# Patient Record
Sex: Female | Born: 1955
Health system: Southern US, Community
[De-identification: ages and names within clinical notes are randomized; demographics above are authoritative.]

## PROBLEM LIST (undated history)

## (undated) DIAGNOSIS — K760 Fatty (change of) liver, not elsewhere classified: Secondary | ICD-10-CM

## (undated) DIAGNOSIS — H269 Unspecified cataract: Secondary | ICD-10-CM

## (undated) DIAGNOSIS — I10 Essential (primary) hypertension: Secondary | ICD-10-CM

## (undated) DIAGNOSIS — E559 Vitamin D deficiency, unspecified: Secondary | ICD-10-CM

## (undated) DIAGNOSIS — K219 Gastro-esophageal reflux disease without esophagitis: Secondary | ICD-10-CM

## (undated) DIAGNOSIS — T8859XA Other complications of anesthesia, initial encounter: Secondary | ICD-10-CM

## (undated) DIAGNOSIS — C801 Malignant (primary) neoplasm, unspecified: Secondary | ICD-10-CM

## (undated) DIAGNOSIS — E7800 Pure hypercholesterolemia, unspecified: Secondary | ICD-10-CM

## (undated) DIAGNOSIS — T4145XA Adverse effect of unspecified anesthetic, initial encounter: Secondary | ICD-10-CM

## (undated) DIAGNOSIS — M1990 Unspecified osteoarthritis, unspecified site: Secondary | ICD-10-CM

## (undated) HISTORY — PX: HERNIA REPAIR: SHX51

## (undated) HISTORY — DX: Fatty (change of) liver, not elsewhere classified: K76.0

## (undated) HISTORY — DX: Unspecified cataract: H26.9

## (undated) HISTORY — DX: Malignant (primary) neoplasm, unspecified: C80.1

## (undated) HISTORY — DX: Vitamin D deficiency, unspecified: E55.9

## (undated) HISTORY — PX: WISDOM TOOTH EXTRACTION: SHX21

## (undated) HISTORY — DX: Essential (primary) hypertension: I10

## (undated) HISTORY — PX: TRAM: SHX5363

## (undated) HISTORY — PX: COLONOSCOPY: SHX174

---

## 1980-12-29 HISTORY — PX: KNEE ARTHROSCOPY: SUR90

## 1993-12-29 HISTORY — PX: MASTECTOMY: SHX3

## 1993-12-29 HISTORY — PX: BREAST SURGERY: SHX581

## 1998-03-14 ENCOUNTER — Ambulatory Visit (HOSPITAL_COMMUNITY): Admission: RE | Admit: 1998-03-14 | Discharge: 1998-03-14 | Payer: Self-pay | Admitting: *Deleted

## 1998-09-21 ENCOUNTER — Inpatient Hospital Stay (HOSPITAL_COMMUNITY): Admission: RE | Admit: 1998-09-21 | Discharge: 1998-09-25 | Payer: Self-pay | Admitting: Plastic Surgery

## 1999-05-01 ENCOUNTER — Inpatient Hospital Stay (HOSPITAL_COMMUNITY): Admission: RE | Admit: 1999-05-01 | Discharge: 1999-05-02 | Payer: Self-pay | Admitting: General Surgery

## 1999-07-22 ENCOUNTER — Ambulatory Visit (HOSPITAL_COMMUNITY): Admission: RE | Admit: 1999-07-22 | Discharge: 1999-07-22 | Payer: Self-pay | Admitting: General Surgery

## 1999-07-22 ENCOUNTER — Encounter: Payer: Self-pay | Admitting: General Surgery

## 1999-12-10 ENCOUNTER — Other Ambulatory Visit: Admission: RE | Admit: 1999-12-10 | Discharge: 1999-12-10 | Payer: Self-pay | Admitting: *Deleted

## 1999-12-18 ENCOUNTER — Encounter: Payer: Self-pay | Admitting: Internal Medicine

## 1999-12-18 ENCOUNTER — Ambulatory Visit (HOSPITAL_COMMUNITY): Admission: RE | Admit: 1999-12-18 | Discharge: 1999-12-18 | Payer: Self-pay | Admitting: Internal Medicine

## 1999-12-25 ENCOUNTER — Encounter: Payer: Self-pay | Admitting: Internal Medicine

## 1999-12-25 ENCOUNTER — Ambulatory Visit (HOSPITAL_COMMUNITY): Admission: RE | Admit: 1999-12-25 | Discharge: 1999-12-25 | Payer: Self-pay | Admitting: Internal Medicine

## 2000-06-24 ENCOUNTER — Encounter: Payer: Self-pay | Admitting: Internal Medicine

## 2000-06-24 ENCOUNTER — Encounter: Admission: RE | Admit: 2000-06-24 | Discharge: 2000-06-24 | Payer: Self-pay | Admitting: Internal Medicine

## 2000-12-23 ENCOUNTER — Other Ambulatory Visit: Admission: RE | Admit: 2000-12-23 | Discharge: 2000-12-23 | Payer: Self-pay | Admitting: *Deleted

## 2001-06-25 ENCOUNTER — Encounter: Payer: Self-pay | Admitting: Internal Medicine

## 2001-06-25 ENCOUNTER — Encounter: Admission: RE | Admit: 2001-06-25 | Discharge: 2001-06-25 | Payer: Self-pay | Admitting: Internal Medicine

## 2001-08-21 ENCOUNTER — Emergency Department (HOSPITAL_COMMUNITY): Admission: EM | Admit: 2001-08-21 | Discharge: 2001-08-21 | Payer: Self-pay | Admitting: Internal Medicine

## 2001-10-31 ENCOUNTER — Emergency Department (HOSPITAL_COMMUNITY): Admission: EM | Admit: 2001-10-31 | Discharge: 2001-10-31 | Payer: Self-pay | Admitting: Emergency Medicine

## 2001-10-31 ENCOUNTER — Encounter: Payer: Self-pay | Admitting: Emergency Medicine

## 2001-11-10 ENCOUNTER — Encounter: Admission: RE | Admit: 2001-11-10 | Discharge: 2001-12-27 | Payer: Self-pay | Admitting: Orthopedic Surgery

## 2002-06-30 ENCOUNTER — Encounter: Admission: RE | Admit: 2002-06-30 | Discharge: 2002-06-30 | Payer: Self-pay | Admitting: *Deleted

## 2002-06-30 ENCOUNTER — Encounter: Payer: Self-pay | Admitting: *Deleted

## 2002-08-12 ENCOUNTER — Encounter: Payer: Self-pay | Admitting: Internal Medicine

## 2002-08-12 ENCOUNTER — Ambulatory Visit (HOSPITAL_COMMUNITY): Admission: RE | Admit: 2002-08-12 | Discharge: 2002-08-12 | Payer: Self-pay | Admitting: Internal Medicine

## 2003-02-20 ENCOUNTER — Encounter: Admission: RE | Admit: 2003-02-20 | Discharge: 2003-02-20 | Payer: Self-pay | Admitting: Obstetrics and Gynecology

## 2003-02-20 ENCOUNTER — Encounter: Payer: Self-pay | Admitting: Obstetrics and Gynecology

## 2003-07-04 ENCOUNTER — Encounter: Payer: Self-pay | Admitting: General Surgery

## 2003-07-04 ENCOUNTER — Encounter: Admission: RE | Admit: 2003-07-04 | Discharge: 2003-07-04 | Payer: Self-pay | Admitting: General Surgery

## 2003-10-23 ENCOUNTER — Ambulatory Visit (HOSPITAL_COMMUNITY): Admission: RE | Admit: 2003-10-23 | Discharge: 2003-10-23 | Payer: Self-pay | Admitting: Internal Medicine

## 2003-10-23 ENCOUNTER — Encounter: Payer: Self-pay | Admitting: Internal Medicine

## 2004-04-08 ENCOUNTER — Ambulatory Visit (HOSPITAL_COMMUNITY): Admission: RE | Admit: 2004-04-08 | Discharge: 2004-04-08 | Payer: Self-pay | Admitting: Internal Medicine

## 2004-07-16 ENCOUNTER — Encounter: Admission: RE | Admit: 2004-07-16 | Discharge: 2004-07-16 | Payer: Self-pay | Admitting: Internal Medicine

## 2005-07-17 ENCOUNTER — Encounter: Admission: RE | Admit: 2005-07-17 | Discharge: 2005-07-17 | Payer: Self-pay | Admitting: Internal Medicine

## 2005-07-17 ENCOUNTER — Ambulatory Visit (HOSPITAL_COMMUNITY): Admission: RE | Admit: 2005-07-17 | Discharge: 2005-07-17 | Payer: Self-pay | Admitting: Internal Medicine

## 2006-01-27 ENCOUNTER — Other Ambulatory Visit: Admission: RE | Admit: 2006-01-27 | Discharge: 2006-01-27 | Payer: Self-pay | Admitting: Obstetrics and Gynecology

## 2006-07-20 ENCOUNTER — Encounter: Admission: RE | Admit: 2006-07-20 | Discharge: 2006-07-20 | Payer: Self-pay | Admitting: Plastic Surgery

## 2006-12-28 ENCOUNTER — Ambulatory Visit (HOSPITAL_COMMUNITY): Admission: RE | Admit: 2006-12-28 | Discharge: 2006-12-28 | Payer: Self-pay | Admitting: Internal Medicine

## 2007-07-22 ENCOUNTER — Encounter: Admission: RE | Admit: 2007-07-22 | Discharge: 2007-07-22 | Payer: Self-pay | Admitting: Internal Medicine

## 2008-07-24 ENCOUNTER — Encounter: Admission: RE | Admit: 2008-07-24 | Discharge: 2008-07-24 | Payer: Self-pay | Admitting: General Surgery

## 2009-05-30 ENCOUNTER — Ambulatory Visit (HOSPITAL_COMMUNITY): Admission: RE | Admit: 2009-05-30 | Discharge: 2009-05-30 | Payer: Self-pay | Admitting: Obstetrics and Gynecology

## 2009-07-25 ENCOUNTER — Encounter: Admission: RE | Admit: 2009-07-25 | Discharge: 2009-07-25 | Payer: Self-pay | Admitting: Internal Medicine

## 2010-07-29 ENCOUNTER — Encounter: Admission: RE | Admit: 2010-07-29 | Discharge: 2010-07-29 | Payer: Self-pay | Admitting: Internal Medicine

## 2011-02-10 ENCOUNTER — Other Ambulatory Visit (HOSPITAL_COMMUNITY): Payer: Self-pay | Admitting: Internal Medicine

## 2011-02-10 ENCOUNTER — Ambulatory Visit (HOSPITAL_COMMUNITY)
Admission: RE | Admit: 2011-02-10 | Discharge: 2011-02-10 | Disposition: A | Discharge status: Home / Self Care | Payer: BC Managed Care – PPO | Source: Ambulatory Visit | Attending: Internal Medicine | Admitting: Internal Medicine

## 2011-02-10 DIAGNOSIS — M25519 Pain in unspecified shoulder: Secondary | ICD-10-CM

## 2011-02-10 DIAGNOSIS — Z853 Personal history of malignant neoplasm of breast: Secondary | ICD-10-CM

## 2011-02-10 DIAGNOSIS — Z0000 Encounter for general adult medical examination without abnormal findings: Secondary | ICD-10-CM | POA: Insufficient documentation

## 2011-04-29 LAB — HM COLONOSCOPY

## 2011-07-29 ENCOUNTER — Other Ambulatory Visit: Payer: Self-pay | Admitting: Obstetrics and Gynecology

## 2011-07-29 DIAGNOSIS — Z1231 Encounter for screening mammogram for malignant neoplasm of breast: Secondary | ICD-10-CM

## 2011-08-05 ENCOUNTER — Ambulatory Visit
Admission: RE | Admit: 2011-08-05 | Discharge: 2011-08-05 | Disposition: A | Discharge status: Home / Self Care | Payer: BC Managed Care – PPO | Source: Ambulatory Visit | Attending: Obstetrics and Gynecology | Admitting: Obstetrics and Gynecology

## 2011-08-05 DIAGNOSIS — Z1231 Encounter for screening mammogram for malignant neoplasm of breast: Secondary | ICD-10-CM

## 2012-03-16 ENCOUNTER — Other Ambulatory Visit: Payer: Self-pay | Admitting: Internal Medicine

## 2012-03-16 DIAGNOSIS — R1011 Right upper quadrant pain: Secondary | ICD-10-CM

## 2012-03-18 ENCOUNTER — Other Ambulatory Visit: Payer: Self-pay | Admitting: Internal Medicine

## 2012-03-18 ENCOUNTER — Ambulatory Visit
Admission: RE | Admit: 2012-03-18 | Discharge: 2012-03-18 | Disposition: A | Discharge status: Home / Self Care | Payer: BC Managed Care – PPO | Source: Ambulatory Visit | Attending: Internal Medicine | Admitting: Internal Medicine

## 2012-03-18 DIAGNOSIS — R1011 Right upper quadrant pain: Secondary | ICD-10-CM

## 2012-03-18 DIAGNOSIS — R52 Pain, unspecified: Secondary | ICD-10-CM

## 2012-07-12 ENCOUNTER — Other Ambulatory Visit: Payer: Self-pay | Admitting: Obstetrics and Gynecology

## 2012-07-12 DIAGNOSIS — Z1231 Encounter for screening mammogram for malignant neoplasm of breast: Secondary | ICD-10-CM

## 2012-08-05 ENCOUNTER — Ambulatory Visit
Admission: RE | Admit: 2012-08-05 | Discharge: 2012-08-05 | Disposition: A | Discharge status: Home / Self Care | Payer: BC Managed Care – PPO | Source: Ambulatory Visit | Attending: Obstetrics and Gynecology | Admitting: Obstetrics and Gynecology

## 2012-08-05 DIAGNOSIS — Z1231 Encounter for screening mammogram for malignant neoplasm of breast: Secondary | ICD-10-CM

## 2012-10-04 ENCOUNTER — Encounter (HOSPITAL_COMMUNITY): Payer: Self-pay | Admitting: Emergency Medicine

## 2012-10-04 ENCOUNTER — Emergency Department (HOSPITAL_COMMUNITY)
Admission: EM | Admit: 2012-10-04 | Discharge: 2012-10-05 | Disposition: A | Discharge status: Home / Self Care | Payer: BC Managed Care – PPO | Attending: Emergency Medicine | Admitting: Emergency Medicine

## 2012-10-04 ENCOUNTER — Emergency Department (HOSPITAL_COMMUNITY): Payer: BC Managed Care – PPO

## 2012-10-04 DIAGNOSIS — K8020 Calculus of gallbladder without cholecystitis without obstruction: Secondary | ICD-10-CM | POA: Insufficient documentation

## 2012-10-04 DIAGNOSIS — R5383 Other fatigue: Secondary | ICD-10-CM | POA: Insufficient documentation

## 2012-10-04 DIAGNOSIS — R5381 Other malaise: Secondary | ICD-10-CM | POA: Insufficient documentation

## 2012-10-04 DIAGNOSIS — R6883 Chills (without fever): Secondary | ICD-10-CM | POA: Insufficient documentation

## 2012-10-04 DIAGNOSIS — R112 Nausea with vomiting, unspecified: Secondary | ICD-10-CM | POA: Insufficient documentation

## 2012-10-04 DIAGNOSIS — R109 Unspecified abdominal pain: Secondary | ICD-10-CM

## 2012-10-04 HISTORY — DX: Pure hypercholesterolemia, unspecified: E78.00

## 2012-10-04 HISTORY — DX: Gastro-esophageal reflux disease without esophagitis: K21.9

## 2012-10-04 LAB — CBC WITH DIFFERENTIAL/PLATELET
Basophils Absolute: 0 10*3/uL (ref 0.0–0.1)
Basophils Relative: 0 % (ref 0–1)
Eosinophils Absolute: 0.1 10*3/uL (ref 0.0–0.7)
Lymphocytes Relative: 13 % (ref 12–46)
MCV: 87.4 fL (ref 78.0–100.0)
Monocytes Absolute: 0.7 10*3/uL (ref 0.1–1.0)
Monocytes Relative: 6 % (ref 3–12)
Neutro Abs: 9.6 10*3/uL — ABNORMAL HIGH (ref 1.7–7.7)
Neutrophils Relative %: 79 % — ABNORMAL HIGH (ref 43–77)
Platelets: 236 10*3/uL (ref 150–400)
RBC: 4.28 MIL/uL (ref 3.87–5.11)

## 2012-10-04 LAB — COMPREHENSIVE METABOLIC PANEL
ALT: 37 U/L — ABNORMAL HIGH (ref 0–35)
AST: 60 U/L — ABNORMAL HIGH (ref 0–37)
Albumin: 4.2 g/dL (ref 3.5–5.2)
Alkaline Phosphatase: 63 U/L (ref 39–117)
GFR calc non Af Amer: 74 mL/min — ABNORMAL LOW (ref >90)
Total Bilirubin: 0.4 mg/dL (ref 0.3–1.2)
Total Protein: 7.5 g/dL (ref 6.0–8.3)

## 2012-10-04 LAB — URINALYSIS, ROUTINE W REFLEX MICROSCOPIC
Bilirubin Urine: NEGATIVE
Glucose, UA: NEGATIVE mg/dL
Hgb urine dipstick: NEGATIVE
Ketones, ur: NEGATIVE mg/dL
Nitrite: NEGATIVE
Specific Gravity, Urine: 1.017 (ref 1.005–1.030)
pH: 7 (ref 5.0–8.0)

## 2012-10-04 LAB — LACTIC ACID, PLASMA: Lactic Acid, Venous: 1.5 mmol/L (ref 0.5–2.2)

## 2012-10-04 LAB — URINE MICROSCOPIC-ADD ON

## 2012-10-04 MED ORDER — ONDANSETRON HCL 4 MG/2ML IJ SOLN
4.0000 mg | Freq: Once | INTRAMUSCULAR | Status: DC
  Filled 2012-10-04: qty 2

## 2012-10-04 MED ORDER — MORPHINE SULFATE 4 MG/ML IJ SOLN
4.0000 mg | Freq: Once | INTRAMUSCULAR | Status: DC
  Filled 2012-10-04: qty 1

## 2012-10-04 MED ORDER — PANTOPRAZOLE SODIUM 40 MG IV SOLR
40.0000 mg | Freq: Once | INTRAVENOUS | Status: AC
  Administered 2012-10-04: 40 mg via INTRAVENOUS
  Filled 2012-10-04: qty 40

## 2012-10-04 MED ORDER — OXYCODONE-ACETAMINOPHEN 5-325 MG PO TABS: 2.0000 | ORAL_TABLET | ORAL | Status: DC | PRN

## 2012-10-04 MED ORDER — IOHEXOL 300 MG/ML  SOLN
100.0000 mL | Freq: Once | INTRAMUSCULAR | Status: AC | PRN
  Administered 2012-10-04: 100 mL via INTRAVENOUS

## 2012-10-04 NOTE — ED Notes (Signed)
Patient declined ondansetron and morphine at this time.  I advised her to let me know if she changes her mind.

## 2012-10-04 NOTE — ED Notes (Signed)
Unable to start IV by several nurses.   IV team notified.  Moving patient to CDU 9  Contrast completed CT not notified of contrast completion.

## 2012-10-04 NOTE — ED Provider Notes (Signed)
11:38 PM Patient sent to the CDU pending abdominal CT results by Dr. Bebe Shaggy. Patient's CT abdomen and pelvis is negative for any acute process. Patient is not satisfied with the results and would like to go home. I will discharge her with instructions to follow up with her PCP or GI specialist as needed and pain medication.   Jenna Miller, [place] 10/04/12 2339

## 2012-10-04 NOTE — ED Provider Notes (Signed)
History     CSN: [id number]  Arrival date & time 10/04/12  1351   First MD Initiated Contact with Patient 10/04/12 1941     Chief complaint - abdominal pain  Patient is a 56 y.o. female presenting with abdominal pain. The history is provided by the patient.  Abdominal Pain The primary symptoms of the illness include abdominal pain, fatigue and nausea. The primary symptoms of the illness do not include fever, vomiting, diarrhea or hematemesis. Episode onset: several hours ago. The onset of the illness was sudden. The problem has been gradually improving.  The illness is associated with eating. Additional symptoms associated with the illness include chills. Symptoms associated with the illness do not include constipation.  pt reports that she had sudden onset of abdominal pain/cramping earlier today, this occurred soon after eating a salad.  She reports this was severe and last for several hours and is now improving She reports having these episodes previously, but this was the worst episode She did have BM this morning but was small   No cp/sob reported No dysuria reported No back pain reported No focal weakness  Past Medical History  Diagnosis Date  . High cholesterol   . Acid reflux     Past surgical - reports multiple abdominal surgeries No family history on file.  History  Substance Use Topics  . Smoking status: Never Smoker   . Smokeless tobacco: Not on file  . Alcohol Use: Yes    OB History    Grav Para Term Preterm Abortions TAB SAB Ect Mult Living                  Review of Systems  Constitutional: Positive for chills and fatigue. Negative for fever.  Gastrointestinal: Positive for nausea and abdominal pain. Negative for vomiting, diarrhea, constipation and hematemesis.  All other systems reviewed and are negative.    Allergies  Lipitor  Home Medications   Current Outpatient Rx  Name Route Sig Dispense Refill  . EZETIMIBE 10 MG PO TABS Oral Take 10 mg  by mouth daily.    Marland Kitchen GEMFIBROZIL 600 MG PO TABS Oral Take 600 mg by mouth daily.    Marland Kitchen HYDROCHLOROTHIAZIDE 25 MG PO TABS Oral Take 25 mg by mouth daily.    Marland Kitchen PANTOPRAZOLE SODIUM 40 MG PO TBEC Oral Take 40 mg by mouth 2 (two) times daily.    . SUCRALFATE 1 G PO TABS Oral Take 1 g by mouth 4 (four) times daily.      BP 130/79  Pulse 65  Temp 97.5 F (36.4 C) (Oral)  Resp 18  SpO2 98%  LMP 06/10/2010 BP 133/63  Pulse 70  Temp 98.6 F (37 C) (Oral)  Resp 20  Ht 5' 1.5" (1.562 m)  Wt 178 lb (80.74 kg)  BMI 33.09 kg/m2  SpO2 99%  LMP 06/10/2010  Physical Exam CONSTITUTIONAL: Well developed/well nourished HEAD AND FACE: Normocephalic/atraumatic EYES: EOMI/PERRL, no icterus ENMT: Mucous membranes moist NECK: supple no meningeal signs SPINE:entire spine nontender CV: S1/S2 noted, no murmurs/rubs/gallops noted LUNGS: Lungs are clear to auscultation bilaterally, no apparent distress ABDOMEN: soft, diffuse tenderness, no rebound or guarding, tenderness is moderate GU:no cva tenderness NEURO: Pt is awake/alert, moves all extremitiesx4 EXTREMITIES: pulses normal, full ROM SKIN: warm, color normal PSYCH: no abnormalities of mood noted  ED Course  Procedures   Labs Reviewed  CBC WITH DIFFERENTIAL - Abnormal; Notable for the following:    WBC 12.1 (*)     Neutrophils  Relative 79 (*)     Neutro Abs 9.6 (*)     All other components within normal limits  COMPREHENSIVE METABOLIC PANEL - Abnormal; Notable for the following:    Sodium 134 (*)     Glucose, Bld 114 (*)     BUN 26 (*)     AST 60 (*)     ALT 37 (*)     GFR calc non Af Amer 74 (*)     GFR calc Af Amer 85 (*)     All other components within normal limits  LIPASE, BLOOD  LACTIC ACID, PLASMA  URINALYSIS, ROUTINE W REFLEX MICROSCOPIC   10:36 PM Pt with diffuse abd cramping that is improving but is still present.  She reports feeling distended as well.  CT imaging recommend given her history/exam.  Pt agreeable.    Pt  told me she had recent abdominal ultrasound that did not show gallbladder disease.  Her CT shows cholelithiasis without cholecystitis.  She is improved and can f/u with gen surgery as I suspect she had biliary colic that is improved.    MDM  Nursing notes including past medical history and social history reviewed and considered in documentation Labs/vital reviewed and considered        Date: 10/04/2012  Rate: 63  Rhythm: normal sinus rhythm  QRS Axis: normal  Intervals: normal  ST/T Wave abnormalities: nonspecific ST changes  Conduction Disutrbances:none     Joya Gaskins, MD 10/04/12 2350

## 2012-10-04 NOTE — ED Notes (Signed)
IV attempted x 4, IV team paged.

## 2012-10-04 NOTE — ED Notes (Signed)
Was having lunch  salad started to have cramps and felt upper abd pain  No n/v just feels like on fire on inside

## 2012-10-04 NOTE — ED Notes (Addendum)
Pt has expressed that she is very upset about waiting for 5 hours in the waiting room. sympathized with patient. sts that she doesn't want to get in a gown until she sees the doctor and knows a plan. Pt sitting in chair

## 2012-10-04 NOTE — ED Notes (Signed)
Pt informed of extended wait time. Pt not happy-requesting to speak with someone in charge. Andra, Pt advocate notified.
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
## 2012-10-05 NOTE — ED Notes (Signed)
The patient is AOx4 and comfortable with her discharge instructions. 

## 2012-10-07 NOTE — ED Provider Notes (Signed)
Medical screening examination/treatment/procedure(s) were conducted as a shared visit with non-physician practitioner(s) and myself.  I personally evaluated the patient during the encounter   Joya Gaskins, MD 10/07/12 0030

## 2012-10-20 ENCOUNTER — Encounter (INDEPENDENT_AMBULATORY_CARE_PROVIDER_SITE_OTHER): Payer: Self-pay | Admitting: General Surgery

## 2012-10-20 ENCOUNTER — Ambulatory Visit (INDEPENDENT_AMBULATORY_CARE_PROVIDER_SITE_OTHER): Payer: BC Managed Care – PPO | Admitting: General Surgery

## 2012-10-20 VITALS — BP 110/60 | HR 72 | Temp 96.9°F | Ht 61.5 in | Wt 177.2 lb

## 2012-10-20 DIAGNOSIS — K8020 Calculus of gallbladder without cholecystitis without obstruction: Secondary | ICD-10-CM

## 2012-10-20 NOTE — Progress Notes (Signed)
Patient ID: Jenna Miller, female   DOB: 01/24/1956, 56 y.o.   MRN: [id number]  Chief Complaint  Patient presents with  . Pre-op Exam    eval GB with stones    HPI Jenna Miller is a 56 y.o. female.  Chief complaint right upper quadrant abdominal pain HPI  Patient has had episodic right upper quadrant abdominal pain for approximately 6 months. She also had increased symptoms of reflux. Her gastroenterologist increased her Protonix dose. She had one severe attack of right upper quadrant pain earlier this month. She was evaluated at the emergency department with CT scan. This revealed gallstones but no evidence of cholecystitis at that time. Since then, she has been watching her diet closely and has avoided further attacks. When the attacks came previously they included right upper quadrant pain extending around to her back and also toward her epigastrium.  Past Medical History  Diagnosis Date  . High cholesterol   . Acid reflux   . Cancer     Past Surgical History  Procedure Date  . Breast surgery 1995    mastectomy left  . Tram 1999-2000  . Knee arthroscopy 1982  . Wisdom tooth extraction   Essure wire in fallopian tubes  Family History  Problem Relation Age of Onset  . Cancer Cousin     lung    Social History History  Substance Use Topics  . Smoking status: Never Smoker   . Smokeless tobacco: Not on file  . Alcohol Use: Yes     rare    Allergies  Allergen Reactions  . Lipitor (Atorvastatin)     Current Outpatient Prescriptions  Medication Sig Dispense Refill  . ezetimibe (ZETIA) 10 MG tablet Take 10 mg by mouth daily.      . gemfibrozil (LOPID) 600 MG tablet Take 600 mg by mouth daily.      . hydrochlorothiazide (HYDRODIURIL) 25 MG tablet Take 25 mg by mouth daily.      . oxyCODONE-acetaminophen (PERCOCET/ROXICET) 5-325 MG per tablet Take 2 tablets by mouth every 4 (four) hours as needed for pain.  15 tablet  0  . pantoprazole (PROTONIX) 40 MG tablet  Take 40 mg by mouth 2 (two) times daily.      . sucralfate (CARAFATE) 1 G tablet Take 1 g by mouth 4 (four) times daily.        Review of Systems Review of Systems  Constitutional: Negative for fever, chills and unexpected weight change.  HENT: Negative for hearing loss, congestion, sore throat, trouble swallowing and voice change.   Eyes: Negative for visual disturbance.  Respiratory: Negative for cough and wheezing.   Cardiovascular: Negative for chest pain, palpitations and leg swelling.  Gastrointestinal: Positive for abdominal pain. Negative for nausea, vomiting, diarrhea, constipation, blood in stool, abdominal distention and anal bleeding.       See history of present illness  Genitourinary: Negative for hematuria, vaginal bleeding and difficulty urinating.  Musculoskeletal: Negative for arthralgias.  Skin: Negative for rash and wound.  Neurological: Negative for seizures, syncope and headaches.  Hematological: Negative for adenopathy. Does not bruise/bleed easily.  Psychiatric/Behavioral: Negative for confusion.    Blood pressure 110/60, pulse 72, temperature 96.9 F (36.1 C), temperature source Temporal, height 5' 1.5" (1.562 m), weight 177 lb 3.2 oz (80.377 kg), last menstrual period 06/10/2010, SpO2 96.00%.  Physical Exam Physical Exam  Constitutional: She is oriented to person, place, and time. She appears well-developed and well-nourished.  HENT:  Head: Normocephalic and atraumatic.    Eyes: EOM are normal. Pupils are equal, round, and reactive to light. No scleral icterus.  Neck: Normal range of motion. Neck supple. No tracheal deviation present.  Cardiovascular: Normal rate, regular rhythm, normal heart sounds and intact distal pulses.   Pulmonary/Chest: Effort normal and breath sounds normal. No stridor. No respiratory distress. She has no wheezes. She has no rales.  Abdominal: Soft. Bowel sounds are normal. She exhibits no distension. There is no tenderness. There is  no rebound and no guarding.  Musculoskeletal: Normal range of motion.  Neurological: She is alert and oriented to person, place, and time.  Skin: Skin is warm and dry.    Data Reviewed Radiology reports  Assessment    Symptomatic cholelithiasis    Plan    I have offered laparoscopic cholecystectomy.I discussed the procedure in detail.  The patient was given educational material.  We discussed the risks and benefits of a laparoscopic cholecystectomy and possible cholangiogram including, but not limited to bleeding, infection, injury to surrounding structures such as the intestine or liver, bile leak, retained gallstones, need to convert to an open procedure, prolonged diarrhea, blood clots such as  DVT, common bile duct injury, anesthesia risks, and possible need for additional procedures.  The likelihood of improvement in symptoms and return to the patient's normal status is good. We discussed the typical post-operative recovery course.  [name] In light of her previous TRAM surgery. I will speak with him tomorrow and call her back. At that point she will proceed with scheduling.       Leesa Leifheit E 10/20/2012, 9:58 AM    

## 2012-10-21 ENCOUNTER — Other Ambulatory Visit (INDEPENDENT_AMBULATORY_CARE_PROVIDER_SITE_OTHER): Payer: Self-pay | Admitting: General Surgery

## 2012-10-22 ENCOUNTER — Telehealth: Payer: Self-pay | Admitting: General Surgery

## 2012-10-22 NOTE — Telephone Encounter (Signed)
I spoke to Dr. Odis Luster per patient's request and he feels lap chole should be no problem at this time.  I called the patient and she now wants to schedule for next month.

## 2012-11-10 ENCOUNTER — Encounter (HOSPITAL_COMMUNITY): Payer: Self-pay | Admitting: Pharmacy Technician

## 2012-11-10 NOTE — Pre-Procedure Instructions (Signed)
20 Veronique Kearley Gorney  11/10/2012   Your procedure is scheduled on:  Fri, Nov 22 @ 10:00 AM  Report to [facility] at 8:00 AM.  Call this number if you have problems the morning of surgery: [phone]   Remember:DO NOT EAT OR DRINK ANYTHING AFTER MIDNIGHT     Take these medicines the morning of surgery with A SIP OF WATER: Pantoprazole(Protonix)   Do not wear jewelry, make-up or nail polish.  Do not wear lotions, powders, or perfumes. You may wear deodorant.  Men may shave face and neck.  Do not bring valuables to the hospital.  Contacts, dentures or bridgework may not be worn into surgery.  Leave suitcase in the car. After surgery it may be brought to your room.  For patients admitted to the hospital, checkout time is 11:00 AM the day of discharge.   Patients discharged the day of surgery will not be allowed to drive home.    Special Instructions: Shower using CHG 2 nights before surgery and the night before surgery.  If you shower the day of surgery use CHG.  Use special wash - you have one bottle of CHG for all showers.  You should use approximately 1/3 of the bottle for each shower.   Please read over the following fact sheets that you were given: Pain Booklet, Coughing and Deep Breathing, MRSA Information and Surgical Site Infection Prevention

## 2012-11-11 ENCOUNTER — Encounter (HOSPITAL_COMMUNITY)
Admission: RE | Admit: 2012-11-11 | Discharge: 2012-11-11 | Disposition: A | Discharge status: Home / Self Care | Payer: BC Managed Care – PPO | Source: Ambulatory Visit | Attending: General Surgery | Admitting: General Surgery

## 2012-11-11 ENCOUNTER — Encounter (HOSPITAL_COMMUNITY): Payer: Self-pay

## 2012-11-11 HISTORY — DX: Adverse effect of unspecified anesthetic, initial encounter: T41.45XA

## 2012-11-11 HISTORY — DX: Other complications of anesthesia, initial encounter: T88.59XA

## 2012-11-11 HISTORY — DX: Unspecified osteoarthritis, unspecified site: M19.90

## 2012-11-11 LAB — CBC
Hemoglobin: 13 g/dL (ref 12.0–15.0)
MCH: 29.8 pg (ref 26.0–34.0)
MCV: 87.6 fL (ref 78.0–100.0)
Platelets: 281 10*3/uL (ref 150–400)
RBC: 4.36 MIL/uL (ref 3.87–5.11)
WBC: 5.1 10*3/uL (ref 4.0–10.5)

## 2012-11-11 LAB — COMPREHENSIVE METABOLIC PANEL
AST: 21 U/L (ref 0–37)
Albumin: 4.3 g/dL (ref 3.5–5.2)
BUN: 19 mg/dL (ref 6–23)
Calcium: 10.4 mg/dL (ref 8.4–10.5)
Chloride: 103 mEq/L (ref 96–112)
Creatinine, Ser: 0.87 mg/dL (ref 0.50–1.10)
Total Bilirubin: 0.4 mg/dL (ref 0.3–1.2)

## 2012-11-11 LAB — SURGICAL PCR SCREEN
MRSA, PCR: NEGATIVE
Staphylococcus aureus: NEGATIVE

## 2012-11-18 MED ORDER — CEFAZOLIN SODIUM-DEXTROSE 2-3 GM-% IV SOLR
2.0000 g | INTRAVENOUS | Status: AC
  Administered 2012-11-19: 2 g via INTRAVENOUS
  Filled 2012-11-18: qty 50

## 2012-11-19 ENCOUNTER — Ambulatory Visit (HOSPITAL_COMMUNITY): Payer: BC Managed Care – PPO

## 2012-11-19 ENCOUNTER — Encounter (HOSPITAL_COMMUNITY): Payer: Self-pay | Admitting: Anesthesiology

## 2012-11-19 ENCOUNTER — Ambulatory Visit (HOSPITAL_COMMUNITY): Payer: BC Managed Care – PPO | Admitting: Anesthesiology

## 2012-11-19 ENCOUNTER — Ambulatory Visit (HOSPITAL_COMMUNITY)
Admission: RE | Admit: 2012-11-19 | Discharge: 2012-11-19 | Disposition: A | Discharge status: Home / Self Care | Payer: BC Managed Care – PPO | Source: Ambulatory Visit | Attending: General Surgery | Admitting: General Surgery

## 2012-11-19 ENCOUNTER — Encounter (HOSPITAL_COMMUNITY): Payer: Self-pay | Admitting: *Deleted

## 2012-11-19 ENCOUNTER — Encounter (HOSPITAL_COMMUNITY)
Admission: RE | Disposition: A | Discharge status: Home / Self Care | Payer: Self-pay | Source: Ambulatory Visit | Attending: General Surgery

## 2012-11-19 DIAGNOSIS — K8020 Calculus of gallbladder without cholecystitis without obstruction: Secondary | ICD-10-CM | POA: Insufficient documentation

## 2012-11-19 DIAGNOSIS — E7800 Pure hypercholesterolemia, unspecified: Secondary | ICD-10-CM | POA: Insufficient documentation

## 2012-11-19 DIAGNOSIS — K219 Gastro-esophageal reflux disease without esophagitis: Secondary | ICD-10-CM | POA: Insufficient documentation

## 2012-11-19 DIAGNOSIS — K8010 Calculus of gallbladder with chronic cholecystitis without obstruction: Secondary | ICD-10-CM

## 2012-11-19 DIAGNOSIS — Z859 Personal history of malignant neoplasm, unspecified: Secondary | ICD-10-CM | POA: Insufficient documentation

## 2012-11-19 HISTORY — PX: CHOLECYSTECTOMY: SHX55

## 2012-11-19 SURGERY — LAPAROSCOPIC CHOLECYSTECTOMY WITH INTRAOPERATIVE CHOLANGIOGRAM
Anesthesia: General | Site: Abdomen | Wound class: Contaminated

## 2012-11-19 MED ORDER — SODIUM CHLORIDE 0.9 % IV SOLN
Freq: Once | INTRAVENOUS | Status: DC
  Filled 2012-11-19 (×2): qty 50

## 2012-11-19 MED ORDER — SODIUM CHLORIDE 0.9 % IR SOLN
Status: DC | PRN
  Administered 2012-11-19: 1000 mL

## 2012-11-19 MED ORDER — SODIUM CHLORIDE 0.9 % IV SOLN
INTRAVENOUS | Status: DC | PRN
  Administered 2012-11-19: 11:00:00

## 2012-11-19 MED ORDER — BUPIVACAINE-EPINEPHRINE PF 0.25-1:200000 % IJ SOLN
INTRAMUSCULAR | Status: AC
  Filled 2012-11-19: qty 30

## 2012-11-19 MED ORDER — OXYCODONE HCL 5 MG PO TABS
5.0000 mg | ORAL_TABLET | Freq: Once | ORAL | Status: AC | PRN
  Administered 2012-11-19: 5 mg via ORAL

## 2012-11-19 MED ORDER — OXYCODONE-ACETAMINOPHEN 5-325 MG PO TABS: 1.0000 | ORAL_TABLET | Freq: Four times a day (QID) | ORAL | Status: DC | PRN

## 2012-11-19 MED ORDER — ARTIFICIAL TEARS OP OINT
TOPICAL_OINTMENT | OPHTHALMIC | Status: DC | PRN
  Administered 2012-11-19: 1 via OPHTHALMIC

## 2012-11-19 MED ORDER — HYDROMORPHONE HCL PF 1 MG/ML IJ SOLN
0.2500 mg | INTRAMUSCULAR | Status: DC | PRN
  Administered 2012-11-19 (×4): 0.5 mg via INTRAVENOUS
  Filled 2012-11-19: qty 1

## 2012-11-19 MED ORDER — LACTATED RINGERS IV SOLN
INTRAVENOUS | Status: DC
  Administered 2012-11-19: 09:00:00 via INTRAVENOUS

## 2012-11-19 MED ORDER — MEPERIDINE HCL 25 MG/ML IJ SOLN: 6.2500 mg | INTRAMUSCULAR | Status: DC | PRN

## 2012-11-19 MED ORDER — LIDOCAINE HCL (CARDIAC) 20 MG/ML IV SOLN
INTRAVENOUS | Status: DC | PRN
  Administered 2012-11-19: 80 mg via INTRAVENOUS

## 2012-11-19 MED ORDER — OXYCODONE HCL 5 MG PO TABS
ORAL_TABLET | ORAL | Status: AC
  Filled 2012-11-19: qty 1

## 2012-11-19 MED ORDER — LACTATED RINGERS IV SOLN
INTRAVENOUS | Status: DC | PRN
  Administered 2012-11-19: 10:00:00 via INTRAVENOUS

## 2012-11-19 MED ORDER — HYDROMORPHONE HCL PF 1 MG/ML IJ SOLN
INTRAMUSCULAR | Status: AC
  Administered 2012-11-19: 0.5 mg via INTRAVENOUS
  Filled 2012-11-19: qty 1

## 2012-11-19 MED ORDER — BUPIVACAINE-EPINEPHRINE 0.25% -1:200000 IJ SOLN
INTRAMUSCULAR | Status: DC | PRN
  Administered 2012-11-19: 30 mL

## 2012-11-19 MED ORDER — GLYCOPYRROLATE 0.2 MG/ML IJ SOLN
INTRAMUSCULAR | Status: DC | PRN
  Administered 2012-11-19 (×2): 0.4 mg via INTRAVENOUS

## 2012-11-19 MED ORDER — ONDANSETRON HCL 4 MG/2ML IJ SOLN
INTRAMUSCULAR | Status: AC
  Administered 2012-11-19: 4 mg
  Filled 2012-11-19: qty 2

## 2012-11-19 MED ORDER — MIDAZOLAM HCL 5 MG/5ML IJ SOLN
INTRAMUSCULAR | Status: DC | PRN
  Administered 2012-11-19 (×2): 1 mg via INTRAVENOUS

## 2012-11-19 MED ORDER — PROPOFOL 10 MG/ML IV BOLUS
INTRAVENOUS | Status: DC | PRN
  Administered 2012-11-19: 200 mg via INTRAVENOUS

## 2012-11-19 MED ORDER — ONDANSETRON HCL 4 MG/2ML IJ SOLN
INTRAMUSCULAR | Status: DC | PRN
  Administered 2012-11-19: 4 mg via INTRAVENOUS

## 2012-11-19 MED ORDER — ROCURONIUM BROMIDE 100 MG/10ML IV SOLN
INTRAVENOUS | Status: DC | PRN
  Administered 2012-11-19: 40 mg via INTRAVENOUS

## 2012-11-19 MED ORDER — SUCCINYLCHOLINE CHLORIDE 20 MG/ML IJ SOLN
INTRAMUSCULAR | Status: DC | PRN
  Administered 2012-11-19: 100 mg via INTRAVENOUS

## 2012-11-19 MED ORDER — NEOSTIGMINE METHYLSULFATE 1 MG/ML IJ SOLN
INTRAMUSCULAR | Status: DC | PRN
  Administered 2012-11-19: 3 mg via INTRAVENOUS
  Administered 2012-11-19: 2 mg via INTRAVENOUS

## 2012-11-19 MED ORDER — FAMOTIDINE IN NACL 20-0.9 MG/50ML-% IV SOLN
20.0000 mg | Freq: Once | INTRAVENOUS | Status: AC
  Administered 2012-11-19: 20 mg via INTRAVENOUS
  Filled 2012-11-19: qty 50

## 2012-11-19 MED ORDER — PROMETHAZINE HCL 25 MG/ML IJ SOLN: 6.2500 mg | INTRAMUSCULAR | Status: DC | PRN

## 2012-11-19 MED ORDER — FENTANYL CITRATE 0.05 MG/ML IJ SOLN
INTRAMUSCULAR | Status: DC | PRN
  Administered 2012-11-19 (×6): 50 ug via INTRAVENOUS

## 2012-11-19 MED ORDER — OXYCODONE HCL 5 MG/5ML PO SOLN: 5.0000 mg | Freq: Once | ORAL | Status: AC | PRN

## 2012-11-19 MED ORDER — RANITIDINE HCL IN NACL 50-0.45 MG/50ML-% IV SOLN: 50.0000 mg | INTRAVENOUS | Status: DC

## 2012-11-19 SURGICAL SUPPLY — 43 items
ADH SKN CLS APL DERMABOND .7 (GAUZE/BANDAGES/DRESSINGS) ×1
APPLIER CLIP 5 13 M/L LIGAMAX5 (MISCELLANEOUS) ×4
APPLIER CLIP ROT 10 11.4 M/L (STAPLE)
APR CLP MED LRG 11.4X10 (STAPLE)
APR CLP MED LRG 5 ANG JAW (MISCELLANEOUS) ×2
BAG SPEC RTRVL LRG 6X4 10 (ENDOMECHANICALS) ×1
BLADE SURG ROTATE 9660 (MISCELLANEOUS) IMPLANT
CANISTER SUCTION 2500CC (MISCELLANEOUS) ×2 IMPLANT
CHLORAPREP W/TINT 26ML (MISCELLANEOUS) ×2 IMPLANT
CLIP APPLIE 5 13 M/L LIGAMAX5 (MISCELLANEOUS) ×1 IMPLANT
CLIP APPLIE ROT 10 11.4 M/L (STAPLE) IMPLANT
CLOTH BEACON ORANGE TIMEOUT ST (SAFETY) ×2 IMPLANT
COVER MAYO STAND STRL (DRAPES) ×2 IMPLANT
COVER SURGICAL LIGHT HANDLE (MISCELLANEOUS) ×2 IMPLANT
DECANTER SPIKE VIAL GLASS SM (MISCELLANEOUS) ×4 IMPLANT
DERMABOND ADVANCED (GAUZE/BANDAGES/DRESSINGS) ×1
DERMABOND ADVANCED .7 DNX12 (GAUZE/BANDAGES/DRESSINGS) ×1 IMPLANT
DRAPE C-ARM 42X72 X-RAY (DRAPES) ×2 IMPLANT
DRAPE UTILITY 15X26 W/TAPE STR (DRAPE) ×6 IMPLANT
ELECT REM PT RETURN 9FT ADLT (ELECTROSURGICAL) ×2
ELECTRODE REM PT RTRN 9FT ADLT (ELECTROSURGICAL) ×1 IMPLANT
FILTER SMOKE EVAC LAPAROSHD (FILTER) IMPLANT
GLOVE BIO SURGEON STRL SZ8 (GLOVE) ×2 IMPLANT
GLOVE BIOGEL PI IND STRL 8 (GLOVE) ×1 IMPLANT
GLOVE BIOGEL PI INDICATOR 8 (GLOVE) ×1
GOWN PREVENTION PLUS XLARGE (GOWN DISPOSABLE) ×2 IMPLANT
GOWN STRL NON-REIN LRG LVL3 (GOWN DISPOSABLE) ×6 IMPLANT
KIT BASIN OR (CUSTOM PROCEDURE TRAY) ×2 IMPLANT
KIT ROOM TURNOVER OR (KITS) ×2 IMPLANT
NS IRRIG 1000ML POUR BTL (IV SOLUTION) ×2 IMPLANT
PAD ARMBOARD 7.5X6 YLW CONV (MISCELLANEOUS) ×2 IMPLANT
POUCH SPECIMEN RETRIEVAL 10MM (ENDOMECHANICALS) ×2 IMPLANT
SCISSORS LAP 5X35 DISP (ENDOMECHANICALS) ×1 IMPLANT
SET CHOLANGIOGRAPH 5 50 .035 (SET/KITS/TRAYS/PACK) ×2 IMPLANT
SET IRRIG TUBING LAPAROSCOPIC (IRRIGATION / IRRIGATOR) ×2 IMPLANT
SPECIMEN JAR SMALL (MISCELLANEOUS) ×2 IMPLANT
SUT VIC AB 4-0 PS2 27 (SUTURE) ×2 IMPLANT
TOWEL OR 17X24 6PK STRL BLUE (TOWEL DISPOSABLE) ×2 IMPLANT
TOWEL OR 17X26 10 PK STRL BLUE (TOWEL DISPOSABLE) ×2 IMPLANT
TRAY LAPAROSCOPIC (CUSTOM PROCEDURE TRAY) ×2 IMPLANT
TROCAR HASSON GELL 12X100 (TROCAR) ×2 IMPLANT
TROCAR Z-THREAD FIOS 5X100MM (TROCAR) ×6 IMPLANT
WATER STERILE IRR 1000ML POUR (IV SOLUTION) IMPLANT

## 2012-11-19 NOTE — Anesthesia Preprocedure Evaluation (Signed)
Anesthesia Evaluation  Patient identified by MRN, date of birth, ID band  Airway Mallampati: I      Dental   Pulmonary neg pulmonary ROS,  breath sounds clear to auscultation        Cardiovascular negative cardio ROS  Rhythm:Regular Rate:Normal     Neuro/Psych    GI/Hepatic Neg liver ROS, GERD-  ,  Endo/Other  negative endocrine ROS  Renal/GU negative Renal ROS     Musculoskeletal   Abdominal   Peds  Hematology negative hematology ROS (+)   Anesthesia Other Findings   Reproductive/Obstetrics                           Anesthesia Physical Anesthesia Plan  ASA: I  Anesthesia Plan: General   Post-op Pain Management:    Induction: Intravenous  Airway Management Planned: Oral ETT  Additional Equipment:   Intra-op Plan:   Post-operative Plan: Extubation in OR  Informed Consent: I have reviewed the patients History and Physical, chart, labs and discussed the procedure including the risks, benefits and alternatives for the proposed anesthesia with the patient or authorized representative who has indicated his/her understanding and acceptance.   Dental advisory given  Plan Discussed with: CRNA and Surgeon  Anesthesia Plan Comments:         Anesthesia Quick Evaluation

## 2012-11-19 NOTE — Anesthesia Procedure Notes (Signed)
Procedure Name: Intubation Date/Time: 11/19/2012 10:43 AM Performed by: Gayla Medicus Pre-anesthesia Checklist: Patient identified, Timeout performed, Emergency Drugs available, Suction available and Patient being monitored Patient Re-evaluated:Patient Re-evaluated prior to inductionOxygen Delivery Method: Circle system utilized Preoxygenation: Pre-oxygenation with 100% oxygen Intubation Type: IV induction, Cricoid Pressure applied and Rapid sequence Laryngoscope Size: Mac and 3 Grade View: Grade I Tube type: Oral Tube size: 7.5 mm Number of attempts: 1 Airway Equipment and Method: Stylet Placement Confirmation: ETT inserted through vocal cords under direct vision,  positive ETCO2 and breath sounds checked- equal and bilateral Secured at: 23 cm Tube secured with: Tape Dental Injury: Teeth and Oropharynx as per pre-operative assessment

## 2012-11-19 NOTE — Transfer of Care (Signed)
Immediate Anesthesia Transfer of Care Note  Patient: Jenna Miller  Procedure(s) Performed: Procedure(s) (LRB) with comments: LAPAROSCOPIC CHOLECYSTECTOMY WITH INTRAOPERATIVE CHOLANGIOGRAM (N/A)  Patient Location: PACU  Anesthesia Type:General  Level of Consciousness: awake, alert  and oriented  Airway & Oxygen Therapy: Patient Spontanous Breathing and Patient connected to face mask oxygen  Post-op Assessment: Report given to PACU RN, Post -op Vital signs reviewed and stable and Patient moving all extremities X 4  Post vital signs: Reviewed and stable  Complications: No apparent anesthesia complications

## 2012-11-19 NOTE — H&P (View-Only) (Signed)
Patient ID: Jenna Miller, female   DOB: January 31, 1956, 56 y.o.   MRN: [id number]  Chief Complaint  Patient presents with  . Pre-op Exam    eval GB with stones    HPI Jenna Miller is a 56 y.o. female.  Chief complaint right upper quadrant abdominal pain HPI  Patient has had episodic right upper quadrant abdominal pain for approximately 6 months. She also had increased symptoms of reflux. Her gastroenterologist increased her Protonix dose. She had one severe attack of right upper quadrant pain earlier this month. She was evaluated at the emergency department with CT scan. This revealed gallstones but no evidence of cholecystitis at that time. Since then, she has been watching her diet closely and has avoided further attacks. When the attacks came previously they included right upper quadrant pain extending around to her back and also toward her epigastrium.  Past Medical History  Diagnosis Date  . High cholesterol   . Acid reflux   . Cancer     Past Surgical History  Procedure Date  . Breast surgery 1995    mastectomy left  . Tram 1999-2000  . Knee arthroscopy 1982  . Wisdom tooth extraction   Essure wire in fallopian tubes  Family History  Problem Relation Age of Onset  . Cancer Cousin     lung    Social History History  Substance Use Topics  . Smoking status: Never Smoker   . Smokeless tobacco: Not on file  . Alcohol Use: Yes     rare    Allergies  Allergen Reactions  . Lipitor (Atorvastatin)     Current Outpatient Prescriptions  Medication Sig Dispense Refill  . ezetimibe (ZETIA) 10 MG tablet Take 10 mg by mouth daily.      Marland Kitchen gemfibrozil (LOPID) 600 MG tablet Take 600 mg by mouth daily.      . hydrochlorothiazide (HYDRODIURIL) 25 MG tablet Take 25 mg by mouth daily.      Marland Kitchen oxyCODONE-acetaminophen (PERCOCET/ROXICET) 5-325 MG per tablet Take 2 tablets by mouth every 4 (four) hours as needed for pain.  15 tablet  0  . pantoprazole (PROTONIX) 40 MG tablet  Take 40 mg by mouth 2 (two) times daily.      . sucralfate (CARAFATE) 1 G tablet Take 1 g by mouth 4 (four) times daily.        Review of Systems Review of Systems  Constitutional: Negative for fever, chills and unexpected weight change.  HENT: Negative for hearing loss, congestion, sore throat, trouble swallowing and voice change.   Eyes: Negative for visual disturbance.  Respiratory: Negative for cough and wheezing.   Cardiovascular: Negative for chest pain, palpitations and leg swelling.  Gastrointestinal: Positive for abdominal pain. Negative for nausea, vomiting, diarrhea, constipation, blood in stool, abdominal distention and anal bleeding.       See history of present illness  Genitourinary: Negative for hematuria, vaginal bleeding and difficulty urinating.  Musculoskeletal: Negative for arthralgias.  Skin: Negative for rash and wound.  Neurological: Negative for seizures, syncope and headaches.  Hematological: Negative for adenopathy. Does not bruise/bleed easily.  Psychiatric/Behavioral: Negative for confusion.    Blood pressure 110/60, pulse 72, temperature 96.9 F (36.1 C), temperature source Temporal, height 5' 1.5" (1.562 m), weight 177 lb 3.2 oz (80.377 kg), last menstrual period 06/10/2010, SpO2 96.00%.  Physical Exam Physical Exam  Constitutional: She is oriented to person, place, and time. She appears well-developed and well-nourished.  HENT:  Head: Normocephalic and atraumatic.  Eyes: EOM are normal. Pupils are equal, round, and reactive to light. No scleral icterus.  Neck: Normal range of motion. Neck supple. No tracheal deviation present.  Cardiovascular: Normal rate, regular rhythm, normal heart sounds and intact distal pulses.   Pulmonary/Chest: Effort normal and breath sounds normal. No stridor. No respiratory distress. She has no wheezes. She has no rales.  Abdominal: Soft. Bowel sounds are normal. She exhibits no distension. There is no tenderness. There is  no rebound and no guarding.  Musculoskeletal: Normal range of motion.  Neurological: She is alert and oriented to person, place, and time.  Skin: Skin is warm and dry.    Data Reviewed Radiology reports  Assessment    Symptomatic cholelithiasis    Plan    I have offered laparoscopic cholecystectomy.I discussed the procedure in detail.  The patient was given Agricultural engineer.  We discussed the risks and benefits of a laparoscopic cholecystectomy and possible cholangiogram including, but not limited to bleeding, infection, injury to surrounding structures such as the intestine or liver, bile leak, retained gallstones, need to convert to an open procedure, prolonged diarrhea, blood clots such as  DVT, common bile duct injury, anesthesia risks, and possible need for additional procedures.  The likelihood of improvement in symptoms and return to the patient's normal status is good. We discussed the typical post-operative recovery course.  The patient requests that I discuss her case with Dr. Odis Luster In light of her previous TRAM surgery. I will speak with him tomorrow and call her back. At that point she will proceed with scheduling.       Hargun Spurling E 10/20/2012, 9:58 AM

## 2012-11-19 NOTE — Op Note (Signed)
11/19/2012  11:21 AM  PATIENT:  Jenna Miller  56 y.o. female  PRE-OPERATIVE DIAGNOSIS:  Symptomatic cholelithiasis  POST-OPERATIVE DIAGNOSIS:  Symptomatic cholelithiasis  PROCEDURE:  Procedure(s): LAPAROSCOPIC CHOLECYSTECTOMY WITH INTRAOPERATIVE CHOLANGIOGRAM  SURGEON:  Surgeon(s): Liz Malady, MD  PHYSICIAN ASSISTANT:   ASSISTANTS: none   ANESTHESIA:   local and general  EBL:  Total I/O In: 1000 [I.V.:1000] Out: -   BLOOD ADMINISTERED:none  DRAINS: none   SPECIMEN:  Excision  DISPOSITION OF SPECIMEN:  PATHOLOGY  COUNTS:  YES  DICTATION: Reubin Milan Dictation  Patient presents for cholecystectomy. She was identified in the preop holding area. She received intravenous antibiotics. She was brought to the operating room and general endotracheal anesthesia was administered by the anesthesia staff. Abdomen was prepped and draped in sterile fashion. Time out was done. Infraumbilical region was infiltrated with quarter percent Marcaine with epinephrine. Infraumbilical incision was made along her previous scar. Case tissues were dissected down and the anterior fascia was divided along the midline. Peritoneal cavity was gently entered under direct vision. Mesh from previous TRAM flap was in place. 0 Vicryl pursestring suture was placed around the fascial opening. Hassan trocar was inserted into the abdomen and the abdomen was insufflated with carbon dioxide in standard fashion. Under direct vision, a 5 mm epigastric and 5 mm lateral ports x2 were placed. Local was used to port sites. The dome the gallbladder was retracted superior medially. The infundibulum was retracted inferolaterally. Dissection began laterally and progressed medially easily identifying the cystic duct. Once critical view was obtained between the cystic duct the liver and the infundibulum a clip was placed on the infundibular cystic duct junction. [name] was made in the cystic duct and cholangiogram catheter was  inserted. Cholangiogram demonstrated no common bile duct filling defects and good flow of contrast into the duodenum. Cholangiocatheter was removed.  3 clips were placed proximally on the cystic duct and it was divided. Further dissection revealed an anterior and posterior branch of the cystic artery. These were clipped twice proximally and divided distally. Gallbladder was taken off the liver bed with Bovie cautery achieving excellent hemostasis along the way. Gallbladder was placed in an Endo Catch bag and removed from the infraumbilical port site. Liver bed was copiously irrigated. Irrigation fluid returned clear. Liver bed was reinspected and was completely dry. Clips remain in good position. Ports were removed under direct vision. Pneumoperitoneum was released. Informed local fascia was closed by tying the 0 Vicryl pursestring suture with care not to trap any intra-abdominal contents. All 4 wounds were copiously irrigated and the skin of each was closed with running 4 Vicryl followed by Dermabond. All counts were correct. Patient tolerated procedure well without apparent palpitations taken recovery in stable condition.  PATIENT DISPOSITION:  PACU - hemodynamically stable.   Delay start of Pharmacological VTE agent (>24hrs) due to surgical blood loss or risk of bleeding:  no  [name], MD, MPH, FACS Pager: [phone]  11/22/201311:21 AM

## 2012-11-19 NOTE — Preoperative (Signed)
Beta Blockers   Reason not to administer Beta Blockers: not prescribed 

## 2012-11-19 NOTE — Anesthesia Postprocedure Evaluation (Signed)
  Anesthesia Post-op Note  Patient: Jenna Miller  Procedure(s) Performed: Procedure(s) (LRB) with comments: LAPAROSCOPIC CHOLECYSTECTOMY WITH INTRAOPERATIVE CHOLANGIOGRAM (N/A)  Patient Location: PACU  Anesthesia Type:General  Level of Consciousness: awake  Airway and Oxygen Therapy: Patient Spontanous Breathing  Post-op Pain: mild  Post-op Assessment: Post-op Vital signs reviewed  Post-op Vital Signs: stable  Complications: No apparent anesthesia complications

## 2012-11-19 NOTE — Interval H&P Note (Signed)
History and Physical Interval Note:  11/19/2012 9:19 AM  [name]  has presented today for surgery, with the diagnosis of Symptomatic cholelithiasis  The various methods of treatment have been discussed with the patient and family. After consideration of risks, benefits and other options for treatment, the patient has consented to  Procedure(s) (LRB) with comments: LAPAROSCOPIC CHOLECYSTECTOMY WITH INTRAOPERATIVE CHOLANGIOGRAM (N/A) as a surgical intervention .  The patient's history has been reviewed, patient re-examined, no change in status, stable for surgery.  I have reviewed the patient's chart and labs.  Questions were answered to the patient's satisfaction.     Robertlee Rogacki E

## 2012-11-22 ENCOUNTER — Telehealth (INDEPENDENT_AMBULATORY_CARE_PROVIDER_SITE_OTHER): Payer: Self-pay | Admitting: General Surgery

## 2012-11-22 ENCOUNTER — Encounter (HOSPITAL_COMMUNITY): Payer: Self-pay | Admitting: General Surgery

## 2012-11-22 NOTE — Telephone Encounter (Signed)
Patient called in asking about wound care stated she was not given any instruction at the hospital upon discharge. Patient advised to shower as usual, but to not rub the incision sites, but to allow the water to run over sites and pat them dry. Advised patient to look out for discharge with foul odor, redness spreading from incision sites or fever. Also advised not to lift anything heavy and to allow the dermabond to peel from the sites on their own. Patient agreed.

## 2012-12-01 ENCOUNTER — Encounter (INDEPENDENT_AMBULATORY_CARE_PROVIDER_SITE_OTHER): Payer: Self-pay | Admitting: General Surgery

## 2012-12-01 ENCOUNTER — Ambulatory Visit (INDEPENDENT_AMBULATORY_CARE_PROVIDER_SITE_OTHER): Payer: BC Managed Care – PPO | Admitting: General Surgery

## 2012-12-01 VITALS — BP 101/60 | HR 86 | Temp 98.6°F | Resp 16 | Ht 61.5 in | Wt 174.8 lb

## 2012-12-01 DIAGNOSIS — Z9889 Other specified postprocedural states: Secondary | ICD-10-CM

## 2012-12-01 DIAGNOSIS — Z9049 Acquired absence of other specified parts of digestive tract: Secondary | ICD-10-CM

## 2012-12-01 NOTE — Progress Notes (Signed)
Subjective:     Patient ID: Jenna Miller, female   DOB: [birth year], 56 y.o.   MRN: [id number]  HPI Patient status post arthroscopic cholecystectomy. She's doing very well. She is no longer taking pain medication. Her reflux is improved but not resolved.  Review of Systems     Objective:   Physical Exam Abdomen is soft and nontender. All 4 wounds are healing well. No evidence of infection.    Assessment:     Doing well status post arthroscopic cholecystectomy with cholangiogram    Plan:     I reviewed the patient's pathology with her that showed chronic cholecystitis. She is cleared to return to work on December 9 with no heavy lifting until January 1. I will see her back as needed.

## 2013-02-14 ENCOUNTER — Encounter (INDEPENDENT_AMBULATORY_CARE_PROVIDER_SITE_OTHER): Payer: Self-pay

## 2013-03-14 ENCOUNTER — Other Ambulatory Visit: Payer: Self-pay | Admitting: Internal Medicine

## 2013-06-20 ENCOUNTER — Other Ambulatory Visit (HOSPITAL_COMMUNITY): Payer: Self-pay | Admitting: Internal Medicine

## 2013-06-20 ENCOUNTER — Ambulatory Visit (HOSPITAL_COMMUNITY)
Admission: RE | Admit: 2013-06-20 | Discharge: 2013-06-20 | Disposition: A | Discharge status: Home / Self Care | Payer: BC Managed Care – PPO | Source: Ambulatory Visit | Attending: Internal Medicine | Admitting: Internal Medicine

## 2013-06-20 DIAGNOSIS — R059 Cough, unspecified: Secondary | ICD-10-CM

## 2013-06-20 DIAGNOSIS — R079 Chest pain, unspecified: Secondary | ICD-10-CM | POA: Insufficient documentation

## 2013-06-20 DIAGNOSIS — R05 Cough: Secondary | ICD-10-CM

## 2013-06-20 DIAGNOSIS — Z853 Personal history of malignant neoplasm of breast: Secondary | ICD-10-CM | POA: Insufficient documentation

## 2013-06-22 ENCOUNTER — Other Ambulatory Visit: Payer: Self-pay | Admitting: Physician Assistant

## 2013-06-22 DIAGNOSIS — R05 Cough: Secondary | ICD-10-CM

## 2013-06-22 DIAGNOSIS — R059 Cough, unspecified: Secondary | ICD-10-CM

## 2013-06-23 ENCOUNTER — Ambulatory Visit
Admission: RE | Admit: 2013-06-23 | Discharge: 2013-06-23 | Disposition: A | Discharge status: Home / Self Care | Payer: BC Managed Care – PPO | Source: Ambulatory Visit | Attending: Physician Assistant | Admitting: Physician Assistant

## 2013-06-23 DIAGNOSIS — R059 Cough, unspecified: Secondary | ICD-10-CM

## 2013-06-23 DIAGNOSIS — R05 Cough: Secondary | ICD-10-CM

## 2013-07-06 ENCOUNTER — Other Ambulatory Visit: Payer: Self-pay

## 2013-07-06 DIAGNOSIS — Z853 Personal history of malignant neoplasm of breast: Secondary | ICD-10-CM

## 2013-07-06 DIAGNOSIS — Z1231 Encounter for screening mammogram for malignant neoplasm of breast: Secondary | ICD-10-CM

## 2013-08-08 ENCOUNTER — Ambulatory Visit
Admission: RE | Admit: 2013-08-08 | Discharge: 2013-08-08 | Disposition: A | Discharge status: Home / Self Care | Payer: BC Managed Care – PPO | Source: Ambulatory Visit

## 2013-08-08 ENCOUNTER — Other Ambulatory Visit: Payer: Self-pay

## 2013-08-08 DIAGNOSIS — Z853 Personal history of malignant neoplasm of breast: Secondary | ICD-10-CM

## 2013-08-08 DIAGNOSIS — Z1231 Encounter for screening mammogram for malignant neoplasm of breast: Secondary | ICD-10-CM

## 2013-11-14 ENCOUNTER — Ambulatory Visit: Payer: BC Managed Care – PPO | Admitting: Emergency Medicine

## 2013-11-14 ENCOUNTER — Encounter: Payer: Self-pay | Admitting: Emergency Medicine

## 2013-11-14 VITALS — BP 104/62 | HR 76 | Temp 98.0°F | Resp 16 | Ht 62.0 in | Wt 175.0 lb

## 2013-11-14 DIAGNOSIS — W57XXXA Bitten or stung by nonvenomous insect and other nonvenomous arthropods, initial encounter: Secondary | ICD-10-CM

## 2013-11-14 DIAGNOSIS — R109 Unspecified abdominal pain: Secondary | ICD-10-CM

## 2013-11-14 DIAGNOSIS — I10 Essential (primary) hypertension: Secondary | ICD-10-CM | POA: Insufficient documentation

## 2013-11-14 DIAGNOSIS — E782 Mixed hyperlipidemia: Secondary | ICD-10-CM | POA: Insufficient documentation

## 2013-11-14 LAB — BASIC METABOLIC PANEL WITH GFR
CO2: 29 mEq/L (ref 19–32)
Calcium: 10.6 mg/dL — ABNORMAL HIGH (ref 8.4–10.5)
Creat: 1.07 mg/dL (ref 0.50–1.10)
GFR, Est Non African American: 57 mL/min — ABNORMAL LOW
Sodium: 145 mEq/L (ref 135–145)

## 2013-11-14 LAB — CBC WITH DIFFERENTIAL/PLATELET
Basophils Absolute: 0 10*3/uL (ref 0.0–0.1)
Eosinophils Absolute: 0.2 10*3/uL (ref 0.0–0.7)
Eosinophils Relative: 2 % (ref 0–5)
Lymphocytes Relative: 22 % (ref 12–46)
Lymphs Abs: 1.7 10*3/uL (ref 0.7–4.0)
MCV: 85.4 fL (ref 78.0–100.0)
Neutrophils Relative %: 69 % (ref 43–77)
Platelets: 322 10*3/uL (ref 150–400)
RBC: 4.26 MIL/uL (ref 3.87–5.11)
RDW: 14.1 % (ref 11.5–15.5)
WBC: 7.6 10*3/uL (ref 4.0–10.5)

## 2013-11-14 LAB — HEPATIC FUNCTION PANEL
Albumin: 4.6 g/dL (ref 3.5–5.2)
Bilirubin, Direct: 0.1 mg/dL (ref 0.0–0.3)
Total Bilirubin: 0.4 mg/dL (ref 0.3–1.2)

## 2013-11-14 NOTE — Patient Instructions (Signed)
Insect Bite °Mosquitoes, flies, fleas, bedbugs, and other insects can bite. Insect bites are different from insect stings. The bite may be red, puffy (swollen), and itchy for 2 to 4 days. Most bites get better on their own. °HOME CARE  °· Do not scratch the bite. °· Keep the bite clean and dry. Wash the bite with soap and water. °· Put ice on the bite. °· Put ice in a plastic bag. °· Place a towel between your skin and the bag. °· Leave the ice on for 20 minutes, 4 times a day. Do this for the first 2 to 3 days, or as told by your doctor. °· You may use medicated lotions or creams to lessen itching as told by your doctor. °· Only take medicines as told by your doctor. °· If you are given medicines (antibiotics), take them as told. Finish them even if you start to feel better. °You may need a tetanus shot if: °· You cannot remember when you had your last tetanus shot. °· You have never had a tetanus shot. °· The injury broke your skin. °If you need a tetanus shot and you choose not to have one, you may get tetanus. Sickness from tetanus can be serious. °GET HELP RIGHT AWAY IF:  °· You have more pain, redness, or puffiness. °· You see a red line on the skin coming from the bite. °· You have a fever. °· You have joint pain. °· You have a headache or neck pain. °· You feel weak. °· You have a rash. °· You have chest pain, or you are short of breath. °· You have belly (abdominal) pain. °· You feel sick to your stomach (nauseous) or throw up (vomit). °· You feel very tired or sleepy. °MAKE SURE YOU:  °· Understand these instructions. °· Will watch your condition. °· Will get help right away if you are not doing well or get worse. °Document Released: 12/12/2000 Document Revised: 03/08/2012 Document Reviewed: 07/16/2011 °[employer]® Patient Information ©2014 [employer]. ° °

## 2013-11-15 NOTE — Progress Notes (Signed)
  Subjective:    Patient ID: Jenna Miller, female    DOB: 30-Jul-1956, 57 y.o.   MRN: [id number]  HPI Comments: 57 yo presents with initial f/u visit from [facility] needing recheck of questionable spider bite on her left side of the abdomen. Patient noticed bite 11/16 after getting out of bed. She took pictures to show progression of wound from initial nickel size blister which ruptured with clear exudate to progressing erythema approximately 2 inches surrounding that area. She notes the area was painful and initially hard but with the Keflex the redness has improved, drainage has d/c and the hardness is diminishing. She notes she has a mild headache but is worried about the bite. Denies fever or fatigue or any other symptoms.  Current Outpatient Prescriptions on File Prior to Visit  Medication Sig Dispense Refill  . ezetimibe (ZETIA) 10 MG tablet Take 10 mg by mouth daily.      Marland Kitchen gemfibrozil (LOPID) 600 MG tablet Take 600 mg by mouth 2 (two) times daily.       . hydrochlorothiazide (HYDRODIURIL) 25 MG tablet Take 25 mg by mouth daily.      . magnesium oxide (MAG-OX) 400 (241.3 MG) MG tablet Take 400 mg by mouth daily.      . pantoprazole (PROTONIX) 40 MG tablet Take 40 mg by mouth 2 (two) times daily.       No current facility-administered medications on file prior to visit.   ALLERGIES Lipitor and Wellbutrin    Review of Systems  Skin: Positive for color change and wound.  All other systems reviewed and are negative.      BP 104/62  Pulse 76  Temp(Src) 98 F (36.7 C) (Temporal)  Resp 16  Ht 5\' 2"  (1.575 m)  Wt 175 lb (79.379 kg)  BMI 32.00 kg/m2  LMP 06/10/2010  Objective:   Physical Exam  Nursing note and vitals reviewed. Constitutional: She appears well-developed and well-nourished.  Cardiovascular: Normal rate, regular rhythm, normal heart sounds and intact distal pulses.   Pulmonary/Chest: Effort normal and breath sounds normal.  Abdominal: Soft. Bowel sounds are normal.  She exhibits no mass. There is tenderness.  At wound which is about 1.5  Inches to the left of the umbilicus tenderness and about 50 cent size firmness.  Musculoskeletal: Normal range of motion.  Lymphadenopathy:    She has no cervical adenopathy.  Neurological: She is alert.  Skin: Skin is warm and dry.  1 .5 inches to left of umbilicus quarter size area of erythema with lateral circular border which appears to be a scaled edge. No exudate, no streaking  Psychiatric: She has a normal mood and affect. Judgment normal.          Assessment & Plan:  1. Probable spider bite- with significant improvement with Keflex will continue AD, check labs, hygiene explained , w/c if any symptom increase or does not improve by Friday. Low suspicion for more serious problem such as sister mary josephs nodule with significant improvement with ABX.

## 2013-11-17 ENCOUNTER — Encounter: Payer: Self-pay | Admitting: Internal Medicine

## 2013-11-17 DIAGNOSIS — K760 Fatty (change of) liver, not elsewhere classified: Secondary | ICD-10-CM | POA: Insufficient documentation

## 2013-11-17 DIAGNOSIS — E559 Vitamin D deficiency, unspecified: Secondary | ICD-10-CM | POA: Insufficient documentation

## 2013-11-17 DIAGNOSIS — K219 Gastro-esophageal reflux disease without esophagitis: Secondary | ICD-10-CM | POA: Insufficient documentation

## 2013-11-18 ENCOUNTER — Encounter: Payer: Self-pay | Admitting: Emergency Medicine

## 2013-11-18 ENCOUNTER — Ambulatory Visit: Payer: BC Managed Care – PPO | Admitting: Emergency Medicine

## 2013-11-18 DIAGNOSIS — R109 Unspecified abdominal pain: Secondary | ICD-10-CM

## 2013-11-18 MED ORDER — CEPHALEXIN 500 MG PO CAPS: 500.0000 mg | ORAL_CAPSULE | Freq: Three times a day (TID) | ORAL | Status: DC

## 2013-11-18 NOTE — Patient Instructions (Signed)
Abdominal Pain °Many things can cause belly (abdominal) pain. Most times, the belly pain is not dangerous. The amount of belly pain does not tell how serious the problem may be. Many cases of belly pain can be watched and treated at home. °HOME CARE  °· Do not take medicines that help you go poop (laxatives) unless told to by your doctor. °· Only take medicine as told by your doctor. °· Eat or drink as told by your doctor. Your doctor will tell you if you should be on a special diet. °GET HELP RIGHT AWAY IF:  °· The pain does not go away. °· You have a fever. °· You keep throwing up (vomiting). °· The pain changes and is only in the right or left part of the belly. °· You have bloody or tarry looking poop. °MAKE SURE YOU:  °· Understand these instructions. °· Will watch your condition. °· Will get help right away if you are not doing well or get worse. °Document Released: 06/02/2008 Document Revised: 03/08/2012 Document Reviewed: 12/31/2009 °[employer]® Patient Information ©2014 [employer]. ° °

## 2013-11-20 NOTE — Progress Notes (Signed)
  Subjective:    Patient ID: Jenna Miller, female    DOB: May 06, 1956, 57 y.o.   MRN: [id number]  HPI Comments: F/U visit with questionable spider bite on 11/13/13. She has almost completed her antibiotics and has seen improvement with wound. She is still with mild abdomen pain and fullness on left side. She denies fever or discharge from wound.    Current Outpatient Prescriptions on File Prior to Visit  Medication Sig Dispense Refill  . ALPRAZolam (XANAX) 1 MG tablet Take 1 mg by mouth at bedtime as needed for anxiety.      . Calcium Carb-Cholecalciferol (CALCIUM 1000 + D PO) Take by mouth daily.      . cholecalciferol (VITAMIN D) 1000 UNITS tablet Take 8,000 Units by mouth daily.      . Cyanocobalamin (VITAMIN B 12 PO) Take by mouth daily.      Marland Kitchen ezetimibe (ZETIA) 10 MG tablet Take 10 mg by mouth daily.      Marland Kitchen gemfibrozil (LOPID) 600 MG tablet Take 600 mg by mouth 2 (two) times daily.       . hydrochlorothiazide (HYDRODIURIL) 25 MG tablet Take 25 mg by mouth daily.      . magnesium oxide (MAG-OX) 400 (241.3 MG) MG tablet Take 400 mg by mouth daily.      . pantoprazole (PROTONIX) 40 MG tablet Take 40 mg by mouth 2 (two) times daily.      . Pyridoxine HCl (VITAMIN B-6) 250 MG tablet Take 250 mg by mouth daily.      . metFORMIN (GLUCOPHAGE) 500 MG tablet Take by mouth 2 (two) times daily with a meal.       No current facility-administered medications on file prior to visit.   ALLERGIES Lipitor; Effexor; Paxil; and Wellbutrin  Past Medical History  Diagnosis Date  . Complication of anesthesia     woke  up X 1  . Vitamin D deficiency   . Hypertension   . High cholesterol   . Acid reflux   . Cancer     Left Breast  . Arthritis   . Fatty liver     Review of Systems  Gastrointestinal: Positive for abdominal pain.  Skin: Positive for wound.  All other systems reviewed and are negative.   BP 112/68  Pulse 62  Temp(Src) 98.2 F (36.8 C) (Temporal)  Resp 18  Wt 175 lb  (79.379 kg)  LMP 06/10/2010     Objective:   Physical Exam  Nursing note and vitals reviewed. Constitutional: She appears well-developed and well-nourished.  Cardiovascular: Normal rate, regular rhythm, normal heart sounds and intact distal pulses.   Pulmonary/Chest: Effort normal and breath sounds normal.  Abdominal: Soft. Bowel sounds are normal. She exhibits mass. She exhibits no distension. There is tenderness.  At site of wound, approximately quarter size firmness.  Neurological: She is alert.  Skin: Skin is warm and dry.  Erythema at wound only, no streaking. Wound slightly larger than a nickel with appropriate healing No exudate.  Psychiatric: Judgment normal.          Assessment & Plan:  Probably spider bite with abdomen tenderness extend BX x 10 days and get CT abdomen if no change. Patient wants to wait until next Friday to schedule incase symptoms improve. W/C if symptoms increase

## 2013-11-23 ENCOUNTER — Ambulatory Visit: Payer: BC Managed Care – PPO | Admitting: Physician Assistant

## 2013-11-23 ENCOUNTER — Encounter: Payer: Self-pay | Admitting: Physician Assistant

## 2013-11-23 ENCOUNTER — Ambulatory Visit: Payer: BC Managed Care – PPO | Admitting: Emergency Medicine

## 2013-11-23 VITALS — BP 120/68 | HR 64 | Temp 97.9°F | Resp 16 | Ht 62.0 in | Wt 177.0 lb

## 2013-11-23 DIAGNOSIS — R109 Unspecified abdominal pain: Secondary | ICD-10-CM

## 2013-11-23 LAB — BASIC METABOLIC PANEL WITH GFR
CO2: 26 mEq/L (ref 19–32)
Chloride: 102 mEq/L (ref 96–112)
GFR, Est African American: 85 mL/min
Glucose, Bld: 110 mg/dL — ABNORMAL HIGH (ref 70–99)
Potassium: 3.5 mEq/L (ref 3.5–5.3)
Sodium: 140 mEq/L (ref 135–145)

## 2013-11-23 LAB — HEPATIC FUNCTION PANEL
ALT: 21 U/L (ref 0–35)
Alkaline Phosphatase: 53 U/L (ref 39–117)
Bilirubin, Direct: 0.1 mg/dL (ref 0.0–0.3)
Indirect Bilirubin: 0.2 mg/dL (ref 0.0–0.9)

## 2013-11-23 LAB — CBC WITH DIFFERENTIAL/PLATELET
Basophils Relative: 0 % (ref 0–1)
HCT: 34.6 % — ABNORMAL LOW (ref 36.0–46.0)
Hemoglobin: 12 g/dL (ref 12.0–15.0)
Lymphocytes Relative: 33 % (ref 12–46)
Lymphs Abs: 1.9 10*3/uL (ref 0.7–4.0)
Monocytes Absolute: 0.5 10*3/uL (ref 0.1–1.0)
Monocytes Relative: 9 % (ref 3–12)
Neutro Abs: 3.2 10*3/uL (ref 1.7–7.7)
Neutrophils Relative %: 55 % (ref 43–77)
RBC: 4.12 MIL/uL (ref 3.87–5.11)
WBC: 5.7 10*3/uL (ref 4.0–10.5)

## 2013-11-23 NOTE — Progress Notes (Signed)
Subjective:    Patient ID: Jenna Miller, female    DOB: 12-27-1956, 57 y.o.   MRN: [id number]  HPI Patient has been followed in the office very closely for a possible spider bite on her left abdomen. He hs been on Keflex 500mg  3 times a day since 11/13/13. Patient states it has improved significantly since on the keflex and is still on it since Sunday. Patient continues to have abdominal tenderness and sensation around the wound. Patient has had a headache, and decreased appetite.  Denies fever, chills, nausea, diarrhea. Denies discharge, warmth.   Current Outpatient Prescriptions on File Prior to Visit  Medication Sig Dispense Refill  . ALPRAZolam (XANAX) 1 MG tablet Take 1 mg by mouth at bedtime as needed for anxiety.      . Calcium Carb-Cholecalciferol (CALCIUM 1000 + D PO) Take by mouth daily.      . cephALEXin (KEFLEX) 500 MG capsule Take 1 capsule (500 mg total) by mouth 3 (three) times daily.  30 capsule  0  . cholecalciferol (VITAMIN D) 1000 UNITS tablet Take 8,000 Units by mouth daily.      . Cyanocobalamin (VITAMIN B 12 PO) Take by mouth daily.      Marland Kitchen ezetimibe (ZETIA) 10 MG tablet Take 10 mg by mouth daily.      Marland Kitchen gemfibrozil (LOPID) 600 MG tablet Take 600 mg by mouth 2 (two) times daily.       . hydrochlorothiazide (HYDRODIURIL) 25 MG tablet Take 25 mg by mouth daily.      . magnesium oxide (MAG-OX) 400 (241.3 MG) MG tablet Take 400 mg by mouth daily.      . pantoprazole (PROTONIX) 40 MG tablet Take 40 mg by mouth 2 (two) times daily.      . Pyridoxine HCl (VITAMIN B-6) 250 MG tablet Take 250 mg by mouth daily.      . metFORMIN (GLUCOPHAGE) 500 MG tablet Take by mouth 2 (two) times daily with a meal.       No current facility-administered medications on file prior to visit.   Past Medical History  Diagnosis Date  . Complication of anesthesia     woke  up X 1  . Vitamin D deficiency   . Hypertension   . High cholesterol   . Acid reflux   . Cancer     Left Breast   . Arthritis   . Fatty liver    Review of Systems  Constitutional: Positive for appetite change (decrease) and fatigue.  HENT: Negative.   Eyes: Negative.   Respiratory: Negative.   Cardiovascular: Negative.   Gastrointestinal: Positive for abdominal pain. Negative for nausea, diarrhea, constipation and blood in stool.  Endocrine: Negative.   Genitourinary: Negative.   Skin:       lesion       Objective:   Physical Exam  Constitutional: She appears well-developed and well-nourished.  HENT:  Head: Normocephalic and atraumatic.  Eyes: Conjunctivae are normal. Pupils are equal, round, and reactive to light.  Neck: Normal range of motion. Neck supple.  Abdominal: Soft. Bowel sounds are normal. She exhibits no mass. There is tenderness (RLQ). There is no rebound and no guarding.  Skin: Skin is warm and dry.  A bit larger than a nickel healing, scabbing area left of the umbilicus, with mild erythema no dishcharge, swelling.      Assessment & Plan:  Spider bite Abdominal pain  Finish ABX Area is looking better Will recheck CBC,BMP,LFTs and if they  are abnormal we will proceed with CT AB on Dec 5th If Abdominal pain is continuing than we will get CT scan.  Will be in contact Monday

## 2013-11-30 ENCOUNTER — Telehealth: Payer: Self-pay | Admitting: *Deleted

## 2013-12-02 ENCOUNTER — Ambulatory Visit
Admission: RE | Admit: 2013-12-02 | Discharge: 2013-12-02 | Disposition: A | Discharge status: Home / Self Care | Payer: Self-pay | Source: Ambulatory Visit | Attending: Emergency Medicine | Admitting: Emergency Medicine

## 2013-12-02 DIAGNOSIS — R109 Unspecified abdominal pain: Secondary | ICD-10-CM

## 2013-12-02 MED ORDER — IOHEXOL 300 MG/ML  SOLN
100.0000 mL | Freq: Once | INTRAMUSCULAR | Status: AC | PRN
  Administered 2013-12-02: 100 mL via INTRAVENOUS

## 2013-12-05 NOTE — Telephone Encounter (Signed)
Spoke with patient in detail about recent CT scan results.  Patient declines Oncology eval at this time, will call if symptoms persist.

## 2013-12-05 NOTE — Telephone Encounter (Signed)
Pt had CAT Scan, has looked at My Chart, but would like to speak to you or nurse about results. Please call

## 2013-12-20 ENCOUNTER — Other Ambulatory Visit: Payer: Self-pay | Admitting: Internal Medicine

## 2014-01-02 ENCOUNTER — Encounter: Payer: Self-pay | Admitting: Emergency Medicine

## 2014-01-02 ENCOUNTER — Ambulatory Visit (INDEPENDENT_AMBULATORY_CARE_PROVIDER_SITE_OTHER): Payer: BC Managed Care – PPO | Admitting: Emergency Medicine

## 2014-01-02 VITALS — BP 106/74 | HR 62 | Temp 98.6°F | Resp 18 | Ht 61.5 in | Wt 175.0 lb

## 2014-01-02 DIAGNOSIS — R7309 Other abnormal glucose: Secondary | ICD-10-CM

## 2014-01-02 DIAGNOSIS — J309 Allergic rhinitis, unspecified: Secondary | ICD-10-CM

## 2014-01-02 DIAGNOSIS — E782 Mixed hyperlipidemia: Secondary | ICD-10-CM

## 2014-01-02 LAB — HEMOGLOBIN A1C
HEMOGLOBIN A1C: 6.2 % — AB (ref <5.7)
Mean Plasma Glucose: 131 mg/dL — ABNORMAL HIGH (ref <117)

## 2014-01-02 LAB — HEPATIC FUNCTION PANEL
ALBUMIN: 4.5 g/dL (ref 3.5–5.2)
ALK PHOS: 62 U/L (ref 39–117)
ALT: 25 U/L (ref 0–35)
AST: 23 U/L (ref 0–37)
BILIRUBIN INDIRECT: 0.3 mg/dL (ref 0.0–0.9)
BILIRUBIN TOTAL: 0.4 mg/dL (ref 0.3–1.2)
Bilirubin, Direct: 0.1 mg/dL (ref 0.0–0.3)
Total Protein: 7.4 g/dL (ref 6.0–8.3)

## 2014-01-02 LAB — BASIC METABOLIC PANEL WITH GFR
BUN: 21 mg/dL (ref 6–23)
CALCIUM: 10 mg/dL (ref 8.4–10.5)
CO2: 28 meq/L (ref 19–32)
CREATININE: 0.95 mg/dL (ref 0.50–1.10)
Chloride: 97 mEq/L (ref 96–112)
GFR, EST AFRICAN AMERICAN: 76 mL/min
GFR, Est Non African American: 66 mL/min
GLUCOSE: 110 mg/dL — AB (ref 70–99)
Potassium: 3.8 mEq/L (ref 3.5–5.3)
SODIUM: 139 meq/L (ref 135–145)

## 2014-01-02 LAB — LIPID PANEL
CHOL/HDL RATIO: 3.4 ratio
Cholesterol: 188 mg/dL (ref 0–200)
HDL: 56 mg/dL (ref >39)
LDL Cholesterol: 120 mg/dL — ABNORMAL HIGH (ref 0–99)
TRIGLYCERIDES: 58 mg/dL (ref <150)
VLDL: 12 mg/dL (ref 0–40)

## 2014-01-02 LAB — CBC WITH DIFFERENTIAL/PLATELET
BASOS PCT: 1 % (ref 0–1)
Basophils Absolute: 0 10*3/uL (ref 0.0–0.1)
EOS ABS: 0.2 10*3/uL (ref 0.0–0.7)
EOS PCT: 3 % (ref 0–5)
HEMATOCRIT: 34.8 % — AB (ref 36.0–46.0)
HEMOGLOBIN: 12.6 g/dL (ref 12.0–15.0)
LYMPHS ABS: 1.4 10*3/uL (ref 0.7–4.0)
Lymphocytes Relative: 29 % (ref 12–46)
MCH: 30.1 pg (ref 26.0–34.0)
MCHC: 36.2 g/dL — AB (ref 30.0–36.0)
MCV: 83.3 fL (ref 78.0–100.0)
MONO ABS: 0.5 10*3/uL (ref 0.1–1.0)
MONOS PCT: 9 % (ref 3–12)
Neutro Abs: 2.9 10*3/uL (ref 1.7–7.7)
Neutrophils Relative %: 58 % (ref 43–77)
Platelets: 314 10*3/uL (ref 150–400)
RBC: 4.18 MIL/uL (ref 3.87–5.11)
RDW: 13.9 % (ref 11.5–15.5)
WBC: 4.9 10*3/uL (ref 4.0–10.5)

## 2014-01-02 NOTE — Patient Instructions (Signed)
Allergic Rhinitis Allergic rhinitis is when the mucous membranes in the nose respond to allergens. Allergens are particles in the air that cause your body to have an allergic reaction. This causes you to release allergic antibodies. Through a chain of events, these eventually cause you to release histamine into the blood stream (hence the use of antihistamines). Although meant to be protective to the body, it is this release that causes your discomfort, such as frequent sneezing, congestion and an itchy runny nose.  CAUSES  The pollen allergens may come from grasses, trees, and weeds. This is seasonal allergic rhinitis, or "hay fever." Other allergens cause year-round allergic rhinitis (perennial allergic rhinitis) such as house dust mite allergen, pet dander and mold spores.  SYMPTOMS   Nasal stuffiness (congestion).  Runny, itchy nose with sneezing and tearing of the eyes.  There is often an itching of the mouth, eyes and ears. It cannot be cured, but it can be controlled with medications. DIAGNOSIS  If you are unable to determine the offending allergen, skin or blood testing may find it. TREATMENT   Avoid the allergen.  Medications and allergy shots (immunotherapy) can help.  Hay fever may often be treated with antihistamines in pill or nasal spray forms. Antihistamines block the effects of histamine. There are over-the-counter medicines that may help with nasal congestion and swelling around the eyes. Check with your caregiver before taking or giving this medicine. If the treatment above does not work, there are many new medications your caregiver can prescribe. Stronger medications may be used if initial measures are ineffective. Desensitizing injections can be used if medications and avoidance fails. Desensitization is when a patient is given ongoing shots until the body becomes less sensitive to the allergen. Make sure you follow up with your caregiver if problems continue. SEEK MEDICAL  CARE IF:   You develop fever (more than 100.5 F (38.1 C).  You develop a cough that does not stop easily (persistent).  You have shortness of breath.  You start wheezing.  Symptoms interfere with normal daily activities. Document Released: 09/09/2001 Document Revised: 03/08/2012 Document Reviewed: 03/21/2009 [facility] Patient Information 2014 [place]. Spider Bite Most spider bites do not cause serious problems. HOME CARE  Do not scratch the bite.  Keep the bite clean and dry. Wash the bite with soap and water as told by your doctor.  Put ice on the bite.  Put ice in a plastic bag.  Place a towel between your skin and the bag.  Leave the ice on for 20 minutes. Do this 4 times a day for the first 2 to 3 days or as told by your doctor.  Raise (elevate) the bite above your heart.  Only take medicine as told by your doctor.  If you are given medicines (antibiotics), take them as told. Finish them even if you start to feel better. You may need a tetanus shot if:  You cannot remember when you had your last tetanus shot.  You have never had a tetanus shot.  The bite broke your skin. If you need a tetanus shot and you choose not to have one, you may get tetanus. Sickness from tetanus can be serious. GET HELP RIGHT AWAY IF:  Your bite turns purple.  Your bite gets more puffy (swollen), painful, or red.  You are short of breath or have chest pain.  You have muscle cramps or painful muscle spasms.  You have belly (abdominal) pain.  You feel sick to your stomach (nauseous)  or throw up (vomit).  You feel very tired or sleepy.  Your bite is not better after 3 days of treatment. MAKE SURE YOU:  Understand these instructions.  Will watch your condition.  Will get help right away if you are not doing well or get worse. Document Released: 01/17/2011 Document Revised: 03/08/2012 Document Reviewed: 07/16/2011 [facility] Patient Information 2014 [place],  [place].

## 2014-01-02 NOTE — Progress Notes (Signed)
Subjective:    Patient ID: Jenna Miller, female    DOB: 10-02-1956, 58 y.o.   MRN: [id number]  HPI Comments: 58 yo female presents for 3 month F/U for Cholesterol, Pre-Dm, D. Deficient. She has tried to improve diet and has lost 2 # over the holidays. She keeps busy for exercise. LAST LABS BS 107 T 198 TG 60 H 57 L 129 A1C 6.2 INSUL 25 D 65  She still has pain at site of ? Spider bite site. She had negative CT of ABD to evaluate with hx of breast Cancer. She did hit area over the weekend and has been having more pain again and swelling never completely resolved. She notes scab has fallen off and reoccurred. SHE DID NOT F/U WITH ONCOLOGY AD.  Head congestion x 1.5 weeks with occasional production after hot shower. Chest dry cough. Ears a little full and throat itchy. She denies OTC relief.  Hyperlipidemia  Gastrophageal Reflux She complains of coughing and a sore throat.   Current Outpatient Prescriptions on File Prior to Visit  Medication Sig Dispense Refill  . ALPRAZolam (XANAX) 1 MG tablet Take 1 mg by mouth at bedtime as needed for anxiety.      . Calcium Carb-Cholecalciferol (CALCIUM 1000 + D PO) Take by mouth daily.      . cholecalciferol (VITAMIN D) 1000 UNITS tablet Take 8,000 Units by mouth daily.      . Cyanocobalamin (VITAMIN B 12 PO) Take by mouth daily.      Marland Kitchen gemfibrozil (LOPID) 600 MG tablet TAKE 1 TABLET TWICE A DAY WITH FOOD FOR CHOLESTEROL  180 tablet  3  . hydrochlorothiazide (HYDRODIURIL) 25 MG tablet TAKE 1 TABLET BY MOUTH EVERY DAY  90 tablet  3  . magnesium oxide (MAG-OX) 400 (241.3 MG) MG tablet Take 400 mg by mouth daily.      . pantoprazole (PROTONIX) 40 MG tablet EVERY DAY  90 tablet  4  . Pyridoxine HCl (VITAMIN B-6) 250 MG tablet Take 250 mg by mouth daily.      Marland Kitchen ZETIA 10 MG tablet TAKE 1 TABLET EVERY DAY FOR CHOLESTEROL  90 tablet  1   No current facility-administered medications on file prior to visit.   ALLERGIES Lipitor; Effexor; Paxil; and  Wellbutrin  Past Medical History  Diagnosis Date  . Complication of anesthesia     woke  up X 1  . Vitamin D deficiency   . Hypertension   . High cholesterol   . Acid reflux   . Cancer     Left Breast  . Arthritis   . Fatty liver       Review of Systems  HENT: Positive for congestion, postnasal drip and sore throat.   Eyes: Positive for itching.  Respiratory: Positive for cough.   Skin: Positive for wound.   BP 106/74  Pulse 62  Temp(Src) 98.6 F (37 C) (Temporal)  Resp 18  Ht 5' 1.5" (1.562 m)  Wt 175 lb (79.379 kg)  BMI 32.53 kg/m2  LMP 06/10/2010     Objective:   Physical Exam  Nursing note and vitals reviewed. Constitutional: She is oriented to person, place, and time. She appears well-developed and well-nourished. No distress.  HENT:  Head: Normocephalic and atraumatic.  Right Ear: External ear normal.  Left Ear: External ear normal.  Nose: Nose normal.  Mouth/Throat: Oropharynx is clear and moist. No oropharyngeal exudate.  Cloudy TMs  Eyes: Conjunctivae and EOM are normal.  Neck: Normal range  of motion. Neck supple. No JVD present. No thyromegaly present.  Cardiovascular: Normal rate, regular rhythm, normal heart sounds and intact distal pulses.   Pulmonary/Chest: Effort normal and breath sounds normal.  Abdominal: Soft. Bowel sounds are normal. She exhibits no distension and no mass. There is no tenderness. There is no rebound and no guarding.    Musculoskeletal: Normal range of motion. She exhibits no edema and no tenderness.  Lymphadenopathy:    She has no cervical adenopathy.  Neurological: She is alert and oriented to person, place, and time. No cranial nerve deficit.  Skin: Skin is warm and dry. No rash noted. No erythema. No pallor.  Wound has improved from quarter size down to < dime size with minimal erythema at edges of appropriately healing scab.  Psychiatric: She has a normal mood and affect. Her behavior is normal. Judgment and thought  content normal.          Assessment & Plan:  1.  3 month F/U for Cholesterol, Pre-Dm, D. Deficient. Needs healthy diet, cardio QD and obtain healthy weight. Check Labs, Check BP if >130/80 call office.  2.Allergic rhinitis- Allegra OTC, increase H2o, allergy hygiene explained. 3 ? Spider bite recheck- Call if sx increase try topical betadine/ sugar paste BID to see if speeds up healing time. Advised if she still has concerns with NEG Ct ABD/Pelvis about cancer she needs to f/u with ONC or ref to [facility] she declines both, she w/c if no change

## 2014-01-03 LAB — INSULIN, FASTING: Insulin fasting, serum: 29 u[IU]/mL — ABNORMAL HIGH (ref 3–28)

## 2014-01-05 ENCOUNTER — Encounter: Payer: Self-pay | Admitting: Internal Medicine

## 2014-02-24 ENCOUNTER — Other Ambulatory Visit: Payer: Self-pay | Admitting: *Deleted

## 2014-02-24 MED ORDER — PANTOPRAZOLE SODIUM 40 MG PO TBEC: 40.0000 mg | DELAYED_RELEASE_TABLET | Freq: Two times a day (BID) | ORAL | Status: DC

## 2014-03-14 ENCOUNTER — Ambulatory Visit (INDEPENDENT_AMBULATORY_CARE_PROVIDER_SITE_OTHER): Payer: BC Managed Care – PPO | Admitting: Internal Medicine

## 2014-03-14 ENCOUNTER — Encounter: Payer: Self-pay | Admitting: Internal Medicine

## 2014-03-14 VITALS — BP 118/70 | HR 68 | Temp 98.1°F | Resp 16 | Ht 61.75 in | Wt 176.4 lb

## 2014-03-14 DIAGNOSIS — E559 Vitamin D deficiency, unspecified: Secondary | ICD-10-CM

## 2014-03-14 DIAGNOSIS — R7401 Elevation of levels of liver transaminase levels: Secondary | ICD-10-CM

## 2014-03-14 DIAGNOSIS — Z79899 Other long term (current) drug therapy: Secondary | ICD-10-CM

## 2014-03-14 DIAGNOSIS — Z111 Encounter for screening for respiratory tuberculosis: Secondary | ICD-10-CM

## 2014-03-14 DIAGNOSIS — Z1212 Encounter for screening for malignant neoplasm of rectum: Secondary | ICD-10-CM

## 2014-03-14 DIAGNOSIS — Z113 Encounter for screening for infections with a predominantly sexual mode of transmission: Secondary | ICD-10-CM

## 2014-03-14 DIAGNOSIS — R7402 Elevation of levels of lactic acid dehydrogenase (LDH): Secondary | ICD-10-CM

## 2014-03-14 DIAGNOSIS — I10 Essential (primary) hypertension: Secondary | ICD-10-CM

## 2014-03-14 DIAGNOSIS — R740 Nonspecific elevation of levels of transaminase and lactic acid dehydrogenase [LDH]: Secondary | ICD-10-CM

## 2014-03-14 DIAGNOSIS — Z0000 Encounter for general adult medical examination without abnormal findings: Secondary | ICD-10-CM

## 2014-03-14 LAB — CBC WITH DIFFERENTIAL/PLATELET
BASOS ABS: 0 10*3/uL (ref 0.0–0.1)
Basophils Relative: 0 % (ref 0–1)
EOS PCT: 2 % (ref 0–5)
Eosinophils Absolute: 0.1 10*3/uL (ref 0.0–0.7)
HCT: 34.6 % — ABNORMAL LOW (ref 36.0–46.0)
Hemoglobin: 12.1 g/dL (ref 12.0–15.0)
Lymphocytes Relative: 37 % (ref 12–46)
Lymphs Abs: 2.1 10*3/uL (ref 0.7–4.0)
MCH: 28.9 pg (ref 26.0–34.0)
MCHC: 35 g/dL (ref 30.0–36.0)
MCV: 82.8 fL (ref 78.0–100.0)
Monocytes Absolute: 0.3 10*3/uL (ref 0.1–1.0)
Monocytes Relative: 6 % (ref 3–12)
NEUTROS ABS: 3.1 10*3/uL (ref 1.7–7.7)
NEUTROS PCT: 55 % (ref 43–77)
Platelets: 307 10*3/uL (ref 150–400)
RBC: 4.18 MIL/uL (ref 3.87–5.11)
RDW: 13.9 % (ref 11.5–15.5)
WBC: 5.6 10*3/uL (ref 4.0–10.5)

## 2014-03-14 NOTE — Progress Notes (Signed)
Patient ID: Jenna Miller, female   DOB: 1956-05-25, 58 y.o.   MRN: [id number]   Annual Screening Comprehensive Examination  This very nice 57 y.o. SWF presents for complete physical.  Patient has been followed for HTN,  Prediabetes, Hyperlipidemia, Hx/o Lt Breast [phone]) and Vitamin D Deficiency.    HTN predates since 2009. Patient's BP has been controlled at home. Today's BP: 118/70 mmHg. Patient denies any cardiac symptoms as chest pain, palpitations, shortness of breath, dizziness or ankle swelling.   Patient's hyperlipidemia is controlled with diet and medications. Patient denies myalgias or other medication SE's. Last cholesterol last visit was  , triglycerides  , HDL  and LDL   .     Patient has Obesity with BMI 32 and also prediabetes and A1c 5.9% predating since 2012 and reports occasional random and fasting glucoses range 95-105 mg%. Last A1c was 6.2% in Nov 2014. Patient denies reactive hypoglycemic symptoms, visual blurring, diabetic polys, or paresthesias.    Finally, patient has history of Vitamin D Deficiencyof 39 in 2008 and last vitamin D was 65 in Dec 2014.   Medication Sig  . ALPRAZolam (XANAX) 1 MG tablet Take 1 mg by mouth at bedtime as needed for anxiety.  . Calcium Carb-Cholecalciferol (CA 1000 + D PO) Take by mouth daily.  . Cyanocobalamin (VITAMIN B 12 PO) Take by mouth daily.  Marland Kitchen gemfibrozil (LOPID) 600 MG tablet TAKE 1 TABLET TWICE A DAY FOR CHOLESTEROL  . hydrochlorothiazide (HYDRODIURIL) 25 MG tablet TAKE 1 TABLET BY MOUTH EVERY DAY  . magnesium oxide (MAG-OX) 400 MG tablet Take 400 mg by mouth daily.  . pantoprazole (PROTONIX) 40 MG tablet Take 1 tablet (40 mg total) by mouth 2 (two) times daily.  . Pyridoxine HCl (VITAMIN B-6) 250 MG tablet Take 250 mg by mouth daily.  Marland Kitchen ZETIA 10 MG tablet TAKE 1 TABLET EVERY DAY FOR CHOLESTEROL    Allergies  Allergen Reactions  . Lipitor [Atorvastatin] Anaphylaxis  . Effexor [Venlafaxine]   . Paxil [Paroxetine  Hcl]   . Wellbutrin [Bupropion]     Hives    Past Medical History  Diagnosis Date  . Complication of anesthesia     woke  up X 1  . Vitamin D deficiency   . Hypertension   . High cholesterol   . Acid reflux   . Cancer     Left Breast  . Arthritis   . Fatty liver     Past Surgical History  Procedure Laterality Date  . Tram  1999-2000  . Knee arthroscopy  1982  . Wisdom tooth extraction    . Cholecystectomy  11/19/2012    Procedure: LAPAROSCOPIC CHOLECYSTECTOMY WITH INTRAOPERATIVE CHOLANGIOGRAM;  Surgeon: Zenovia Jarred, MD;  Location: [facility];  Service: General;  Laterality: N/A;  . Breast surgery  1995    mastectomy left  . Hernia repair      Family History  Problem Relation Age of Onset  . Cancer Cousin     lung  . Hypertension Mother   . Hyperlipidemia Mother   . Hyperlipidemia Brother   . Hypertension Brother     History  Substance Use Topics  . Smoking status: Never Smoker   . Smokeless tobacco: Not on file  . Alcohol Use: Yes     Comment: rare    ROS Constitutional: Denies fever, chills, weight loss/gain, headaches, insomnia, fatigue, night sweats, and change in appetite. Eyes: Denies redness, blurred vision, diplopia, discharge, itchy, watery eyes.  ENT: Denies discharge,  congestion, post nasal drip, epistaxis, sore throat, earache, hearing loss, dental pain, Tinnitus, Vertigo, Sinus pain, snoring.  Cardio: Denies chest pain, palpitations, irregular heartbeat, syncope, dyspnea, diaphoresis, orthopnea, PND, claudication, edema Respiratory: denies cough, dyspnea, DOE, pleurisy, hoarseness, laryngitis, wheezing.  Gastrointestinal: Denies dysphagia, heartburn, reflux, water brash, pain, cramps, nausea, vomiting, bloating, diarrhea, constipation, hematemesis, melena, hematochezia, jaundice, hemorrhoids Genitourinary: Denies dysuria, frequency, urgency, nocturia, hesitancy, discharge, hematuria, flank pain Breast:Breast lumps, nipple discharge, bleeding.   Musculoskeletal: Denies arthralgia, myalgia, stiffness, Jt. Swelling, pain, limp, and strain/sprain. Skin: Denies puritis, rash, hives, warts, acne, eczema, changing in skin lesion Neuro: No weakness, tremor, incoordination, spasms, paresthesia, pain Psychiatric: Denies confusion, memory loss, sensory loss Endocrine: Denies change in weight, skin, hair change, nocturia, and paresthesia, diabetic polys, visual blurring, hyper / hypo glycemic episodes.  Heme/Lymph: No excessive bleeding, bruising, enlarged lymph nodes.   Physical Exam  BP 118/70  Pulse 68  Temp 98.1 F  Resp 16  Ht 5' 1.75"   Wt 176 lb 6.4 oz   BMI 32.54 kg/m2   General Appearance: Well nourished, in no apparent distress. Eyes: PERRLA, EOMs, conjunctiva no swelling or erythema, normal fundi and vessels. Sinuses: No frontal/maxillary tenderness ENT/Mouth: EACs patent / TMs  nl. Nares clear without erythema, swelling, mucoid exudates. Oral hygiene is good. No erythema, swelling, or exudate. Tongue normal, non-obstructing. Tonsils not swollen or erythematous. Hearing normal.  Neck: Supple, thyroid normal. No bruits, nodes or JVD. Respiratory: Respiratory effort normal.  BS equal and clear bilateral without rales, rhonci, wheezing or stridor. Cardio: Heart sounds are normal with regular rate and rhythm and no murmurs, rubs or gallops. Peripheral pulses are normal and equal bilaterally without edema. No aortic or femoral bruits. Chest: symmetric with normal excursions and percussion. Breasts: Symmetric, without lumps, nipple discharge, retractions, or fibrocystic changes. Post surgical scarring noted. Abdomen: Flat, soft, with bowl sounds. Nontender, no guarding, rebound, hernias, masses, or organomegaly.  Lymphatics: Non tender without lymphadenopathy.  Genitourinary:  Musculoskeletal: Full ROM all peripheral extremities, joint stability, 5/5 strength, and normal gait. Skin: Warm and dry without rashes, lesions, cyanosis,  clubbing or  ecchymosis.  Neuro: Cranial nerves intact, reflexes equal bilaterally. Normal muscle tone, no cerebellar symptoms. Sensation intact.  Pysch: Awake and oriented X 3, normal affect, Insight and Judgment appropriate.   Assessment and Plan  1. Annual Screening Examination 2. Hypertension  3. Hyperlipidemia 4. Pre Diabetes 5. Vitamin D Deficiency 6. Lt Breast Cancer  (1995)  Continue prudent diet as discussed, weight control, BP monitoring, regular exercise, and medications. Discussed med's effects and SE's. Screening labs and tests as requested with regular follow-up as recommended.

## 2014-03-14 NOTE — Patient Instructions (Signed)

## 2014-03-15 LAB — BASIC METABOLIC PANEL WITH GFR
BUN: 21 mg/dL (ref 6–23)
CO2: 26 mEq/L (ref 19–32)
Calcium: 10.1 mg/dL (ref 8.4–10.5)
Chloride: 101 mEq/L (ref 96–112)
Creat: 0.91 mg/dL (ref 0.50–1.10)
GFR, Est African American: 80 mL/min
GFR, Est Non African American: 69 mL/min
Glucose, Bld: 95 mg/dL (ref 70–99)
Potassium: 3.5 mEq/L (ref 3.5–5.3)
Sodium: 138 mEq/L (ref 135–145)

## 2014-03-15 LAB — HEPATITIS B CORE ANTIBODY, TOTAL: HEP B C TOTAL AB: REACTIVE — AB

## 2014-03-15 LAB — HIV ANTIBODY (ROUTINE TESTING W REFLEX): HIV: NONREACTIVE

## 2014-03-15 LAB — LIPID PANEL
CHOL/HDL RATIO: 3.1 ratio
CHOLESTEROL: 188 mg/dL (ref 0–200)
HDL: 60 mg/dL (ref >39)
LDL CALC: 119 mg/dL — AB (ref 0–99)
TRIGLYCERIDES: 46 mg/dL (ref <150)
VLDL: 9 mg/dL (ref 0–40)

## 2014-03-15 LAB — INSULIN, FASTING: Insulin fasting, serum: 17 u[IU]/mL (ref 3–28)

## 2014-03-15 LAB — HEPATIC FUNCTION PANEL
ALT: 23 U/L (ref 0–35)
AST: 20 U/L (ref 0–37)
Albumin: 4.8 g/dL (ref 3.5–5.2)
Alkaline Phosphatase: 62 U/L (ref 39–117)
Bilirubin, Direct: 0.1 mg/dL (ref 0.0–0.3)
Indirect Bilirubin: 0.3 mg/dL (ref 0.2–1.2)
Total Bilirubin: 0.4 mg/dL (ref 0.2–1.2)
Total Protein: 7.2 g/dL (ref 6.0–8.3)

## 2014-03-15 LAB — URINALYSIS, MICROSCOPIC ONLY
BACTERIA UA: NONE SEEN
Casts: NONE SEEN
Crystals: NONE SEEN
SQUAMOUS EPITHELIAL / LPF: NONE SEEN

## 2014-03-15 LAB — MAGNESIUM: Magnesium: 2.1 mg/dL (ref 1.5–2.5)

## 2014-03-15 LAB — TSH: TSH: 1.049 u[IU]/mL (ref 0.350–4.500)

## 2014-03-15 LAB — VITAMIN B12: Vitamin B-12: 879 pg/mL (ref 211–911)

## 2014-03-15 LAB — HEPATITIS A ANTIBODY, TOTAL: Hep A Total Ab: NONREACTIVE

## 2014-03-15 LAB — MICROALBUMIN / CREATININE URINE RATIO
CREATININE, URINE: 15.9 mg/dL
MICROALB/CREAT RATIO: 31.4 mg/g — AB (ref 0.0–30.0)
Microalb, Ur: 0.5 mg/dL (ref 0.00–1.89)

## 2014-03-15 LAB — HEPATITIS C ANTIBODY: HCV AB: NEGATIVE

## 2014-03-15 LAB — HEMOGLOBIN A1C
HEMOGLOBIN A1C: 6.1 % — AB (ref <5.7)
MEAN PLASMA GLUCOSE: 128 mg/dL — AB (ref <117)

## 2014-03-15 LAB — HEPATITIS B SURFACE ANTIBODY,QUALITATIVE: Hep B S Ab: POSITIVE — AB

## 2014-03-15 LAB — VITAMIN D 25 HYDROXY (VIT D DEFICIENCY, FRACTURES): Vit D, 25-Hydroxy: 107 ng/mL — ABNORMAL HIGH (ref 30–89)

## 2014-03-15 LAB — RPR

## 2014-03-16 LAB — HEPATITIS B E ANTIBODY: Hepatitis Be Antibody: REACTIVE — AB

## 2014-03-17 LAB — TB SKIN TEST
INDURATION: 0 mm
TB Skin Test: NEGATIVE

## 2014-05-22 ENCOUNTER — Other Ambulatory Visit: Payer: Self-pay | Admitting: Internal Medicine

## 2014-07-20 ENCOUNTER — Other Ambulatory Visit: Payer: Self-pay

## 2014-07-20 DIAGNOSIS — Z853 Personal history of malignant neoplasm of breast: Secondary | ICD-10-CM

## 2014-07-20 DIAGNOSIS — Z1231 Encounter for screening mammogram for malignant neoplasm of breast: Secondary | ICD-10-CM

## 2014-07-20 DIAGNOSIS — Z9012 Acquired absence of left breast and nipple: Secondary | ICD-10-CM

## 2014-08-02 ENCOUNTER — Other Ambulatory Visit: Payer: Self-pay | Admitting: Obstetrics and Gynecology

## 2014-08-02 DIAGNOSIS — Z9012 Acquired absence of left breast and nipple: Secondary | ICD-10-CM

## 2014-08-02 DIAGNOSIS — N63 Unspecified lump in unspecified breast: Secondary | ICD-10-CM

## 2014-08-04 ENCOUNTER — Other Ambulatory Visit: Payer: BC Managed Care – PPO

## 2014-08-08 ENCOUNTER — Ambulatory Visit
Admission: RE | Admit: 2014-08-08 | Discharge: 2014-08-08 | Disposition: A | Discharge status: Home / Self Care | Payer: BC Managed Care – PPO | Source: Ambulatory Visit | Attending: Obstetrics and Gynecology | Admitting: Obstetrics and Gynecology

## 2014-08-08 DIAGNOSIS — Z9012 Acquired absence of left breast and nipple: Secondary | ICD-10-CM

## 2014-08-08 DIAGNOSIS — N63 Unspecified lump in unspecified breast: Secondary | ICD-10-CM

## 2014-08-10 ENCOUNTER — Ambulatory Visit: Payer: BC Managed Care – PPO

## 2014-08-14 ENCOUNTER — Other Ambulatory Visit: Payer: Self-pay | Admitting: Emergency Medicine

## 2014-08-21 ENCOUNTER — Other Ambulatory Visit: Payer: Self-pay | Admitting: Internal Medicine

## 2014-09-11 ENCOUNTER — Other Ambulatory Visit: Payer: Self-pay | Admitting: Internal Medicine

## 2014-09-22 ENCOUNTER — Ambulatory Visit (INDEPENDENT_AMBULATORY_CARE_PROVIDER_SITE_OTHER): Payer: BC Managed Care – PPO | Admitting: Internal Medicine

## 2014-09-22 ENCOUNTER — Encounter: Payer: Self-pay | Admitting: Internal Medicine

## 2014-09-22 VITALS — BP 108/70 | HR 72 | Temp 97.6°F | Resp 16 | Ht 61.75 in | Wt 178.2 lb

## 2014-09-22 DIAGNOSIS — R7303 Prediabetes: Secondary | ICD-10-CM | POA: Insufficient documentation

## 2014-09-22 DIAGNOSIS — N182 Chronic kidney disease, stage 2 (mild): Secondary | ICD-10-CM | POA: Insufficient documentation

## 2014-09-22 DIAGNOSIS — R7309 Other abnormal glucose: Secondary | ICD-10-CM

## 2014-09-22 DIAGNOSIS — C50912 Malignant neoplasm of unspecified site of left female breast: Secondary | ICD-10-CM

## 2014-09-22 DIAGNOSIS — I10 Essential (primary) hypertension: Secondary | ICD-10-CM

## 2014-09-22 DIAGNOSIS — C50919 Malignant neoplasm of unspecified site of unspecified female breast: Secondary | ICD-10-CM

## 2014-09-22 DIAGNOSIS — E782 Mixed hyperlipidemia: Secondary | ICD-10-CM

## 2014-09-22 DIAGNOSIS — Z79899 Other long term (current) drug therapy: Secondary | ICD-10-CM

## 2014-09-22 DIAGNOSIS — E559 Vitamin D deficiency, unspecified: Secondary | ICD-10-CM

## 2014-09-22 HISTORY — DX: Malignant neoplasm of unspecified site of unspecified female breast: C50.919

## 2014-09-22 LAB — BASIC METABOLIC PANEL WITH GFR
BUN: 25 mg/dL — ABNORMAL HIGH (ref 6–23)
CHLORIDE: 100 meq/L (ref 96–112)
CO2: 27 mEq/L (ref 19–32)
CREATININE: 1 mg/dL (ref 0.50–1.10)
Calcium: 10.2 mg/dL (ref 8.4–10.5)
GFR, EST NON AFRICAN AMERICAN: 62 mL/min
GFR, Est African American: 71 mL/min
GLUCOSE: 94 mg/dL (ref 70–99)
POTASSIUM: 4.2 meq/L (ref 3.5–5.3)
Sodium: 138 mEq/L (ref 135–145)

## 2014-09-22 LAB — CBC WITH DIFFERENTIAL/PLATELET
Basophils Absolute: 0 10*3/uL (ref 0.0–0.1)
Basophils Relative: 0 % (ref 0–1)
EOS PCT: 3 % (ref 0–5)
Eosinophils Absolute: 0.1 10*3/uL (ref 0.0–0.7)
HEMATOCRIT: 35.1 % — AB (ref 36.0–46.0)
Hemoglobin: 12.1 g/dL (ref 12.0–15.0)
LYMPHS ABS: 1.7 10*3/uL (ref 0.7–4.0)
Lymphocytes Relative: 36 % (ref 12–46)
MCH: 29 pg (ref 26.0–34.0)
MCHC: 34.5 g/dL (ref 30.0–36.0)
MCV: 84.2 fL (ref 78.0–100.0)
MONO ABS: 0.4 10*3/uL (ref 0.1–1.0)
MONOS PCT: 9 % (ref 3–12)
NEUTROS ABS: 2.5 10*3/uL (ref 1.7–7.7)
Neutrophils Relative %: 52 % (ref 43–77)
Platelets: 302 10*3/uL (ref 150–400)
RBC: 4.17 MIL/uL (ref 3.87–5.11)
RDW: 13.5 % (ref 11.5–15.5)
WBC: 4.8 10*3/uL (ref 4.0–10.5)

## 2014-09-22 LAB — LIPID PANEL
Cholesterol: 184 mg/dL (ref 0–200)
HDL: 53 mg/dL (ref >39)
LDL Cholesterol: 121 mg/dL — ABNORMAL HIGH (ref 0–99)
TRIGLYCERIDES: 49 mg/dL (ref <150)
Total CHOL/HDL Ratio: 3.5 Ratio
VLDL: 10 mg/dL (ref 0–40)

## 2014-09-22 LAB — HEPATIC FUNCTION PANEL
ALK PHOS: 57 U/L (ref 39–117)
ALT: 21 U/L (ref 0–35)
AST: 19 U/L (ref 0–37)
Albumin: 4.5 g/dL (ref 3.5–5.2)
BILIRUBIN INDIRECT: 0.5 mg/dL (ref 0.2–1.2)
Bilirubin, Direct: 0.1 mg/dL (ref 0.0–0.3)
Total Bilirubin: 0.6 mg/dL (ref 0.2–1.2)
Total Protein: 7.2 g/dL (ref 6.0–8.3)

## 2014-09-22 LAB — TSH: TSH: 2.126 u[IU]/mL (ref 0.350–4.500)

## 2014-09-22 LAB — MAGNESIUM: Magnesium: 2.1 mg/dL (ref 1.5–2.5)

## 2014-09-22 LAB — HEMOGLOBIN A1C
Hgb A1c MFr Bld: 6.2 % — ABNORMAL HIGH (ref <5.7)
MEAN PLASMA GLUCOSE: 131 mg/dL — AB (ref <117)

## 2014-09-22 NOTE — Patient Instructions (Addendum)
   Recommend the book "The END of DIETING" by Dr Baker Janus   and the book "The END of DIABETES " by Dr Excell Seltzer  And the book "Super Immunity" by Dr Excell Seltzer  And the book "Anti-Cancer: A New Way of Life" by Dr Lendon Ka  At [email or web address] - get book & Audio CD's      Being diabetic has a  300% increased risk for heart attack, stroke, cancer, and alzheimer- type vascular dementia. It is very important that you work harder with diet by avoiding all foods that are white except chicken & fish. Avoid white rice (brown & wild rice is OK), white potatoes (sweetpotatoes in moderation is OK), White bread or wheat bread or anything made out of white flour like bagels, donuts, rolls, buns, biscuits, cakes, pastries, cookies, pizza crust, and pasta (made from white flour & egg whites) - vegetarian pasta or spinach or wheat pasta is OK. Multigrain breads like Arnold's or Pepperidge Farm, or multigrain sandwich thins or flatbreads.  Diet, exercise and weight loss can reverse and cure diabetes in the early stages.  Diet, exercise and weight loss is very important in the control and prevention of complications of diabetes which affects every system in your body, ie. Brain - dementia/stroke, eyes - glaucoma/blindness, heart - heart attack/heart failure, kidneys - dialysis, stomach - gastric paralysis, intestines - malabsorption, nerves - severe painful neuritis, circulation - gangrene & loss of a leg(s), and finally cancer and Alzheimers.    I recommend avoid fried & greasy foods,  sweets/candy, white rice (brown or wild rice or Quinoa is OK), white potatoes (sweet potatoes are OK) - anything made from white flour - bagels, doughnuts, rolls, buns, biscuits,white and wheat breads, pizza crust and traditional pasta made of white flour & egg white(vegetarian pasta or spinach or wheat pasta is OK).  Multi-grain bread is OK - like multi-grain flat bread or sandwich thins. Avoid alcohol in excess.  Exercise is also important.    Eat all the vegetables you want - avoid meat, especially red meat and dairy - especially cheese.  Cheese is the most concentrated form of trans-fats which is the worst thing to clog up our arteries. Veggie cheese is OK which can be found in the fresh produce section at [facility] or [employer] or [employer]

## 2014-09-22 NOTE — Progress Notes (Signed)
Patient ID: Jenna Miller, female   DOB: [birth year], 58 y.o.   MRN: [id number]   This very nice 58 y.o.MWF presents for 3 month follow up with Hypertension, Hyperlipidemia, Pre-Diabetes and Vitamin D Deficiency. Today in addition patient is c/o some cramping in the left calf  suspect for sciatic type pain.   Patient is treated for HTN & BP has been controlled at home. Today's BP: 108/70 mmHg. Patient denies any cardiac type chest pain, palpitations, dyspnea/orthopnea/PND, dizziness, claudication, or dependent edema.   Hyperlipidemia is not  controlled with diet & meds. Patient is allergic to statins.  Patient denies myalgias or other med SE's. Last Lipids were Total Chol 184; HDL Chol 53; LDL  121; Trig 49 on 09/22/2014.   Also, the patient has history of Morbid Obesity (BMI 32.54) and consequent PreDiabetes and patient denies any symptoms of reactive hypoglycemia, diabetic polys, paresthesias or visual blurring.  Last A1c was  6.2% on 09/22/2014.    Further, Patient has history of Vitamin D Deficiency and patient supplements vitamin D without any suspected side-effects. Last vitamin D was 107  On  03/14/2014.   Medication List   ALPRAZolam 1 MG tablet  Commonly known as:  XANAX  TAKE 1/2 TO 1 TAB 3 TIMES A DAY AS NEEDED      BENEFIBER DRINK MIX PO  Take 1 packet by mouth.     Biotin 5 MG Caps  Take by mouth.     CALCIUM 1000 + D  Take  daily.     calcium citrate / CALCITRATE 950 MG tablet  Take 1 tablet by mouth.     ezetimibe / ZETIA  10 MG tablet  Take 10 mg by mouth.     FREESTYLE LITE test strip  Generic drug:  glucose blood  USE TO CHECK GLUCOSE ONCE DAILY     gemfibrozil 600 MG tablet  TAKE 1 TABLET TWICE A DAY WITH FOOD FOR CHOLESTEROL     hydrochlorothiazide 25 MG tablet  TAKE 1 TABLET BY MOUTH EVERY DAY     magnesium oxide 400 (241.3 MG) MG tablet  Take 400 mg by mouth daily.     pantoprazole 40 MG tablet  TAKE 1 TABLET (40 MG TOTAL) BY MOUTH 2 (TWO) TIMES  DAILY.     vitamin B-12 100 MCG tablet  Take 100 mcg by mouth.     vitamin B-6 250 MG tablet  Take 250 mg by mouth daily.     Allergies  Allergen Reactions  . Lipitor [Atorvastatin] Anaphylaxis  . Effexor [Venlafaxine]   . Paxil [Paroxetine Hcl]   . Wellbutrin [Bupropion]     Hives   PMHx:   Past Medical History  Diagnosis Date  . Complication of anesthesia     woke  up X 1  . Vitamin D deficiency   . Hypertension   . High cholesterol   . Acid reflux   . Cancer     Left Breast  . Arthritis   . Fatty liver    FHx:    Reviewed / unchanged SHx:    Reviewed / unchanged  Systems Review:  Constitutional: Denies fever, chills, wt changes, headaches, insomnia, fatigue, night sweats, change in appetite. Eyes: Denies redness, blurred vision, diplopia, discharge, itchy, watery eyes.  ENT: Denies discharge, congestion, post nasal drip, epistaxis, sore throat, earache, hearing loss, dental pain, tinnitus, vertigo, sinus pain, snoring.  CV: Denies chest pain, palpitations, irregular heartbeat, syncope, dyspnea, diaphoresis, orthopnea, PND, claudication or edema. Respiratory: denies  cough, dyspnea, DOE, pleurisy, hoarseness, laryngitis, wheezing.  Gastrointestinal: Denies dysphagia, odynophagia, heartburn, reflux, water brash, abdominal pain or cramps, nausea, vomiting, bloating, diarrhea, constipation, hematemesis, melena, hematochezia  or hemorrhoids. Genitourinary: Denies dysuria, frequency, urgency, nocturia, hesitancy, discharge, hematuria or flank pain. Musculoskeletal: Denies arthralgias, myalgias, stiffness, jt. swelling, pain, limping or strain/sprain.  Skin: Denies pruritus, rash, hives, warts, acne, eczema or change in skin lesion(s). Neuro: No weakness, tremor, incoordination, spasms, paresthesia or pain. Psychiatric: Denies confusion, memory loss or sensory loss. Endo: Denies change in weight, skin or hair change.  Heme/Lymph: No excessive bleeding, bruising or enlarged  lymph nodes.  Exam:  BP 108/70  P 72  T 97.6 F   R 16  Ht 5' 1.75"  Wt 178 lb 3.2 oz   BMI 32.88   LMP 06/10/2010  Appears over nourished and in no distress. Eyes: PERRLA, EOMs, conjunctiva no swelling or erythema. Sinuses: No frontal/maxillary tenderness ENT/Mouth: EAC's clear, TM's nl w/o erythema, bulging. Nares clear w/o erythema, swelling, exudates. Oropharynx clear without erythema or exudates. Oral hygiene is good. Tongue normal, non obstructing. Hearing intact.  Neck: Supple. Thyroid nl. Car 2+/2+ without bruits, nodes or JVD. Chest: Respirations nl with BS clear & equal w/o rales, rhonchi, wheezing or stridor.  Cor: Heart sounds normal w/ regular rate and rhythm without sig. murmurs, gallops, clicks, or rubs. Peripheral pulses normal and equal  without edema.  Abdomen: Soft & bowel sounds normal. Non-tender w/o guarding, rebound, hernias, masses, or organomegaly.  Lymphatics: Unremarkable.  Musculoskeletal: Full ROM all peripheral extremities, joint stability, 5/5 strength, and normal gait. (-) SLR, Lt Skin: Warm, dry without exposed rashes, lesions or ecchymosis apparent. No Lt calf tenderness. (-) Homan's Sn. No cords. Neuro: Cranial nerves intact, reflexes equal bilaterally. Sensory-motor testing grossly intact. Tendon reflexes grossly intact.  Pysch: Alert & oriented x 3.  Insight and judgement nl & appropriate. No ideations.  Assessment and Plan:  1. Hypertension - Continue monitor blood pressure at home. Continue diet/meds same.  2. Hyperlipidemia - Continue diet/meds, exercise,& lifestyle modifications. Continue monitor periodic cholesterol/liver & renal functions   3. Pre-Diabetes - Continue diet, exercise, lifestyle modifications. Monitor appropriate labs.   4. Vitamin D Deficiency - Continue supplementation.  5. Suspect Lt. Sciatica - Pt declined trial on steroids.  Recommended regular exercise, BP monitoring, weight control, and discussed med and SE's.  Recommended labs to assess and monitor clinical status. Further disposition pending results of labs.

## 2014-09-23 LAB — VITAMIN D 25 HYDROXY (VIT D DEFICIENCY, FRACTURES): Vit D, 25-Hydroxy: 92 ng/mL — ABNORMAL HIGH (ref 30–89)

## 2014-09-23 LAB — INSULIN, FASTING: Insulin fasting, serum: 8.7 u[IU]/mL (ref 2.0–19.6)

## 2014-09-28 ENCOUNTER — Telehealth: Payer: Self-pay | Admitting: *Deleted

## 2014-09-28 NOTE — Telephone Encounter (Signed)
Pt aware of lab results from 09/22/2014.

## 2014-10-13 ENCOUNTER — Other Ambulatory Visit: Payer: Self-pay

## 2014-10-19 ENCOUNTER — Other Ambulatory Visit: Payer: Self-pay | Admitting: Internal Medicine

## 2014-12-29 DIAGNOSIS — T63301A Toxic effect of unspecified spider venom, accidental (unintentional), initial encounter: Secondary | ICD-10-CM

## 2014-12-29 HISTORY — DX: Toxic effect of unspecified spider venom, accidental (unintentional), initial encounter: T63.301A

## 2015-01-18 ENCOUNTER — Ambulatory Visit: Payer: Self-pay | Admitting: Physician Assistant

## 2015-02-07 ENCOUNTER — Other Ambulatory Visit: Payer: Self-pay | Admitting: *Deleted

## 2015-02-07 MED ORDER — GEMFIBROZIL 600 MG PO TABS: ORAL_TABLET | ORAL | Status: DC

## 2015-02-07 MED ORDER — EZETIMIBE 10 MG PO TABS: 10.0000 mg | ORAL_TABLET | Freq: Every day | ORAL | Status: DC

## 2015-02-08 ENCOUNTER — Other Ambulatory Visit: Payer: Self-pay | Admitting: Internal Medicine

## 2015-02-08 ENCOUNTER — Other Ambulatory Visit: Payer: Self-pay | Admitting: Emergency Medicine

## 2015-02-16 ENCOUNTER — Encounter: Payer: Self-pay | Admitting: Physician Assistant

## 2015-02-16 ENCOUNTER — Ambulatory Visit (INDEPENDENT_AMBULATORY_CARE_PROVIDER_SITE_OTHER): Payer: BLUE CROSS/BLUE SHIELD | Admitting: Physician Assistant

## 2015-02-16 VITALS — BP 110/78 | HR 68 | Temp 98.2°F | Resp 16 | Ht 61.75 in | Wt 185.0 lb

## 2015-02-16 DIAGNOSIS — E782 Mixed hyperlipidemia: Secondary | ICD-10-CM

## 2015-02-16 DIAGNOSIS — I10 Essential (primary) hypertension: Secondary | ICD-10-CM

## 2015-02-16 DIAGNOSIS — M6530 Trigger finger, unspecified finger: Secondary | ICD-10-CM

## 2015-02-16 DIAGNOSIS — R7309 Other abnormal glucose: Secondary | ICD-10-CM

## 2015-02-16 DIAGNOSIS — R7303 Prediabetes: Secondary | ICD-10-CM

## 2015-02-16 DIAGNOSIS — E559 Vitamin D deficiency, unspecified: Secondary | ICD-10-CM

## 2015-02-16 DIAGNOSIS — Z79899 Other long term (current) drug therapy: Secondary | ICD-10-CM

## 2015-02-16 DIAGNOSIS — N182 Chronic kidney disease, stage 2 (mild): Secondary | ICD-10-CM

## 2015-02-16 LAB — CBC WITH DIFFERENTIAL/PLATELET
BASOS PCT: 1 % (ref 0–1)
Basophils Absolute: 0.1 10*3/uL (ref 0.0–0.1)
EOS PCT: 2 % (ref 0–5)
Eosinophils Absolute: 0.1 10*3/uL (ref 0.0–0.7)
HCT: 36.1 % (ref 36.0–46.0)
Hemoglobin: 12.2 g/dL (ref 12.0–15.0)
Lymphocytes Relative: 31 % (ref 12–46)
Lymphs Abs: 1.9 10*3/uL (ref 0.7–4.0)
MCH: 28.8 pg (ref 26.0–34.0)
MCHC: 33.8 g/dL (ref 30.0–36.0)
MCV: 85.1 fL (ref 78.0–100.0)
MPV: 10.7 fL (ref 8.6–12.4)
Monocytes Absolute: 0.5 10*3/uL (ref 0.1–1.0)
Monocytes Relative: 8 % (ref 3–12)
Neutro Abs: 3.6 10*3/uL (ref 1.7–7.7)
Neutrophils Relative %: 58 % (ref 43–77)
PLATELETS: 283 10*3/uL (ref 150–400)
RBC: 4.24 MIL/uL (ref 3.87–5.11)
RDW: 14.3 % (ref 11.5–15.5)
WBC: 6.2 10*3/uL (ref 4.0–10.5)

## 2015-02-16 NOTE — Patient Instructions (Addendum)
Trigger Finger Trigger finger (digital tendinitis and stenosing tenosynovitis) is a common disorder that causes an often painful catching of the fingers or thumb. It occurs as a clicking, snapping, or locking of a finger in the palm of the hand. This is caused by a problem with the tendons that flex or bend the fingers sliding smoothly through their sheaths. The condition may occur in any finger or a couple fingers at the same time.  The finger may lock with the finger curled or suddenly straighten out with a snap. This is more common in patients with rheumatoid arthritis and diabetes. Left untreated, the condition may get worse to the point where the finger becomes locked in flexion, like making a fist, or less commonly locked with the finger straightened out. CAUSES   Inflammation and scarring that lead to swelling around the tendon sheath.  Repeated or forceful movements.  Rheumatoid arthritis, an autoimmune disease that affects joints.  Gout.  Diabetes mellitus. SIGNS AND SYMPTOMS  Soreness and swelling of your finger.  A painful clicking or snapping as you bend and straighten your finger. DIAGNOSIS  Your health care provider will do a physical exam of your finger to diagnose trigger finger. TREATMENT   Splinting for 6-8 weeks may be helpful.  Nonsteroidal anti-inflammatory medicines (NSAIDs) can help to relieve the pain and inflammation.  Cortisone injections, along with splinting, may speed up recovery. Several injections may be required. Cortisone may give relief after one injection.  Surgery is another treatment that may be used if conservative treatments do not work. Surgery can be minor, without incisions (a cut does not have to be made), and can be done with a needle through the skin.  Other surgical choices involve an open procedure in which the surgeon opens the hand through a small incision and cuts the pulley so the tendon can again slide smoothly. Your hand will still  work fine. HOME CARE INSTRUCTIONS  Apply ice to the injured area, twice per day:  Put ice in a plastic bag.  Place a towel between your skin and the bag.  Leave the ice on for 20 minutes, 3-4 times a day.  Rest your hand often. MAKE SURE YOU:   Understand these instructions.  Will watch your condition.  Will get help right away if you are not doing well or get worse. Document Released: 10/04/2004 Document Revised: 08/17/2013 Document Reviewed: 05/17/2013 [facility] Patient Information 2015 [place], [place]. This information is not intended to replace advice given to you by your health care provider. Make sure you discuss any questions you have with your health care provider.  Before you even begin to attack a weight-loss plan, it pays to remember this: You are not fat. You have fat. Losing weight isn't about blame or shame; it's simply another achievement to accomplish. Dieting is like any other skill-you have to buckle down and work at it. As long as you act in a smart, reasonable way, you'll ultimately get where you want to be. Here are some weight loss pearls for you.  1. It's Not a Diet. It's a Lifestyle Thinking of a diet as something you're on and suffering through only for the short term doesn't work. To shed weight and keep it off, you need to make permanent changes to the way you eat. It's OK to indulge occasionally, of course, but if you cut calories temporarily and then revert to your old way of eating, you'll gain back the weight quicker than you can say yo-yo.  Use it to lose it. Research shows that one of the best predictors of long-term weight loss is how many pounds you drop in the first month. For that reason, nutritionists often suggest being stricter for the first two weeks of your new eating strategy to build momentum. Cut out added sugar and alcohol and avoid unrefined carbs. After that, figure out how you can reincorporate them in a way that's healthy and maintainable.  2.  There's a Right Way to Exercise Working out burns calories and fat and boosts your metabolism by building muscle. But those trying to lose weight are notorious for overestimating the number of calories they burn and underestimating the amount they take in. Unfortunately, your system is biologically programmed to hold on to extra pounds and that means when you start exercising, your body senses the deficit and ramps up its hunger signals. If you're not diligent, you'll eat everything you burn and then some. Use it to lose it. Cardio gets all the exercise glory, but strength and interval training are the real heroes. They help you build lean muscle, which in turn increases your metabolism and calorie-burning ability 3. Don't Overreact to Mild Hunger Some people have a hard time losing weight because of hunger anxiety. To them, being hungry is bad-something to be avoided at all costs-so they carry snacks with them and eat when they don't need to. Others eat because they're stressed out or bored. While you never want to get to the point of being ravenous (that's when bingeing is likely to happen), a hunger pang, a craving, or the fact that it's 3:00 p.m. should not send you racing for the vending machine or obsessing about the energy bar in your purse. Ideally, you should put off eating until your stomach is growling and it's difficult to concentrate.  Use it to lose it. When you feel the urge to eat, use the HALT method. Ask yourself, Am I really hungry? Or am I angry or anxious, lonely or bored, or tired? If you're still not certain, try the apple test. If you're truly hungry, an apple should seem delicious; if it doesn't, something else is going on. Or you can try drinking water and making yourself busy, if you are still hungry try a healthy snack.  4. Not All Calories Are Created Equal The mechanics of weight loss are pretty simple: Take in fewer calories than you use for energy. But the kind of food you eat  makes all the difference. Processed food that's high in saturated fat and refined starch or sugar can cause inflammation that disrupts the hormone signals that tell your brain you're full. The result: You eat a lot more.  Use it to lose it. Clean up your diet. Swap in whole, unprocessed foods, including vegetables, lean protein, and healthy fats that will fill you up and give you the biggest nutritional bang for your calorie buck. In a few weeks, as your brain starts receiving regular hunger and fullness signals once again, you'll notice that you feel less hungry overall and naturally start cutting back on the amount you eat.  5. Protein, Produce, and Plant-Based Fats Are Your Weight-Loss Trinity Here's why eating the three Ps regularly will help you drop pounds. Protein fills you up. You need it to build lean muscle, which keeps your metabolism humming so that you can torch more fat. People in a weight-loss program who ate double the recommended daily allowance for protein (about 110 grams for a 150-pound woman) lost  70 percent of their weight from fat, while people who ate the RDA lost only about 40 percent, one study found. Produce is packed with filling fiber. "It's very difficult to consume too many calories if you're eating a lot of vegetables. Example: Three cups of broccoli is a lot of food, yet only 93 calories. (Fruit is another story. It can be easy to overeat and can contain a lot of calories from sugar, so be sure to monitor your intake.) Plant-based fats like olive oil and those in avocados and nuts are healthy and extra satiating.  Use it to lose it. Aim to incorporate each of the three Ps into every meal and snack. People who eat protein throughout the day are able to keep weight off, according to a study in the [place] of Clinical Nutrition. In addition to meat, poultry and seafood, good sources are beans, lentils, eggs, tofu, and yogurt. As for fat, keep portion sizes in check by  measuring out salad dressing, oil, and nut butters (shoot for one to two tablespoons). Finally, eat veggies or a little fruit at every meal. People who did that consumed 308 fewer calories but didn't feel any hungrier than when they didn't eat more produce.  7. How You Eat Is As Important As What You Eat In order for your brain to register that you're full, you need to focus on what you're eating. Sit down whenever you eat, preferably at a table. Turn off the TV or computer, put down your phone, and look at your food. Smell it. Chew slowly, and don't put another bite on your fork until you swallow. When women ate lunch this attentively, they consumed 30 percent less when snacking later than those who listened to an audiobook at lunchtime, according to a study in the [place] of Nutrition. 8. Weighing Yourself Really Works The scale provides the best evidence about whether your efforts are paying off. Seeing the numbers tick up or down or stagnate is motivation to keep going-or to rethink your approach. A 2015 study at [facility] found that daily weigh-ins helped people lose more weight, keep it off, and maintain that loss, even after two years. Use it to lose it. Step on the scale at the same time every day for the best results. If your weight shoots up several pounds from one weigh-in to the next, don't freak out. Eating a lot of salt the night before or having your period is the likely culprit. The number should return to normal in a day or two. It's a steady climb that you need to do something about. 9. Too Much Stress and Too Little Sleep Are Your Enemies When you're tired and frazzled, your body cranks up the production of cortisol, the stress hormone that can cause carb cravings. Not getting enough sleep also boosts your levels of ghrelin, a hormone associated with hunger, while suppressing leptin, a hormone that signals fullness and satiety. People on a diet who slept only five and a  half hours a night for two weeks lost 55 percent less fat and were hungrier than those who slept eight and a half hours, according to a study in the [place]. Use it to lose it. Prioritize sleep, aiming for seven hours or more a night, which research shows helps lower stress. And make sure you're getting quality zzz's. If a snoring spouse or a fidgety cat wakes you up frequently throughout the night, you may end up getting the equivalent  of just four hours of sleep, according to a study from [facility]. Keep pets out of the bedroom, and use a white-noise app to drown out snoring. 10. You Will Hit a plateau-And You Can Bust Through It As you slim down, your body releases much less leptin, the fullness hormone.  If you're not strength training, start right now. Building muscle can raise your metabolism to help you overcome a plateau. To keep your body challenged and burning calories, incorporate new moves and more intense intervals into your workouts or add another sweat session to your weekly routine. Alternatively, cut an extra 100 calories or so a day from your diet. Now that you've lost weight, your body simply doesn't need as much fuel.   Ways to cut 100 calories  1. Eat your eggs with hot sauce OR salsa instead of cheese.  Eggs are great for breakfast, but many people consider eggs and cheese to be BFFs. Instead of cheese-1 oz. of cheddar has 114 calories-top your eggs with hot sauce, which contains no calories and helps with satiety and metabolism. Salsa is also a great option!!  2. Top your toast, waffles or pancakes with mashed berries instead of jelly or syrup. Half a cup of berries-fresh, frozen or thawed-has about 40 calories, compared with 2 tbsp. of maple syrup or jelly, which both have about 100 calories. The berries will also give you a good punch of fiber, which helps keep you full and satisfied and won't spike blood sugar quickly like the jelly or  syrup. 3. Swap the non-fat latte for black coffee with a splash of half-and-half. Contrary to its name, that non-fat latte has 130 calories and a startling 19g of carbohydrates per 16 oz. serving. Replacing that 'light' drinkable dessert with a black coffee with a splash of half-and-half saves you more than 100 calories per 16 oz. serving. 4. Sprinkle salads with freeze-dried raspberries instead of dried cranberries. If you want a sweet addition to your nutritious salad, stay away from dried cranberries. They have a whopping 130 calories per  cup and 30g carbohydrates. Instead, sprinkle freeze-dried raspberries guilt-free and save more than 100 calories per  cup serving, adding 3g of belly-filling fiber. 5. Go for mustard in place of mayo on your sandwich. Mustard can add really nice flavor to any sandwich, and there are tons of varieties, from spicy to honey. A serving of mayo is 95 calories, versus 10 calories in a serving of mustard. 6. Choose a DIY salad dressing instead of the store-bought kind. Mix Dijon or whole grain mustard with low-fat Kefir or red wine vinegar and garlic. 7. Use hummus as a spread instead of a dip. Use hummus as a spread on a high-fiber cracker or tortilla with a sandwich and save on calories without sacrificing taste. 8. Pick just one salad "accessory." Salad isn't automatically a calorie winner. It's easy to over-accessorize with toppings. Instead of topping your salad with nuts, avocado and cranberries (all three will clock in at 313 calories), just pick one. The next day, choose a different accessory, which will also keep your salad interesting. You don't wear all your jewelry every day, right? 9. Ditch the white pasta in favor of spaghetti squash. One cup of cooked spaghetti squash has about 40 calories, compared with traditional spaghetti, which comes with more than 200. Spaghetti squash is also nutrient-dense. It's a good source of fiber and Vitamins A and C, and it  can be eaten just like you would eat  pasta-with a great tomato sauce and [place] meatballs or with pesto, tofu and spinach, for example. 10. Dress up your chili, soups and stews with non-fat [place] yogurt instead of sour cream. Just a 'dollop' of sour cream can set you back 115 calories and a whopping 12g of fat-seven of which are of the artery-clogging variety. Added bonus: [place] yogurt is packed with muscle-building protein, calcium and B Vitamins. 11. Mash cauliflower instead of mashed potatoes. One cup of traditional mashed potatoes-in all their creamy goodness-has more than 200 calories, compared to mashed cauliflower, which you can typically eat for less than 100 calories per 1 cup serving. Cauliflower is a great source of the antioxidant indole-3-carbinol (I3C), which may help reduce the risk of some cancers, like breast cancer. 12. Ditch the ice cream sundae in favor of a [place] yogurt parfait. Instead of a cup of ice cream or fro-yo for dessert, try 1 cup of nonfat Greek yogurt topped with fresh berries and a sprinkle of cacao nibs. Both toppings are packed with antioxidants, which can help reduce cellular inflammation and oxidative damage. And the comparison is a no-brainer: One cup of ice cream has about 275 calories; one cup of frozen yogurt has about 230; and a cup of Greek yogurt has just 130, plus twice the protein, so you're less likely to return to the freezer for a second helping. 13. Put olive oil in a spray container instead of using it directly from the bottle. Each tablespoon of olive oil is 120 calories and 15g of fat. Use a mister instead of pouring it straight into the pan or onto a salad. This allows for portion control and will save you more than 100 calories. 14. When baking, substitute canned pumpkin for butter or oil. Canned pumpkin-not pumpkin pie mix-is loaded with Vitamin A, which is important for skin and eye health, as well as immunity. And the comparisons are pretty crazy:   cup of canned pumpkin has about 40 calories, compared to butter or oil, which has more than 800 calories. Yes, 800 calories. Applesauce and mashed banana can also serve as good substitutions for butter or oil, usually in a 1:1 ratio. 15. Top casseroles with high-fiber cereal instead of breadcrumbs. Breadcrumbs are typically made with white bread, while breakfast cereals contain 5-9g of fiber per serving. Not only will you save more than 150 calories per  cup serving, the swap will also keep you more full and you'll get a metabolism boost from the added fiber. 16. Snack on pistachios instead of macadamia nuts. Believe it or not, you get the same amount of calories from 35 pistachios (100 calories) as you would from only five macadamia nuts. 17. Chow down on kale chips rather than potato chips. This is my favorite 'don't knock it 'till you try it' swap. Kale chips are so easy to make at home, and you can spice them up with a little grated parmesan or chili powder. Plus, they're a mere fraction of the calories of potato chips, but with the same crunch factor we crave so often. 18. Add seltzer and some fruit slices to your cocktail instead of soda or fruit juice. One cup of soda or fruit juice can pack on as much as 140 calories. Instead, use seltzer and fruit slices. The fruit provides valuable phytochemicals, such as flavonoids and anthocyanins, which help to combat cancer and stave off the aging process.  Nexium/protonix/prilosec are called PPI's, they are great at healing your stomach but should only be  taken for a short period of time.   Studies are showing that taken for a long time it can increase the risk of osteoporosis (weakening of your bones), pneumonia, low magnesium, restless legs, Cdiff (infection that causes diarrhea), and most recently kidney disease/insufficiency.  Due to this information we want to try to stop the PPI but if you try to stop it abruptly this can cause rebound acid and  worsening symptoms.   So this is how we want you to get off the PPI: - Stop nexium/protonix/prilosec or which every PPI you are on - start to take pepcid or zantac (generic is fine) 2 x a day for 2 weeks - then once at night for 2 weeks - you can continue on this once at night or stop all together - Avoid alcohol, spicy foods, NSAIDS (aleve, ibuprofen) at this time. See foods below.   Food Choices for Gastroesophageal Reflux Disease When you have gastroesophageal reflux disease (GERD), the foods you eat and your eating habits are very important. Choosing the right foods can help ease the discomfort of GERD. WHAT GENERAL GUIDELINES DO I NEED TO FOLLOW?  Choose fruits, vegetables, whole grains, low-fat dairy products, and low-fat meat, fish, and poultry.  Limit fats such as oils, salad dressings, butter, nuts, and avocado.  Keep a food diary to identify foods that cause symptoms.  Avoid foods that cause reflux. These may be different for different people.  Eat frequent small meals instead of three large meals each day.  Eat your meals slowly, in a relaxed setting.  Limit fried foods.  Cook foods using methods other than frying.  Avoid drinking alcohol.  Avoid drinking large amounts of liquids with your meals.  Avoid bending over or lying down until 2-3 hours after eating. WHAT FOODS ARE NOT RECOMMENDED? The following are some foods and drinks that may worsen your symptoms: Vegetables Tomatoes. Tomato juice. Tomato and spaghetti sauce. Chili peppers. Onion and garlic. Horseradish. Fruits Oranges, grapefruit, and lemon (fruit and juice). Meats High-fat meats, fish, and poultry. This includes hot dogs, ribs, ham, sausage, salami, and bacon. Dairy Whole milk and chocolate milk. Sour cream. Cream. Butter. Ice cream. Cream cheese.  Beverages Coffee and tea, with or without caffeine. Carbonated beverages or energy drinks. Condiments Hot sauce. Barbecue sauce.   Sweets/Desserts Chocolate and cocoa. Donuts. Peppermint and spearmint. Fats and Oils High-fat foods, including [place] fries and potato chips. Other Vinegar. Strong spices, such as black pepper, white pepper, red pepper, cayenne, curry powder, cloves, ginger, and chili powder.

## 2015-02-16 NOTE — Progress Notes (Signed)
Assessment and Plan:  Hypertension: Continue medication, monitor blood pressure at home. Continue DASH diet.  Reminder to go to the ER if any CP, SOB, nausea, dizziness, severe HA, changes vision/speech, left arm numbness and tingling, and jaw pain. Cholesterol: Continue diet and exercise. Check cholesterol. Given zetia coupon.  Pre-diabetes-Continue diet and exercise. Check A1C Vitamin D Def- check level and continue medications. GERD- monitor diet, if worse call office, can do zantac at night, Discussed getting off PPI.  Trigger finger- will refer to previous surgeon, will find name.  Obesity with co morbidities- long discussion about weight loss, diet, and exercise,  Leg pain- check labs, Mg, potassium, etc.  Spider bite- well healing patient reassured.   Continue diet and meds as discussed. Further disposition pending results of labs. OVER 40 minutes of exam, counseling, chart review, referral performed  HPI 59 y.o. female  presents for over due 3 month follow up with hypertension, hyperlipidemia, prediabetes and vitamin D and a multitude of symptoms.   Her blood pressure has been controlled at home, today their BP is BP: 110/78 mmHg  She does not workout. She denies chest pain, shortness of breath, dizziness.  She is on cholesterol medication and denies myalgias. Her cholesterol is not at goal, less than 100. The cholesterol last visit was:   Lab Results  Component Value Date   CHOL 184 09/22/2014   HDL 53 09/22/2014   LDLCALC 121* 09/22/2014   TRIG 49 09/22/2014   CHOLHDL 3.5 09/22/2014   She has been working on diet and exercise for prediabetes, and denies paresthesia of the feet, polydipsia, polyuria and visual disturbances. Last A1C in the office was:  Lab Results  Component Value Date   HGBA1C 6.2* 09/22/2014  Patient is on Vitamin D supplement.   Lab Results  Component Value Date   VD25OH 27* 09/22/2014   Right handed female complains of bilateral hand pain, will have  locking,cliicking on middle finger bilateral hands, left worse than right, has been renovating house and has broken her left thumb before with plates and would like to see that doctor again, does not recall name.  BMI is Body mass index is 34.13 kg/(m^2)., she is working on diet and exercise, frustrated with weight, has been trying to eat well for several weeks, at first was doing  Wt Readings from Last 3 Encounters:  02/16/15 185 lb (83.915 kg)  09/22/14 178 lb 3.2 oz (80.831 kg)  03/14/14 176 lb 6.4 oz (80.015 kg)  Increasing GERD for 2 weeks, on protonix, happening at night, no diarrhea, N,V, fever chills, states it has been improving. Denies NSAIDS Had previous spider bite on left periumbilical, wants examined.  Has occ leg pain at night, no weakness, no pain while walking/during the day.   Current Medications:  Current Outpatient Prescriptions on File Prior to Visit  Medication Sig Dispense Refill  . ALPRAZolam (XANAX) 1 MG tablet TAKE 1/2 TO 1 TABLET BY MOUTH 3 TIMES A DAY AS NEEDED FOR ANXIETY OR SLEEP 90 tablet 0  . Biotin 5 MG CAPS Take by mouth.    . Calcium Carb-Cholecalciferol (CALCIUM 1000 + D PO) Take by mouth daily.    Marland Kitchen ezetimibe (ZETIA) 10 MG tablet Take 1 tablet (10 mg total) by mouth daily. 90 tablet 1  . FREESTYLE LITE test strip USE TO CHECK GLUCOSE ONCE DAILY 100 each 3  . gemfibrozil (LOPID) 600 MG tablet TAKE 1 TABLET TWICE A DAY WITH FOOD FOR CHOLESTEROL 180 tablet 1  .  hydrochlorothiazide (HYDRODIURIL) 25 MG tablet TAKE 1 TABLET BY MOUTH EVERY DAY 90 tablet 3  . magnesium oxide (MAG-OX) 400 (241.3 MG) MG tablet Take 400 mg by mouth daily.    . pantoprazole (PROTONIX) 40 MG tablet TAKE 1 TABLET (40 MG TOTAL) BY MOUTH 2 (TWO) TIMES DAILY. 180 tablet 1  . Pyridoxine HCl (VITAMIN B-6) 250 MG tablet Take 250 mg by mouth daily.    . vitamin B-12 (CYANOCOBALAMIN) 100 MCG tablet Take 100 mcg by mouth.    . Wheat Dextrin (BENEFIBER DRINK MIX PO) Take 1 packet by mouth.      No current facility-administered medications on file prior to visit.   Medical History:  Past Medical History  Diagnosis Date  . Complication of anesthesia     woke  up X 1  . Vitamin D deficiency   . Hypertension   . High cholesterol   . Acid reflux   . Cancer     Left Breast  . Arthritis   . Fatty liver    Allergies:  Allergies  Allergen Reactions  . Lipitor [Atorvastatin] Anaphylaxis  . Effexor [Venlafaxine]   . Paxil [Paroxetine Hcl]   . Wellbutrin [Bupropion]     Hives    Review of Systems:  Review of Systems  Constitutional: Positive for malaise/fatigue. Negative for fever, chills, weight loss and diaphoresis.  HENT: Negative.   Eyes: Negative.   Respiratory: Negative.   Cardiovascular: Negative.   Gastrointestinal: Positive for heartburn. Negative for nausea, vomiting, abdominal pain, diarrhea, constipation, blood in stool and melena.  Musculoskeletal: Positive for myalgias and joint pain. Negative for back pain, falls and neck pain.  Skin: Positive for rash. Negative for itching.  Neurological: Negative.  Negative for weakness.  Psychiatric/Behavioral: Negative.     Family history- Review and unchanged Social history- Review and unchanged Physical Exam: BP 110/78 mmHg  Pulse 68  Temp(Src) 98.2 F (36.8 C)  Resp 16  Ht 5' 1.75" (1.568 m)  Wt 185 lb (83.915 kg)  BMI 34.13 kg/m2  LMP 06/10/2010 Wt Readings from Last 3 Encounters:  02/16/15 185 lb (83.915 kg)  09/22/14 178 lb 3.2 oz (80.831 kg)  03/14/14 176 lb 6.4 oz (80.015 kg)   General Appearance: Well nourished, in no apparent distress. Eyes: PERRLA, EOMs, conjunctiva no swelling or erythema Sinuses: No Frontal/maxillary tenderness ENT/Mouth: Ext aud canals clear, TMs without erythema, bulging. No erythema, swelling, or exudate on post pharynx.  Tonsils not swollen or erythematous. Hearing normal.  Neck: Supple, thyroid normal.  Respiratory: Respiratory effort normal, BS equal bilaterally  without rales, rhonchi, wheezing or stridor.  Cardio: RRR with no MRGs. Brisk peripheral pulses without edema.  Abdomen: Soft, + BS,  Non tender AB, no guarding, rebound, hernias, masses. Lymphatics: Non tender without lymphadenopathy.  Musculoskeletal: Full ROM, 5/5 strength, Normal gait. + catching and popping left 3rd finger, and + MCP swelling mild.  Skin: Warm, dry without rashes, lesions, ecchymosis. Well healing hypopigmented nodule on left periumbilical.  Neuro: Cranial nerves intact. Normal muscle tone, no cerebellar symptoms. Psych: Awake and oriented X 3, normal affect, Insight and Judgment appropriate.    Vicie Mutters, PA-C 9:34 AM [facility]

## 2015-02-17 LAB — BASIC METABOLIC PANEL WITH GFR
BUN: 17 mg/dL (ref 6–23)
CALCIUM: 9.7 mg/dL (ref 8.4–10.5)
CO2: 26 meq/L (ref 19–32)
Chloride: 99 mEq/L (ref 96–112)
Creat: 0.8 mg/dL (ref 0.50–1.10)
GFR, Est African American: >89 mL/min
GFR, Est Non African American: 81 mL/min
GLUCOSE: 83 mg/dL (ref 70–99)
POTASSIUM: 3.8 meq/L (ref 3.5–5.3)
Sodium: 140 mEq/L (ref 135–145)

## 2015-02-17 LAB — TSH: TSH: 1.79 u[IU]/mL (ref 0.350–4.500)

## 2015-02-17 LAB — HEPATIC FUNCTION PANEL
ALK PHOS: 60 U/L (ref 39–117)
ALT: 21 U/L (ref 0–35)
AST: 20 U/L (ref 0–37)
Albumin: 4.7 g/dL (ref 3.5–5.2)
BILIRUBIN INDIRECT: 0.4 mg/dL (ref 0.2–1.2)
Bilirubin, Direct: 0.1 mg/dL (ref 0.0–0.3)
TOTAL PROTEIN: 7.1 g/dL (ref 6.0–8.3)
Total Bilirubin: 0.5 mg/dL (ref 0.2–1.2)

## 2015-02-17 LAB — LIPID PANEL
Cholesterol: 169 mg/dL (ref 0–200)
HDL: 57 mg/dL (ref >39)
LDL CALC: 103 mg/dL — AB (ref 0–99)
Total CHOL/HDL Ratio: 3 Ratio
Triglycerides: 47 mg/dL (ref <150)
VLDL: 9 mg/dL (ref 0–40)

## 2015-02-17 LAB — INSULIN, FASTING: Insulin fasting, serum: 11.3 u[IU]/mL (ref 2.0–19.6)

## 2015-02-17 LAB — MAGNESIUM: Magnesium: 2.1 mg/dL (ref 1.5–2.5)

## 2015-02-17 LAB — HEMOGLOBIN A1C
HEMOGLOBIN A1C: 6.2 % — AB (ref <5.7)
MEAN PLASMA GLUCOSE: 131 mg/dL — AB (ref <117)

## 2015-02-17 LAB — VITAMIN D 25 HYDROXY (VIT D DEFICIENCY, FRACTURES): VIT D 25 HYDROXY: 58 ng/mL (ref 30–100)

## 2015-02-19 ENCOUNTER — Encounter: Payer: Self-pay | Admitting: Physician Assistant

## 2015-02-19 ENCOUNTER — Other Ambulatory Visit: Payer: Self-pay | Admitting: Internal Medicine

## 2015-02-19 DIAGNOSIS — K219 Gastro-esophageal reflux disease without esophagitis: Secondary | ICD-10-CM

## 2015-02-19 MED ORDER — RANITIDINE HCL 300 MG PO TABS: ORAL_TABLET | ORAL | Status: DC

## 2015-02-20 ENCOUNTER — Encounter: Payer: Self-pay | Admitting: Internal Medicine

## 2015-02-20 ENCOUNTER — Telehealth: Payer: Self-pay

## 2015-02-20 NOTE — Telephone Encounter (Signed)
Patient called requesting referral to see Dr. Burney Gauze for Tx of trigger finger. Rx was faxed to Dr. Burney Gauze. Patient aware.

## 2015-03-08 ENCOUNTER — Other Ambulatory Visit: Payer: Self-pay | Admitting: Emergency Medicine

## 2015-03-12 ENCOUNTER — Other Ambulatory Visit: Payer: Self-pay | Admitting: Internal Medicine

## 2015-03-12 DIAGNOSIS — K219 Gastro-esophageal reflux disease without esophagitis: Secondary | ICD-10-CM

## 2015-03-12 DIAGNOSIS — E782 Mixed hyperlipidemia: Secondary | ICD-10-CM

## 2015-03-12 DIAGNOSIS — I10 Essential (primary) hypertension: Secondary | ICD-10-CM

## 2015-03-12 MED ORDER — SUCRALFATE 1 G PO TABS: ORAL_TABLET | ORAL | Status: DC

## 2015-03-12 MED ORDER — EZETIMIBE 10 MG PO TABS: 10.0000 mg | ORAL_TABLET | Freq: Every day | ORAL | Status: DC

## 2015-03-12 MED ORDER — HYDROCHLOROTHIAZIDE 25 MG PO TABS: ORAL_TABLET | ORAL | Status: DC

## 2015-03-15 ENCOUNTER — Encounter: Payer: Self-pay | Admitting: Internal Medicine

## 2015-03-19 ENCOUNTER — Encounter: Payer: Self-pay | Admitting: Internal Medicine

## 2015-05-07 ENCOUNTER — Other Ambulatory Visit: Payer: Self-pay

## 2015-05-07 DIAGNOSIS — K219 Gastro-esophageal reflux disease without esophagitis: Secondary | ICD-10-CM

## 2015-05-07 MED ORDER — RANITIDINE HCL 300 MG PO TABS: ORAL_TABLET | ORAL | Status: DC

## 2015-07-11 ENCOUNTER — Encounter: Payer: Self-pay | Admitting: Internal Medicine

## 2015-07-11 ENCOUNTER — Ambulatory Visit (INDEPENDENT_AMBULATORY_CARE_PROVIDER_SITE_OTHER): Payer: BLUE CROSS/BLUE SHIELD | Admitting: Internal Medicine

## 2015-07-11 VITALS — BP 118/76 | HR 60 | Temp 97.5°F | Resp 16 | Ht 62.0 in | Wt 179.6 lb

## 2015-07-11 DIAGNOSIS — K219 Gastro-esophageal reflux disease without esophagitis: Secondary | ICD-10-CM

## 2015-07-11 DIAGNOSIS — Z1212 Encounter for screening for malignant neoplasm of rectum: Secondary | ICD-10-CM

## 2015-07-11 DIAGNOSIS — R7303 Prediabetes: Secondary | ICD-10-CM

## 2015-07-11 DIAGNOSIS — R5383 Other fatigue: Secondary | ICD-10-CM

## 2015-07-11 DIAGNOSIS — N182 Chronic kidney disease, stage 2 (mild): Secondary | ICD-10-CM

## 2015-07-11 DIAGNOSIS — E559 Vitamin D deficiency, unspecified: Secondary | ICD-10-CM

## 2015-07-11 DIAGNOSIS — D519 Vitamin B12 deficiency anemia, unspecified: Secondary | ICD-10-CM

## 2015-07-11 DIAGNOSIS — Z0001 Encounter for general adult medical examination with abnormal findings: Secondary | ICD-10-CM

## 2015-07-11 DIAGNOSIS — I10 Essential (primary) hypertension: Secondary | ICD-10-CM

## 2015-07-11 DIAGNOSIS — Z23 Encounter for immunization: Secondary | ICD-10-CM

## 2015-07-11 DIAGNOSIS — Z79899 Other long term (current) drug therapy: Secondary | ICD-10-CM

## 2015-07-11 DIAGNOSIS — Z111 Encounter for screening for respiratory tuberculosis: Secondary | ICD-10-CM

## 2015-07-11 DIAGNOSIS — Z6834 Body mass index (BMI) 34.0-34.9, adult: Secondary | ICD-10-CM

## 2015-07-11 DIAGNOSIS — E782 Mixed hyperlipidemia: Secondary | ICD-10-CM

## 2015-07-11 LAB — VITAMIN B12: VITAMIN B 12: 1097 pg/mL — AB (ref 211–911)

## 2015-07-11 LAB — LIPID PANEL
Cholesterol: 188 mg/dL (ref 0–200)
HDL: 58 mg/dL (ref >=46)
LDL Cholesterol: 119 mg/dL — ABNORMAL HIGH (ref 0–99)
Total CHOL/HDL Ratio: 3.2 ratio
Triglycerides: 57 mg/dL (ref <150)
VLDL: 11 mg/dL (ref 0–40)

## 2015-07-11 LAB — HEPATIC FUNCTION PANEL
ALT: 22 U/L (ref 0–35)
AST: 21 U/L (ref 0–37)
Albumin: 4.8 g/dL (ref 3.5–5.2)
Alkaline Phosphatase: 59 U/L (ref 39–117)
Bilirubin, Direct: 0.1 mg/dL (ref 0.0–0.3)
Indirect Bilirubin: 0.4 mg/dL (ref 0.2–1.2)
Total Bilirubin: 0.5 mg/dL (ref 0.2–1.2)
Total Protein: 7.4 g/dL (ref 6.0–8.3)

## 2015-07-11 LAB — BASIC METABOLIC PANEL WITH GFR
BUN: 22 mg/dL (ref 6–23)
CHLORIDE: 98 meq/L (ref 96–112)
CO2: 24 mEq/L (ref 19–32)
CREATININE: 0.83 mg/dL (ref 0.50–1.10)
Calcium: 9.9 mg/dL (ref 8.4–10.5)
GFR, EST NON AFRICAN AMERICAN: 77 mL/min
GFR, Est African American: 89 mL/min
Glucose, Bld: 91 mg/dL (ref 70–99)
Potassium: 3.9 mEq/L (ref 3.5–5.3)
Sodium: 138 mEq/L (ref 135–145)

## 2015-07-11 LAB — CBC WITH DIFFERENTIAL/PLATELET
Basophils Absolute: 0.1 10*3/uL (ref 0.0–0.1)
Basophils Relative: 1 % (ref 0–1)
EOS ABS: 0.1 10*3/uL (ref 0.0–0.7)
Eosinophils Relative: 2 % (ref 0–5)
HCT: 36 % (ref 36.0–46.0)
Hemoglobin: 12.5 g/dL (ref 12.0–15.0)
Lymphocytes Relative: 34 % (ref 12–46)
Lymphs Abs: 1.8 10*3/uL (ref 0.7–4.0)
MCH: 30 pg (ref 26.0–34.0)
MCHC: 34.7 g/dL (ref 30.0–36.0)
MCV: 86.5 fL (ref 78.0–100.0)
MONOS PCT: 9 % (ref 3–12)
MPV: 10.2 fL (ref 8.6–12.4)
Monocytes Absolute: 0.5 10*3/uL (ref 0.1–1.0)
Neutro Abs: 2.9 10*3/uL (ref 1.7–7.7)
Neutrophils Relative %: 54 % (ref 43–77)
Platelets: 289 10*3/uL (ref 150–400)
RBC: 4.16 MIL/uL (ref 3.87–5.11)
RDW: 13.9 % (ref 11.5–15.5)
WBC: 5.3 10*3/uL (ref 4.0–10.5)

## 2015-07-11 LAB — HEMOGLOBIN A1C
Hgb A1c MFr Bld: 6.2 % — ABNORMAL HIGH (ref <5.7)
Mean Plasma Glucose: 131 mg/dL — ABNORMAL HIGH (ref <117)

## 2015-07-11 LAB — IRON AND TIBC
%SAT: 16 % — AB (ref 20–55)
Iron: 90 ug/dL (ref 42–145)
TIBC: 557 ug/dL — AB (ref 250–470)
UIBC: 467 ug/dL — AB (ref 125–400)

## 2015-07-11 LAB — MAGNESIUM: Magnesium: 2.2 mg/dL (ref 1.5–2.5)

## 2015-07-11 LAB — TSH: TSH: 1.479 u[IU]/mL (ref 0.350–4.500)

## 2015-07-11 NOTE — Patient Instructions (Addendum)
Recommend Adult Low dose Aspirin or coated  Aspirin 81 mg daily   To reduce risk of Colon Cancer 20 %,   Skin Cancer 26 % ,   Melanoma 46%   and   Pancreatic cancer 60% ++++++++++++++++++ Vitamin D goal is between 70-100.   Please make sure that you are taking your Vitamin D as directed.   It is very important as a natural anti-inflammatory   helping hair, skin, and nails, as well as reducing stroke and heart attack risk.   It helps your bones and helps with mood.  It also decreases numerous cancer risks so please take it as directed.   Low Vit D is associated with a 200-300% higher risk for CANCER   and 200-300% higher risk for HEART   ATTACK  &  STROKE.   ......................................  It is also associated with higher death rate at younger ages,   autoimmune diseases like Rheumatoid arthritis, Lupus, Multiple Sclerosis.     Also many other serious conditions, like depression, Alzheimer's  Dementia, infertility, muscle aches, fatigue, fibromyalgia - just to name a few.  +++++++++++++++++++  Recommend the book "The END of DIETING" by Dr Joel Fuhrman   & the book "The END of DIABETES " by Dr Joel Fuhrman  At [employer] - get book & Audio CD's     Being diabetic has a  300% increased risk for heart attack, stroke, cancer, and alzheimer- type vascular dementia. It is very important that you work harder with diet by avoiding all foods that are white. Avoid white rice (brown & wild rice is OK), white potatoes (sweetpotatoes in moderation is OK), White bread or wheat bread or anything made out of white flour like bagels, donuts, rolls, buns, biscuits, cakes, pastries, cookies, pizza crust, and pasta (made from white flour & egg whites) - vegetarian pasta or spinach or wheat pasta is OK. Multigrain breads like Arnold's or [employer], or multigrain sandwich thins or flatbreads.  Diet, exercise and weight loss can reverse and cure diabetes in the early stages.   Diet, exercise and weight loss is very important in the control and prevention of complications of diabetes which affects every system in your body, ie. Brain - dementia/stroke, eyes - glaucoma/blindness, heart - heart attack/heart failure, kidneys - dialysis, stomach - gastric paralysis, intestines - malabsorption, nerves - severe painful neuritis, circulation - gangrene & loss of a leg(s), and finally cancer and Alzheimers.    I recommend avoid fried & greasy foods,  sweets/candy, white rice (brown or wild rice or Quinoa is OK), white potatoes (sweet potatoes are OK) - anything made from white flour - bagels, doughnuts, rolls, buns, biscuits,white and wheat breads, pizza crust and traditional pasta made of white flour & egg white(vegetarian pasta or spinach or wheat pasta is OK).  Multi-grain bread is OK - like multi-grain flat bread or sandwich thins. Avoid alcohol in excess. Exercise is also important.    Eat all the vegetables you want - avoid meat, especially red meat and dairy - especially cheese.  Cheese is the most concentrated form of trans-fats which is the worst thing to clog up our arteries. Veggie cheese is OK which can be found in the fresh produce section at [employer] or [employer] or [employer]  ++++++++++++++++++++++++++   Preventive Care for Adults  A healthy lifestyle and preventive care can promote health and wellness. Preventive health guidelines for women include the following key practices.  A routine yearly physical is a good way   to check with your health care provider about your health and preventive screening. It is a chance to share any concerns and updates on your health and to receive a thorough exam.  Visit your dentist for a routine exam and preventive care every 6 months. Brush your teeth twice a day and floss once a day. Good oral hygiene prevents tooth decay and gum disease.  The frequency of eye exams is based on your age, health, family medical history, use  of contact lenses, and other factors. Follow your health care provider's recommendations for frequency of eye exams.  Eat a healthy diet. Foods like vegetables, fruits, whole grains, low-fat dairy products, and lean protein foods contain the nutrients you need without too many calories. Decrease your intake of foods high in solid fats, added sugars, and salt. Eat the right amount of calories for you.Get information about a proper diet from your health care provider, if necessary.  Regular physical exercise is one of the most important things you can do for your health. Most adults should get at least 150 minutes of moderate-intensity exercise (any activity that increases your heart rate and causes you to sweat) each week. In addition, most adults need muscle-strengthening exercises on 2 or more days a week.  Maintain a healthy weight. The body mass index (BMI) is a screening tool to identify possible weight problems. It provides an estimate of body fat based on height and weight. Your health care provider can find your BMI and can help you achieve or maintain a healthy weight.For adults 20 years and older:  A BMI below 18.5 is considered underweight.  A BMI of 18.5 to 24.9 is normal.  A BMI of 25 to 29.9 is considered overweight.  A BMI of 30 and above is considered obese.  Maintain normal blood lipids and cholesterol levels by exercising and minimizing your intake of saturated fat. Eat a balanced diet with plenty of fruit and vegetables. Blood tests for lipids and cholesterol should begin at age 49 and be repeated every 5 years. If your lipid or cholesterol levels are high, you are over 50, or you are at high risk for heart disease, you may need your cholesterol levels checked more frequently.Ongoing high lipid and cholesterol levels should be treated with medicines if diet and exercise are not working.  If you smoke, find out from your health care provider how to quit. If you do not use  tobacco, do not start.  Lung cancer screening is recommended for adults aged 6-80 years who are at high risk for developing lung cancer because of a history of smoking. A yearly low-dose CT scan of the lungs is recommended for people who have at least a 30-pack-year history of smoking and are a current smoker or have quit within the past 15 years. A pack year of smoking is smoking an average of 1 pack of cigarettes a day for 1 year (for example: 1 pack a day for 30 years or 2 packs a day for 15 years). Yearly screening should continue until the smoker has stopped smoking for at least 15 years. Yearly screening should be stopped for people who develop a health problem that would prevent them from having lung cancer treatment.  High blood pressure causes heart disease and increases the risk of stroke. Your blood pressure should be checked at least every 1 to 2 years. Ongoing high blood pressure should be treated with medicines if weight loss and exercise do not work.  If you  are 21-72 years old, ask your health care provider if you should take aspirin to prevent strokes.  Diabetes screening involves taking a blood sample to check your fasting blood sugar level. This should be done once every 3 years, after age 16, if you are within normal weight and without risk factors for diabetes. Testing should be considered at a younger age or be carried out more frequently if you are overweight and have at least 1 risk factor for diabetes.  Breast cancer screening is essential preventive care for women. You should practice "breast self-awareness." This means understanding the normal appearance and feel of your breasts and may include breast self-examination. Any changes detected, no matter how small, should be reported to a health care provider. Women in their 4s and 30s should have a clinical breast exam (CBE) by a health care provider as part of a regular health exam every 1 to 3 years. After age 39, women should  have a CBE every year. Starting at age 31, women should consider having a mammogram (breast X-ray test) every year. Women who have a family history of breast cancer should talk to their health care provider about genetic screening. Women at a high risk of breast cancer should talk to their health care providers about having an MRI and a mammogram every year.  Breast cancer gene (BRCA)-related cancer risk assessment is recommended for women who have family members with BRCA-related cancers. BRCA-related cancers include breast, ovarian, tubal, and peritoneal cancers. Having family members with these cancers may be associated with an increased risk for harmful changes (mutations) in the breast cancer genes BRCA1 and BRCA2. Results of the assessment will determine the need for genetic counseling and BRCA1 and BRCA2 testing.  Routine pelvic exams to screen for cancer are no longer recommended for nonpregnant women who are considered low risk for cancer of the pelvic organs (ovaries, uterus, and vagina) and who do not have symptoms. Ask your health care provider if a screening pelvic exam is right for you.  If you have had past treatment for cervical cancer or a condition that could lead to cancer, you need Pap tests and screening for cancer for at least 20 years after your treatment. If Pap tests have been discontinued, your risk factors (such as having a new sexual partner) need to be reassessed to determine if screening should be resumed. Some women have medical problems that increase the chance of getting cervical cancer. In these cases, your health care provider may recommend more frequent screening and Pap tests.  Colorectal cancer can be detected and often prevented. Most routine colorectal cancer screening begins at the age of 85 years and continues through age 75 years. However, your health care provider may recommend screening at an earlier age if you have risk factors for colon cancer. On a yearly  basis, your health care provider may provide home test kits to check for hidden blood in the stool. Use of a small camera at the end of a tube, to directly examine the colon (sigmoidoscopy or colonoscopy), can detect the earliest forms of colorectal cancer. Talk to your health care provider about this at age 83, when routine screening begins. Direct exam of the colon should be repeated every 5-10 years through age 31 years, unless early forms of pre-cancerous polyps or small growths are found.  Hepatitis C blood testing is recommended for all people born from [birth year] through 1965 and any individual with known risks for hepatitis C.  Pra  Osteoporosis  is a disease in which the bones lose minerals and strength with aging. This can result in serious bone fractures or breaks. The risk of osteoporosis can be identified using a bone density scan. Women ages 67 years and over and women at risk for fractures or osteoporosis should discuss screening with their health care providers. Ask your health care provider whether you should take a calcium supplement or vitamin D to reduce the rate of osteoporosis.  Menopause can be associated with physical symptoms and risks. Hormone replacement therapy is available to decrease symptoms and risks. You should talk to your health care provider about whether hormone replacement therapy is right for you.  Use sunscreen. Apply sunscreen liberally and repeatedly throughout the day. You should seek shade when your shadow is shorter than you. Protect yourself by wearing long sleeves, pants, a wide-brimmed hat, and sunglasses year round, whenever you are outdoors.  Once a month, do a whole body skin exam, using a mirror to look at the skin on your back. Tell your health care provider of new moles, moles that have irregular borders, moles that are larger than a pencil eraser, or moles that have changed in shape or color.  Stay current with required vaccines  (immunizations).  Influenza vaccine. All adults should be immunized every year.  Tetanus, diphtheria, and acellular pertussis (Td, Tdap) vaccine. Pregnant women should receive 1 dose of Tdap vaccine during each pregnancy. The dose should be obtained regardless of the length of time since the last dose. Immunization is preferred during the 27th-36th week of gestation. An adult who has not previously received Tdap or who does not know her vaccine status should receive 1 dose of Tdap. This initial dose should be followed by tetanus and diphtheria toxoids (Td) booster doses every 10 years. Adults with an unknown or incomplete history of completing a 3-dose immunization series with Td-containing vaccines should begin or complete a primary immunization series including a Tdap dose. Adults should receive a Td booster every 10 years.  Varicella vaccine. An adult without evidence of immunity to varicella should receive 2 doses or a second dose if she has previously received 1 dose. Pregnant females who do not have evidence of immunity should receive the first dose after pregnancy. This first dose should be obtained before leaving the health care facility. The second dose should be obtained 4-8 weeks after the first dose.  Human papillomavirus (HPV) vaccine. Females aged 13-26 years who have not received the vaccine previously should obtain the 3-dose series. The vaccine is not recommended for use in pregnant females. However, pregnancy testing is not needed before receiving a dose. If a female is found to be pregnant after receiving a dose, no treatment is needed. In that case, the remaining doses should be delayed until after the pregnancy. Immunization is recommended for any person with an immunocompromised condition through the age of 11 years if she did not get any or all doses earlier. During the 3-dose series, the second dose should be obtained 4-8 weeks after the first dose. The third dose should be obtained  24 weeks after the first dose and 16 weeks after the second dose.  Zoster vaccine. One dose is recommended for adults aged 58 years or older unless certain conditions are present.  Measles, mumps, and rubella (MMR) vaccine. Adults born before [birth year] generally are considered immune to measles and mumps. Adults born in [birth year] or later should have 1 or more doses of MMR vaccine unless there is a contraindication  to the vaccine or there is laboratory evidence of immunity to each of the three diseases. A routine second dose of MMR vaccine should be obtained at least 28 days after the first dose for students attending postsecondary schools, health care workers, or international travelers. People who received inactivated measles vaccine or an unknown type of measles vaccine during 1963-1967 should receive 2 doses of MMR vaccine. People who received inactivated mumps vaccine or an unknown type of mumps vaccine before 1979 and are at high risk for mumps infection should consider immunization with 2 doses of MMR vaccine. For females of childbearing age, rubella immunity should be determined. If there is no evidence of immunity, females who are not pregnant should be vaccinated. If there is no evidence of immunity, females who are pregnant should delay immunization until after pregnancy. Unvaccinated health care workers born before [birth year] who lack laboratory evidence of measles, mumps, or rubella immunity or laboratory confirmation of disease should consider measles and mumps immunization with 2 doses of MMR vaccine or rubella immunization with 1 dose of MMR vaccine.  Pneumococcal 13-valent conjugate (PCV13) vaccine. When indicated, a person who is uncertain of her immunization history and has no record of immunization should receive the PCV13 vaccine. An adult aged 26 years or older who has certain medical conditions and has not been previously immunized should receive 1 dose of PCV13 vaccine. This PCV13 should be followed  with a dose of pneumococcal polysaccharide (PPSV23) vaccine. The PPSV23 vaccine dose should be obtained at least 8 weeks after the dose of PCV13 vaccine. An adult aged 78 years or older who has certain medical conditions and previously received 1 or more doses of PPSV23 vaccine should receive 1 dose of PCV13. The PCV13 vaccine dose should be obtained 1 or more years after the last PPSV23 vaccine dose.    Pneumococcal polysaccharide (PPSV23) vaccine. When PCV13 is also indicated, PCV13 should be obtained first. All adults aged 9 years and older should be immunized. An adult younger than age 13 years who has certain medical conditions should be immunized. Any person who resides in a nursing home or long-term care facility should be immunized. An adult smoker should be immunized. People with an immunocompromised condition and certain other conditions should receive both PCV13 and PPSV23 vaccines. People with human immunodeficiency virus (HIV) infection should be immunized as soon as possible after diagnosis. Immunization during chemotherapy or radiation therapy should be avoided. Routine use of PPSV23 vaccine is not recommended for American Indians, [place] Natives, or people younger than 65 years unless there are medical conditions that require PPSV23 vaccine. When indicated, people who have unknown immunization and have no record of immunization should receive PPSV23 vaccine. One-time revaccination 5 years after the first dose of PPSV23 is recommended for people aged 19-64 years who have chronic kidney failure, nephrotic syndrome, asplenia, or immunocompromised conditions. People who received 1-2 doses of PPSV23 before age 64 years should receive another dose of PPSV23 vaccine at age 53 years or later if at least 5 years have passed since the previous dose. Doses of PPSV23 are not needed for people immunized with PPSV23 at or after age 23 years.  Preventive Services / Frequency   Ages 73 to 11 years  Blood  pressure check.  Lipid and cholesterol check.  Lung cancer screening. / Every year if you are aged 42-80 years and have a 30-pack-year history of smoking and currently smoke or have quit within the past 15 years. Yearly screening is stopped once  you have quit smoking for at least 15 years or develop a health problem that would prevent you from having lung cancer treatment.  Clinical breast exam.** / Every year after age 58 years.  BRCA-related cancer risk assessment.** / For women who have family members with a BRCA-related cancer (breast, ovarian, tubal, or peritoneal cancers).  Mammogram.** / Every year beginning at age 59 years and continuing for as long as you are in good health. Consult with your health care provider.  Pap test.** / Every 3 years starting at age 36 years through age 25 or 70 years with a history of 3 consecutive normal Pap tests.  HPV screening.** / Every 3 years from ages 49 years through ages 57 to 89 years with a history of 3 consecutive normal Pap tests.  Fecal occult blood test (FOBT) of stool. / Every year beginning at age 63 years and continuing until age 63 years. You may not need to do this test if you get a colonoscopy every 10 years.  Flexible sigmoidoscopy or colonoscopy.** / Every 5 years for a flexible sigmoidoscopy or every 10 years for a colonoscopy beginning at age 56 years and continuing until age 18 years.  Hepatitis C blood test.** / For all people born from [birth year] through 1966 and any individual with known risks for hepatitis C.  Skin self-exam. / Monthly.  Influenza vaccine. / Every year.  Tetanus, diphtheria, and acellular pertussis (Tdap/Td) vaccine.** / Consult your health care provider. Pregnant women should receive 1 dose of Tdap vaccine during each pregnancy. 1 dose of Td every 10 years.  Varicella vaccine.** / Consult your health care provider. Pregnant females who do not have evidence of immunity should receive the first dose after  pregnancy.  Zoster vaccine.** / 1 dose for adults aged 74 years or older.  Pneumococcal 13-valent conjugate (PCV13) vaccine.** / Consult your health care provider.  Pneumococcal polysaccharide (PPSV23) vaccine.** / 1 to 2 doses if you smoke cigarettes or if you have certain conditions.  Meningococcal vaccine.** / Consult your health care provider.  Hepatitis A vaccine.** / Consult your health care provider.  Hepatitis B vaccine.** / Consult your health care provider. Screening for abdominal aortic aneurysm (AAA)  by ultrasound is recommended for people over 50 who have history of high blood pressure or who are current or former smokers.

## 2015-07-11 NOTE — Progress Notes (Signed)
Patient ID: Jenna Miller, female   DOB: [birth year], 59 y.o.   MRN: [id number]  Annual Comprehensive Examination  This very nice 59 y.o.female presents for complete physical.  Patient has been followed for HTN, Morbid Obesity, Prediabetes, Hyperlipidemia, and Vitamin D Deficiency. Other pertinent hx is that of Left Breast Cancer at age 88 yo in 75.    HTN predates since 2009. Patient's BP has been controlled at home and patient denies any cardiac symptoms as chest pain, palpitations, shortness of breath, dizziness or ankle swelling. Today's BP: 118/76 mmHg    Patient's hyperlipidemia is controlled with diet and medications. Patient is statin intolerant and denies myalgias or other medication SE's. Last lipids were near goal -  Chol 169; HDL 57; LDL 103; Trig 47 on 02/16/2015.   Patient has Morbid Obesity (BMI 32.5) and consequent prediabetes predating since 2013 with A1c 5.9% & 6.1%  and patient denies reactive hypoglycemic symptoms, visual blurring, diabetic polys, or paresthesias. Last A1c was 6.2% on 02/16/2015.     Finally, patient has history of Vitamin D Deficiency of 39 in 2008 and last Vitamin D was  58 on 02/16/2015.     Medication Sig  . ALPRAZolam  1 MG tablet TAKE 1/2 TO 1 TABLET BY MOUTH 3 TIMES A DAY AS NEEDED    . Biotin 5 MG CAPS Take by mouth.  Marland Kitchen CALCIUM 1000 + D  Take by mouth daily.  Marland Kitchen VITAMIN D 2000 UNITS  Take 5,000 mg by mouth.  . ezetimibe 10 MG tablet Take 1 tablet (10 mg total) by mouth daily.  Marland Kitchen gemfibrozil  600 MG tablet TAKE 1 TABLET TWICE A DAY WITH FOOD FOR CHOLESTEROL  . hctz 25 MG tablet Take 1 tablet daily for BP & Fluid  . magnesium oxide  400 (241.3 MG)  Take 400 mg by mouth daily.  . pantoprazole  40 MG tablet TAKE 1 TABLET (40 MG TOTAL) BY MOUTH 2 (TWO) TIMES DAILY.  Marland Kitchen Pyridoxine (VIT B-6) 250 MG  Take 250 mg by mouth daily.  . ranitidine  300 MG tablet Take 1 to 2 tablets daily as needed for Acid Indigestion & Reflux  . sucralfate  1 G tablet Take 1  tablet 4 x day before meals & at bedtime  . vitamin B-12  100 MCG  Take 100 mcg by mouth.  . Wheat Dextrin (BENEFIBER  MIX ) Take 1 packet by mouth.   Allergies  Allergen Reactions  . Lipitor [Atorvastatin] Anaphylaxis  . Effexor [Venlafaxine]   . Paxil [Paroxetine Hcl]   . Wellbutrin [Bupropion]     Hives   Past Medical History  Diagnosis Date  . Complication of anesthesia     woke  up X 1  . Vitamin D deficiency   . Hypertension   . High cholesterol   . Acid reflux   . Cancer     Left Breast  . Arthritis   . Fatty liver    Health Maintenance  Topic Date Due  . ZOSTAVAX  03/07/2015  . TETANUS/TDAP  05/29/2015  . INFLUENZA VACCINE  07/30/2015  . MAMMOGRAM  08/08/2016  . PAP SMEAR  07/31/2017  . COLONOSCOPY  04/28/2021  . HIV Screening  Completed   Immunization History  Administered Date(s) Administered  . Influenza Split 08/29/2013  . Influenza, Seasonal, Injecte, Preservative Fre 11/02/2014  . PPD Test 03/14/2014, 07/11/2015  . Pneumococcal Polysaccharide-23 05/22/2003  . Td 05/28/2005  . Tdap 07/11/2015   Past Surgical History  Procedure  Laterality Date  . Tram  1999-2000  . Knee arthroscopy  1982  . Wisdom tooth extraction    . Cholecystectomy  11/19/2012    Procedure: LAPAROSCOPIC CHOLECYSTECTOMY WITH INTRAOPERATIVE CHOLANGIOGRAM;  Surgeon: Zenovia Jarred, MD;  Location: [place];  Service: General;  Laterality: N/A;  . Breast surgery  1995    mastectomy left  . Hernia repair     Family History  Problem Relation Age of Onset  . Cancer Cousin     lung  . Hypertension Mother   . Hyperlipidemia Mother   . Hyperlipidemia Brother   . Hypertension Brother    History  Substance Use Topics  . Smoking status: Never Smoker   . Smokeless tobacco: Not on file  . Alcohol Use: Yes     Comment: rare    ROS Constitutional: Denies fever, chills, weight loss/gain, headaches, insomnia,  night sweats, and change in appetite. Does c/o fatigue. Eyes: Denies  redness, blurred vision, diplopia, discharge, itchy, watery eyes.  ENT: Denies discharge, congestion, post nasal drip, epistaxis, sore throat, earache, hearing loss, dental pain, Tinnitus, Vertigo, Sinus pain, snoring.  Cardio: Denies chest pain, palpitations, irregular heartbeat, syncope, dyspnea, diaphoresis, orthopnea, PND, claudication, edema Respiratory: denies cough, dyspnea, DOE, pleurisy, hoarseness, laryngitis, wheezing.  Gastrointestinal: Denies dysphagia, heartburn, reflux, water brash, pain, cramps, nausea, vomiting, bloating, diarrhea, constipation, hematemesis, melena, hematochezia, jaundice, hemorrhoids Genitourinary: Denies dysuria, frequency, urgency, nocturia, hesitancy, discharge, hematuria, flank pain Breast: Breast lumps, nipple discharge, bleeding.  Musculoskeletal: Denies arthralgia, myalgia, stiffness, Jt. Swelling, pain, limp, and strain/sprain. Denies falls. Skin: Denies puritis, rash, hives, warts, acne, eczema, changing in skin lesion Neuro: No weakness, tremor, incoordination, spasms, paresthesia, pain Psychiatric: Denies confusion, memory loss, sensory loss. Denies Depression. Endocrine: Denies change in weight, skin, hair change, nocturia, and paresthesia, diabetic polys, visual blurring, hyper / hypo glycemic episodes.  Heme/Lymph: No excessive bleeding, bruising, enlarged lymph nodes.  Physical Exam  BP 118/76   Pulse 60  Temp 97.5 F   Resp 16  Ht 5\' 2"    Wt 179 lb 9.6 oz     BMI 32.84    LMP 2011  General Appearance: Well nourished and in no apparent distress. Eyes: PERRLA, EOMs, conjunctiva no swelling or erythema, normal fundi and vessels. Sinuses: No frontal/maxillary tenderness ENT/Mouth: EACs patent / TMs  nl. Nares clear without erythema, swelling, mucoid exudates. Oral hygiene is good. No erythema, swelling, or exudate. Tongue normal, non-obstructing. Tonsils not swollen or erythematous. Hearing normal.  Neck: Supple, thyroid normal. No bruits,  nodes or JVD. Respiratory: Respiratory effort normal.  BS equal and clear bilateral without rales, rhonci, wheezing or stridor. Cardio: Heart sounds are normal with regular rate and rhythm and no murmurs, rubs or gallops. Peripheral pulses are normal and equal bilaterally without edema. No aortic or femoral bruits. Chest: symmetric with normal excursions and percussion. Breasts: Symmetric, without lumps, nipple discharge, retractions, or fibrocystic changes.  Abdomen: Flat, soft, with bowel sounds. Nontender, no guarding, rebound, hernias, masses, or organomegaly.  Lymphatics: Non tender without lymphadenopathy.  Musculoskeletal: Full ROM all peripheral extremities, joint stability, 5/5 strength, and normal gait. Skin: Warm and dry without rashes, lesions, cyanosis, clubbing or  ecchymosis.  Neuro: Cranial nerves intact, reflexes equal bilaterally. Normal muscle tone, no cerebellar symptoms. Sensation intact.  Pysch: Awake and oriented X 3, normal affect, Insight and Judgment appropriate.   Assessment and Plan  1. Essential hypertension  - TSH - Microalbumin / creatinine urine ratio - EKG 12-Lead - [place], RETROPERITNL ABD,  LTD  2. Hyperlipidemia  - Lipid panel  3. Prediabetes  - Hemoglobin A1c - Insulin, random  4. Vitamin D deficiency  - Vit D  25 hydroxy   5. Morbid Obesity (BMI 34.13)   6. CKD Stage 2 (GFR 69 ml/min)   7. Gastroesophageal reflux disease   8. BMI 34.0-34.9,adult   9. Screening for rectal cancer  - POC Hemoccult Bld/Stl   10. Vitamin B12 deficiency anemia  - Methylmalonic acid, serum  11. Screening examination for pulmonary tuberculosis  - PPD  12. Need for prophylactic vaccination with combined diphtheria-tetanus-pertussis (DTP) vaccine  - Tdap vaccine greater than or equal to 7yo IM  13. Other fatigue  - Vitamin B12 - Iron and TIBC  14. Medication management  - CBC with Differential/Platelet - BASIC METABOLIC PANEL WITH GFR -  Hepatic function panel - Magnesium - Urine Microscopic   Continue prudent diet as discussed, weight control, BP monitoring, regular exercise, and medications. Discussed med's effects and SE's. Screening labs and tests as requested with regular follow-up as recommended.  Over 40 minutes of exam, counseling, chart review was performed.

## 2015-07-12 LAB — URINALYSIS, MICROSCOPIC ONLY
BACTERIA UA: NONE SEEN
CASTS: NONE SEEN
Crystals: NONE SEEN
Squamous Epithelial / LPF: NONE SEEN

## 2015-07-12 LAB — MICROALBUMIN / CREATININE URINE RATIO: CREATININE, URINE: 17.3 mg/dL

## 2015-07-12 LAB — VITAMIN D 25 HYDROXY (VIT D DEFICIENCY, FRACTURES): Vit D, 25-Hydroxy: 63 ng/mL (ref 30–100)

## 2015-07-12 LAB — INSULIN, RANDOM: Insulin: 12.3 u[IU]/mL (ref 2.0–19.6)

## 2015-07-13 LAB — METHYLMALONIC ACID, SERUM: METHYLMALONIC ACID, QUANT: 139 nmol/L (ref 87–318)

## 2015-07-30 ENCOUNTER — Other Ambulatory Visit: Payer: Self-pay | Admitting: Obstetrics and Gynecology

## 2015-07-30 DIAGNOSIS — Z1231 Encounter for screening mammogram for malignant neoplasm of breast: Secondary | ICD-10-CM

## 2015-08-08 ENCOUNTER — Encounter: Payer: Self-pay | Admitting: Internal Medicine

## 2015-09-05 ENCOUNTER — Ambulatory Visit
Admission: RE | Admit: 2015-09-05 | Discharge: 2015-09-05 | Disposition: A | Discharge status: Home / Self Care | Payer: BLUE CROSS/BLUE SHIELD | Source: Ambulatory Visit | Attending: Obstetrics and Gynecology | Admitting: Obstetrics and Gynecology

## 2015-09-05 DIAGNOSIS — Z1231 Encounter for screening mammogram for malignant neoplasm of breast: Secondary | ICD-10-CM

## 2015-09-11 ENCOUNTER — Other Ambulatory Visit: Payer: Self-pay | Admitting: Internal Medicine

## 2015-09-11 DIAGNOSIS — F411 Generalized anxiety disorder: Secondary | ICD-10-CM

## 2015-09-11 MED ORDER — ALPRAZOLAM 1 MG PO TABS: ORAL_TABLET | ORAL | Status: DC

## 2015-09-11 NOTE — Progress Notes (Signed)
Rx called in @ [facility]

## 2015-09-11 NOTE — Addendum Note (Signed)
Addended by: Vicie Mutters R on: 09/11/2015 08:45 AM   Modules accepted: Orders

## 2015-09-16 ENCOUNTER — Other Ambulatory Visit: Payer: Self-pay | Admitting: Internal Medicine

## 2015-09-24 ENCOUNTER — Other Ambulatory Visit: Payer: Self-pay | Admitting: Obstetrics and Gynecology

## 2015-09-25 LAB — CYTOLOGY - PAP

## 2015-10-16 ENCOUNTER — Ambulatory Visit (INDEPENDENT_AMBULATORY_CARE_PROVIDER_SITE_OTHER): Payer: BLUE CROSS/BLUE SHIELD | Admitting: Internal Medicine

## 2015-10-16 ENCOUNTER — Encounter: Payer: Self-pay | Admitting: Internal Medicine

## 2015-10-16 DIAGNOSIS — Z23 Encounter for immunization: Secondary | ICD-10-CM

## 2015-10-16 DIAGNOSIS — R7303 Prediabetes: Secondary | ICD-10-CM

## 2015-10-16 DIAGNOSIS — E782 Mixed hyperlipidemia: Secondary | ICD-10-CM

## 2015-10-16 DIAGNOSIS — I10 Essential (primary) hypertension: Secondary | ICD-10-CM

## 2015-10-16 DIAGNOSIS — Z79899 Other long term (current) drug therapy: Secondary | ICD-10-CM

## 2015-10-16 DIAGNOSIS — N182 Chronic kidney disease, stage 2 (mild): Secondary | ICD-10-CM

## 2015-10-16 DIAGNOSIS — E559 Vitamin D deficiency, unspecified: Secondary | ICD-10-CM | POA: Diagnosis not present

## 2015-10-16 LAB — CBC WITH DIFFERENTIAL/PLATELET
BASOS ABS: 0 10*3/uL (ref 0.0–0.1)
BASOS PCT: 0 % (ref 0–1)
EOS ABS: 0.1 10*3/uL (ref 0.0–0.7)
Eosinophils Relative: 2 % (ref 0–5)
HCT: 35.3 % — ABNORMAL LOW (ref 36.0–46.0)
HEMOGLOBIN: 12.3 g/dL (ref 12.0–15.0)
Lymphocytes Relative: 33 % (ref 12–46)
Lymphs Abs: 1.6 10*3/uL (ref 0.7–4.0)
MCH: 29.7 pg (ref 26.0–34.0)
MCHC: 34.8 g/dL (ref 30.0–36.0)
MCV: 85.3 fL (ref 78.0–100.0)
MPV: 10.3 fL (ref 8.6–12.4)
Monocytes Absolute: 0.4 10*3/uL (ref 0.1–1.0)
Monocytes Relative: 9 % (ref 3–12)
NEUTROS ABS: 2.7 10*3/uL (ref 1.7–7.7)
NEUTROS PCT: 56 % (ref 43–77)
PLATELETS: 271 10*3/uL (ref 150–400)
RBC: 4.14 MIL/uL (ref 3.87–5.11)
RDW: 13.8 % (ref 11.5–15.5)
WBC: 4.8 10*3/uL (ref 4.0–10.5)

## 2015-10-16 LAB — BASIC METABOLIC PANEL WITH GFR
BUN: 20 mg/dL (ref 7–25)
CHLORIDE: 101 mmol/L (ref 98–110)
CO2: 24 mmol/L (ref 20–31)
CREATININE: 0.81 mg/dL (ref 0.50–0.99)
Calcium: 10.1 mg/dL (ref 8.6–10.4)
GFR, EST NON AFRICAN AMERICAN: 79 mL/min (ref >=60)
GFR, Est African American: >89 mL/min (ref >=60)
Glucose, Bld: 96 mg/dL (ref 65–99)
Potassium: 3.9 mmol/L (ref 3.5–5.3)
SODIUM: 137 mmol/L (ref 135–146)

## 2015-10-16 LAB — HEPATIC FUNCTION PANEL
ALT: 23 U/L (ref 6–29)
AST: 21 U/L (ref 10–35)
Albumin: 4.7 g/dL (ref 3.6–5.1)
Alkaline Phosphatase: 55 U/L (ref 33–130)
BILIRUBIN DIRECT: 0.1 mg/dL (ref <=0.2)
BILIRUBIN INDIRECT: 0.4 mg/dL (ref 0.2–1.2)
BILIRUBIN TOTAL: 0.5 mg/dL (ref 0.2–1.2)
Total Protein: 6.9 g/dL (ref 6.1–8.1)

## 2015-10-16 LAB — LIPID PANEL
CHOL/HDL RATIO: 3.5 ratio (ref <=5.0)
CHOLESTEROL: 179 mg/dL (ref 125–200)
HDL: 51 mg/dL (ref >=46)
LDL CALC: 115 mg/dL (ref <130)
TRIGLYCERIDES: 63 mg/dL (ref <150)
VLDL: 13 mg/dL (ref <30)

## 2015-10-16 LAB — HEMOGLOBIN A1C
Hgb A1c MFr Bld: 6 % — ABNORMAL HIGH (ref <5.7)
Mean Plasma Glucose: 126 mg/dL — ABNORMAL HIGH (ref <117)

## 2015-10-16 LAB — TSH: TSH: 1.887 u[IU]/mL (ref 0.350–4.500)

## 2015-10-16 NOTE — Progress Notes (Signed)
Patient ID: Jenna Miller, female   DOB: December 04, 1956, 59 y.o.   MRN: [id number]  Assessment and Plan:  Hypertension:  -Continue medication,  -monitor blood pressure at home.  -Continue DASH diet.   -Reminder to go to the ER if any CP, SOB, nausea, dizziness, severe HA, changes vision/speech, left arm numbness and tingling, and jaw pain.  Cholesterol: -Continue diet and exercise.  -Check cholesterol.   Pre-diabetes: -Continue diet and exercise.  -Check A1C  Vitamin D Def: -check level -continue medications.   Continue diet and meds as discussed. Further disposition pending results of labs.  HPI 59 y.o. female  presents for 3 month follow up with hypertension, hyperlipidemia, prediabetes and vitamin D.   Her blood pressure has been controlled at home, today their BP is BP: 120/74 mmHg.   She does workout. She denies chest pain, shortness of breath, dizziness.   She is on cholesterol medication and denies myalgias. Her cholesterol is at goal. The cholesterol last visit was:   Lab Results  Component Value Date   CHOL 188 07/11/2015   HDL 58 07/11/2015   LDLCALC 119* 07/11/2015   TRIG 57 07/11/2015   CHOLHDL 3.2 07/11/2015     She has been working on diet and exercise for prediabetes, and denies foot ulcerations, hyperglycemia, hypoglycemia , increased appetite, nausea, paresthesia of the feet, polydipsia, polyuria, visual disturbances, vomiting and weight loss. Last A1C in the office was:  Lab Results  Component Value Date   HGBA1C 6.2* 07/11/2015    Patient is on Vitamin D supplement.  Lab Results  Component Value Date   VD25OH 63 07/11/2015     She does have a spot on her left heel.  She reports that she was told by her Obgyn to go to the dermatologist.    She reports that she is also having trigger finger surgery on both hands.   Current Medications:  Current Outpatient Prescriptions on File Prior to Visit  Medication Sig Dispense Refill  . ALPRAZolam (XANAX) 1  MG tablet Take 1/2 to 1 tablet 3 x day if needed for Anxiety or Sleep 90 tablet 2  . Biotin 5 MG CAPS Take by mouth.    . Calcium Carb-Cholecalciferol (CALCIUM 1000 + D PO) Take by mouth daily.    . Cholecalciferol (VITAMIN D3) 2000 UNITS capsule Take 5,000 mg by mouth.    Marland Kitchen gemfibrozil (LOPID) 600 MG tablet TAKE 1 TABLET TWICE A DAY WITH FOOD FOR CHOLESTEROL 180 tablet 1  . hydrochlorothiazide (HYDRODIURIL) 25 MG tablet Take 1 tablet daily for BP & Fluid 90 tablet 3  . magnesium oxide (MAG-OX) 400 (241.3 MG) MG tablet Take 400 mg by mouth daily.    . Pyridoxine HCl (VITAMIN B-6) 250 MG tablet Take 250 mg by mouth daily.    . ranitidine (ZANTAC) 300 MG tablet Take 1 to 2 tablets daily as needed for Acid Indigestion & Reflux 90 tablet PRN  . vitamin B-12 (CYANOCOBALAMIN) 100 MCG tablet Take 100 mcg by mouth.    . Wheat Dextrin (BENEFIBER DRINK MIX PO) Take 1 packet by mouth.    Marland Kitchen ZETIA 10 MG tablet TAKE 1 TABLET BY MOUTH EVERY DAY FOR CHOLESTEROL 90 tablet 1   No current facility-administered medications on file prior to visit.    Medical History:  Past Medical History  Diagnosis Date  . Complication of anesthesia     woke  up X 1  . Vitamin D deficiency   . Hypertension   .  High cholesterol   . Acid reflux   . Cancer ([place])     Left Breast  . Arthritis   . Fatty liver     Allergies:  Allergies  Allergen Reactions  . Lipitor [Atorvastatin] Anaphylaxis  . Effexor [Venlafaxine]   . Paxil [Paroxetine Hcl]   . Wellbutrin [Bupropion]     Hives     Review of Systems:  Review of Systems  Constitutional: Negative for fever, chills and malaise/fatigue.  HENT: Negative for congestion, ear pain and sore throat.   Respiratory: Negative for cough, shortness of breath and wheezing.   Cardiovascular: Negative for chest pain, palpitations and leg swelling.  Gastrointestinal: Negative for heartburn, nausea, diarrhea, constipation, blood in stool and melena.  Genitourinary: Negative.    Neurological: Negative for dizziness, sensory change, loss of consciousness and headaches.  Psychiatric/Behavioral: Negative for depression. The patient is not nervous/anxious and does not have insomnia.     Family history- Review and unchanged  Social history- Review and unchanged  Physical Exam: BP 120/74 mmHg  Pulse 68  Temp(Src) 98.2 F (36.8 C) (Temporal)  Resp 16  Ht 5\' 2"  (1.575 m)  Wt 182 lb (82.555 kg)  BMI 33.28 kg/m2  LMP 06/10/2010 Wt Readings from Last 3 Encounters:  10/16/15 182 lb (82.555 kg)  07/11/15 179 lb 9.6 oz (81.466 kg)  02/16/15 185 lb (83.915 kg)    General Appearance: Well nourished well developed, in no apparent distress. Eyes: PERRLA, EOMs, conjunctiva no swelling or erythema ENT/Mouth: Ear canals normal without obstruction, swelling, erythma, discharge.  TMs normal bilaterally.  Oropharynx moist, clear, without exudate, or postoropharyngeal swelling. Neck: Supple, thyroid normal,no cervical adenopathy  Respiratory: Respiratory effort normal, Breath sounds clear A&P without rhonchi, wheeze, or rale.  No retractions, no accessory usage. Cardio: RRR with no MRGs. Brisk peripheral pulses without edema.  Abdomen: Soft, + BS,  Non tender, no guarding, rebound, hernias, masses. Musculoskeletal: Full ROM, 5/5 strength, Normal gait Skin: Warm, dry without rashes, lesions, ecchymosis.  Neuro: Awake and oriented X 3, Cranial nerves intact. Normal muscle tone, no cerebellar symptoms. Psych: Normal affect, Insight and Judgment appropriate.    Starlyn Skeans, PA-C 9:02 AM [facility]

## 2015-11-26 DIAGNOSIS — M6530 Trigger finger, unspecified finger: Secondary | ICD-10-CM | POA: Insufficient documentation

## 2015-12-27 ENCOUNTER — Telehealth: Payer: Self-pay | Admitting: *Deleted

## 2015-12-27 MED ORDER — AZITHROMYCIN 250 MG PO TABS: ORAL_TABLET | ORAL | Status: AC

## 2015-12-27 MED ORDER — PREDNISONE 20 MG PO TABS: ORAL_TABLET | ORAL | Status: DC

## 2015-12-27 MED ORDER — PROMETHAZINE-DM 6.25-15 MG/5ML PO SYRP
ORAL_SOLUTION | ORAL | Status: DC
Start: 2015-12-27 — End: 2016-02-26

## 2015-12-27 NOTE — Telephone Encounter (Signed)
Patient called and she is out of town.  She complained of ear pressure, sinus congestion and a cough.  Per Dr Melford Aase, it is OK to send in RX for Z-pak, prednisone and Phenergan cough syrup.

## 2016-01-22 ENCOUNTER — Ambulatory Visit: Payer: Self-pay | Admitting: Internal Medicine

## 2016-02-24 ENCOUNTER — Other Ambulatory Visit: Payer: Self-pay | Admitting: Internal Medicine

## 2016-02-26 ENCOUNTER — Encounter: Payer: Self-pay | Admitting: Internal Medicine

## 2016-02-26 ENCOUNTER — Ambulatory Visit (INDEPENDENT_AMBULATORY_CARE_PROVIDER_SITE_OTHER): Payer: BLUE CROSS/BLUE SHIELD | Admitting: Internal Medicine

## 2016-02-26 VITALS — BP 112/72 | HR 72 | Temp 97.3°F | Resp 16 | Ht 62.0 in | Wt 183.8 lb

## 2016-02-26 DIAGNOSIS — K219 Gastro-esophageal reflux disease without esophagitis: Secondary | ICD-10-CM

## 2016-02-26 DIAGNOSIS — E559 Vitamin D deficiency, unspecified: Secondary | ICD-10-CM

## 2016-02-26 DIAGNOSIS — R7303 Prediabetes: Secondary | ICD-10-CM | POA: Diagnosis not present

## 2016-02-26 DIAGNOSIS — Z79899 Other long term (current) drug therapy: Secondary | ICD-10-CM

## 2016-02-26 DIAGNOSIS — Z23 Encounter for immunization: Secondary | ICD-10-CM | POA: Diagnosis not present

## 2016-02-26 DIAGNOSIS — E782 Mixed hyperlipidemia: Secondary | ICD-10-CM

## 2016-02-26 DIAGNOSIS — I10 Essential (primary) hypertension: Secondary | ICD-10-CM | POA: Diagnosis not present

## 2016-02-26 LAB — LIPID PANEL
CHOL/HDL RATIO: 3.2 ratio (ref <=5.0)
CHOLESTEROL: 171 mg/dL (ref 125–200)
HDL: 53 mg/dL (ref >=46)
LDL Cholesterol: 107 mg/dL (ref <130)
Triglycerides: 56 mg/dL (ref <150)
VLDL: 11 mg/dL (ref <30)

## 2016-02-26 LAB — BASIC METABOLIC PANEL WITH GFR
BUN: 18 mg/dL (ref 7–25)
CALCIUM: 10 mg/dL (ref 8.6–10.4)
CHLORIDE: 100 mmol/L (ref 98–110)
CO2: 28 mmol/L (ref 20–31)
Creat: 0.87 mg/dL (ref 0.50–0.99)
GFR, EST NON AFRICAN AMERICAN: 73 mL/min (ref >=60)
GFR, Est African American: 84 mL/min (ref >=60)
Glucose, Bld: 102 mg/dL — ABNORMAL HIGH (ref 65–99)
Potassium: 4.2 mmol/L (ref 3.5–5.3)
SODIUM: 139 mmol/L (ref 135–146)

## 2016-02-26 LAB — CBC WITH DIFFERENTIAL/PLATELET
Basophils Absolute: 0 10*3/uL (ref 0.0–0.1)
Basophils Relative: 1 % (ref 0–1)
EOS PCT: 2 % (ref 0–5)
Eosinophils Absolute: 0.1 10*3/uL (ref 0.0–0.7)
HEMATOCRIT: 36.2 % (ref 36.0–46.0)
HEMOGLOBIN: 12.5 g/dL (ref 12.0–15.0)
LYMPHS PCT: 37 % (ref 12–46)
Lymphs Abs: 1.6 10*3/uL (ref 0.7–4.0)
MCH: 29.8 pg (ref 26.0–34.0)
MCHC: 34.5 g/dL (ref 30.0–36.0)
MCV: 86.4 fL (ref 78.0–100.0)
MPV: 10.2 fL (ref 8.6–12.4)
Monocytes Absolute: 0.3 10*3/uL (ref 0.1–1.0)
Monocytes Relative: 7 % (ref 3–12)
NEUTROS ABS: 2.3 10*3/uL (ref 1.7–7.7)
NEUTROS PCT: 53 % (ref 43–77)
Platelets: 289 10*3/uL (ref 150–400)
RBC: 4.19 MIL/uL (ref 3.87–5.11)
RDW: 13.4 % (ref 11.5–15.5)
WBC: 4.3 10*3/uL (ref 4.0–10.5)

## 2016-02-26 LAB — HEPATIC FUNCTION PANEL
ALBUMIN: 4.7 g/dL (ref 3.6–5.1)
ALT: 22 U/L (ref 6–29)
AST: 19 U/L (ref 10–35)
Alkaline Phosphatase: 56 U/L (ref 33–130)
BILIRUBIN DIRECT: 0.1 mg/dL (ref <=0.2)
BILIRUBIN TOTAL: 0.5 mg/dL (ref 0.2–1.2)
Indirect Bilirubin: 0.4 mg/dL (ref 0.2–1.2)
Total Protein: 7.3 g/dL (ref 6.1–8.1)

## 2016-02-26 LAB — MAGNESIUM: MAGNESIUM: 2.1 mg/dL (ref 1.5–2.5)

## 2016-02-26 LAB — VITAMIN D 25 HYDROXY (VIT D DEFICIENCY, FRACTURES): VIT D 25 HYDROXY: 62 ng/mL (ref 30–100)

## 2016-02-26 LAB — TSH: TSH: 1.29 mIU/L

## 2016-02-26 MED ORDER — EZETIMIBE 10 MG PO TABS: ORAL_TABLET | ORAL | Status: DC

## 2016-02-26 NOTE — Patient Instructions (Signed)

## 2016-02-26 NOTE — Progress Notes (Signed)
Patient ID: Jenna Miller, female   DOB: 1956-08-29, 60 y.o.   MRN: [id number]   This very nice 60 y.o. Sep WF presents for 6 month follow up with Hypertension, Hyperlipidemia, Pre-Diabetes and Vitamin D Deficiency.    Patient is treated for HTN since 2009 & BP has been controlled at home. Today's BP: 112/72 mmHg. Patient has had no complaints of any cardiac type chest pain, palpitations, dyspnea/orthopnea/PND, dizziness, claudication, or dependent edema.   Hyperlipidemia is notcontrolled with diet & meds (as patient is Statin intolerant). Patient denies myalgias or other med SE's. Last Lipids were  Cholesterol 179; HDL 51; LDL 115; Triglycerides 63 on 10/16/2015.   Also, the patient has history of PreDiabetes since 2013 with A1c 6.1% and has had no symptoms of reactive hypoglycemia, diabetic polys, paresthesias or visual blurring.  Last A1c was  6.0% on 10/16/2015.   Further, the patient also has history of Vitamin D Deficiency of 39 in 2008 and supplements vitamin D without any suspected side-effects. Last vitamin D was 63 on 07/11/2015.    Medication Sig  . ALPRAZolam  1 MG Take 1/2 to 1 tablet 3 x day if needed for Anxiety or Sleep  . Biotin 5 MG  Take by mouth.  Marland Kitchen CALCIUM 1000 + D Take by mouth daily.  Marland Kitchen VITAMIN D 2000 UNITS Take 5,000 mg by mouth.  Marland Kitchen gemfibrozil (LOPID) 600 MG  TAKE 1 TABLET TWICE A DAY WITH FOOD FOR CHOLESTEROL  . hydrochlorothiazide  25 MG  Take 1 tablet daily for BP & Fluid  . magnesium  400  MG  Take 400 mg by mouth daily.  Marland Kitchen VITAMIN B-6 250 MG  Take 250 mg by mouth daily.  . vitamin B-12100 MCG  Take 100 mcg by mouth.  Bonne Dolores DRINK MIX  Take 1 packet by mouth.  Marland Kitchen ZETIA 10 MG tablet TAKE 1 TABLET BY MOUTH EVERY DAY FOR CHOLESTEROL  . ranitidine (ZANTAC) 300 MG Take 1 to 2 tablets daily as needed for Acid Indigestion & Reflux   Allergies  Allergen Reactions  . Lipitor [Atorvastatin] Anaphylaxis  . Effexor [Venlafaxine]   . Paxil [Paroxetine Hcl]   .  Wellbutrin [Bupropion]     Hives   PMHx:   Past Medical History  Diagnosis Date  . Complication of anesthesia     woke  up X 1  . Vitamin D deficiency   . Hypertension   . High cholesterol   . Acid reflux   . Cancer ([place])     Left Breast  . Arthritis   . Fatty liver    Immunization History  Administered Date(s) Administered  . Influenza Split 08/29/2013, 10/16/2015  . Influenza, Seasonal, Injecte, Preservative Fre 11/02/2014  . PPD Test 03/14/2014, 07/11/2015  . Pneumococcal Polysaccharide-23 05/22/2003  . Td 05/28/2005  . Tdap 07/11/2015   Past Surgical History  Procedure Laterality Date  . Tram  1999-2000  . Knee arthroscopy  1982  . Wisdom tooth extraction    . Cholecystectomy  11/19/2012    Procedure: LAPAROSCOPIC CHOLECYSTECTOMY WITH INTRAOPERATIVE CHOLANGIOGRAM;  Surgeon: Zenovia Jarred, MD;  Location: [place];  Service: General;  Laterality: N/A;  . Breast surgery  1995    mastectomy left  . Hernia repair     FHx:    Reviewed / unchanged  SHx:    Reviewed / unchanged  Systems Review:  Constitutional: Denies fever, chills, wt changes, headaches, insomnia, fatigue, night sweats, change in appetite. Eyes: Denies redness, blurred  vision, diplopia, discharge, itchy, watery eyes.  ENT: Denies discharge, congestion, post nasal drip, epistaxis, sore throat, earache, hearing loss, dental pain, tinnitus, vertigo, sinus pain, snoring.  CV: Denies chest pain, palpitations, irregular heartbeat, syncope, dyspnea, diaphoresis, orthopnea, PND, claudication or edema. Respiratory: denies cough, dyspnea, DOE, pleurisy, hoarseness, laryngitis, wheezing.  Gastrointestinal: Denies dysphagia, odynophagia, heartburn, reflux, water brash, abdominal pain or cramps, nausea, vomiting, bloating, diarrhea, constipation, hematemesis, melena, hematochezia  or hemorrhoids. Genitourinary: Denies dysuria, frequency, urgency, nocturia, hesitancy, discharge, hematuria or flank  pain. Musculoskeletal: Denies arthralgias, myalgias, stiffness, jt. swelling, pain, limping or strain/sprain.  Skin: Denies pruritus, rash, hives, warts, acne, eczema or change in skin lesion(s). Neuro: No weakness, tremor, incoordination, spasms, paresthesia or pain. Psychiatric: Denies confusion, memory loss or sensory loss. Endo: Denies change in weight, skin or hair change.  Heme/Lymph: No excessive bleeding, bruising or enlarged lymph nodes.  Physical Exam  BP 112/72 mmHg  Pulse 72  Temp(Src) 97.3 F (36.3 C)  Resp 16  Ht 5\' 2"  (1.575 m)  Wt 183 lb 12.8 oz (83.371 kg)  BMI 33.61 kg/m2  LMP 06/10/2010  Appears well nourished and in no distress. Eyes: PERRLA, EOMs, conjunctiva no swelling or erythema. Sinuses: No frontal/maxillary tenderness ENT/Mouth: EAC's clear, TM's nl w/o erythema, bulging. Nares clear w/o erythema, swelling, exudates. Oropharynx clear without erythema or exudates. Oral hygiene is good. Tongue normal, non obstructing. Hearing intact.  Neck: Supple. Thyroid nl. Car 2+/2+ without bruits, nodes or JVD. Chest: Respirations nl with BS clear & equal w/o rales, rhonchi, wheezing or stridor.  Cor: Heart sounds normal w/ regular rate and rhythm without sig. murmurs, gallops, clicks, or rubs. Peripheral pulses normal and equal  without edema.  Abdomen: Soft & bowel sounds normal. Non-tender w/o guarding, rebound, hernias, masses, or organomegaly.  Lymphatics: Unremarkable.  Musculoskeletal: Full ROM all peripheral extremities, joint stability, 5/5 strength, and normal gait.  Skin: Warm, dry without exposed rashes, lesions or ecchymosis apparent.  Neuro: Cranial nerves intact, reflexes equal bilaterally. Sensory-motor testing grossly intact. Tendon reflexes grossly intact.  Pysch: Alert & oriented x 3.  Insight and judgement nl & appropriate. No ideations.  Assessment and Plan:  1. Essential hypertension  - TSH  2. Hyperlipidemia  - Lipid panel - TSH  3.  Prediabetes  - Hemoglobin A1c - Insulin, random  4. Vitamin D deficiency  - VITAMIN D 25 Hydroxy   5. Morbid obesity ([place])   6. Gastroesophageal reflux disease   7. Medication management  - CBC with Differential/Platelet - BASIC METABOLIC PANEL WITH GFR - Magnesium - Hepatic function panel   Recommended regular exercise, BP monitoring, weight control, and discussed med and SE's. Recommended labs to assess and monitor clinical status. Further disposition pending results of labs.  Very long discussion about proper diet and recommended Dr Fara Olden Fuhrman's books on dieting & Diabetes as a model & guideline. Over 30 minutes of exam, counseling, chart review was performed

## 2016-02-27 LAB — HEMOGLOBIN A1C
HEMOGLOBIN A1C: 6 % — AB (ref <5.7)
Mean Plasma Glucose: 126 mg/dL — ABNORMAL HIGH (ref <117)

## 2016-02-27 LAB — INSULIN, RANDOM: Insulin: 12.4 u[IU]/mL (ref 2.0–19.6)

## 2016-02-29 ENCOUNTER — Other Ambulatory Visit: Payer: Self-pay | Admitting: Internal Medicine

## 2016-02-29 MED ORDER — PREDNISONE 20 MG PO TABS: ORAL_TABLET | ORAL | Status: DC

## 2016-03-26 ENCOUNTER — Telehealth: Payer: Self-pay | Admitting: *Deleted

## 2016-03-26 MED ORDER — AZITHROMYCIN 250 MG PO TABS: ORAL_TABLET | ORAL | Status: AC

## 2016-03-26 MED ORDER — PHENYLEPH-PROMETHAZINE-COD 5-6.25-10 MG/5ML PO SYRP
ORAL_SOLUTION | ORAL | Status: DC
Start: 2016-03-26 — End: 2016-04-15

## 2016-03-26 NOTE — Telephone Encounter (Signed)
Patient called and stats she has sinus congestion and a cough.  Patint requested an Rx be sent in.  Per Dr Melford Aase, send in an RX for a Z-pak and Phenergan syrup with codeine. Patient is aware RX will be sent.

## 2016-04-15 ENCOUNTER — Encounter: Payer: Self-pay | Admitting: Physician Assistant

## 2016-04-15 ENCOUNTER — Other Ambulatory Visit: Payer: Self-pay

## 2016-04-15 ENCOUNTER — Ambulatory Visit: Payer: Self-pay | Admitting: Internal Medicine

## 2016-04-15 ENCOUNTER — Ambulatory Visit (INDEPENDENT_AMBULATORY_CARE_PROVIDER_SITE_OTHER): Payer: BLUE CROSS/BLUE SHIELD | Admitting: Physician Assistant

## 2016-04-15 ENCOUNTER — Ambulatory Visit (HOSPITAL_COMMUNITY)
Admission: RE | Admit: 2016-04-15 | Discharge: 2016-04-15 | Disposition: A | Discharge status: Home / Self Care | Payer: BLUE CROSS/BLUE SHIELD | Source: Ambulatory Visit | Attending: Physician Assistant | Admitting: Physician Assistant

## 2016-04-15 VITALS — BP 108/60 | HR 68 | Temp 97.7°F | Resp 16 | Ht 62.0 in | Wt 185.6 lb

## 2016-04-15 DIAGNOSIS — R059 Cough, unspecified: Secondary | ICD-10-CM

## 2016-04-15 DIAGNOSIS — R05 Cough: Secondary | ICD-10-CM | POA: Diagnosis not present

## 2016-04-15 DIAGNOSIS — H9201 Otalgia, right ear: Secondary | ICD-10-CM | POA: Diagnosis not present

## 2016-04-15 MED ORDER — PROMETHAZINE-CODEINE 6.25-10 MG/5ML PO SYRP
5.0000 mL | ORAL_SOLUTION | Freq: Four times a day (QID) | ORAL | Status: DC | PRN
Start: 2016-04-15 — End: 2016-06-03

## 2016-04-15 MED ORDER — DEXAMETHASONE SODIUM PHOSPHATE 100 MG/10ML IJ SOLN
10.0000 mg | Freq: Once | INTRAMUSCULAR | Status: AC
  Administered 2016-04-15: 10 mg via INTRAMUSCULAR

## 2016-04-15 MED ORDER — AMOXICILLIN-POT CLAVULANATE 875-125 MG PO TABS: 1.0000 | ORAL_TABLET | Freq: Two times a day (BID) | ORAL | Status: DC

## 2016-04-15 NOTE — Progress Notes (Signed)
Subjective:    Patient ID: Jenna Miller, female    DOB: 1956-08-06, 60 y.o.   MRN: [id number]  HPI 60 y.o. obese WF with history of preDM, CKD, HTN, left breast cancer s/p mastectomy in 1995 presents with cough x 4 weeks, treated with Zpak and phenergan cough syrup 03/29. Still has cough non productive, has right ear pain with decreased hearing, and chest is hurting due to cough. No fever, chills, SOB, CP, wheezing.   Blood pressure 108/60, pulse 68, temperature 97.7 F (36.5 C), temperature source Temporal, resp. rate 16, height 5\' 2"  (1.575 m), weight 185 lb 9.6 oz (84.188 kg), last menstrual period 06/10/2010, SpO2 98 %.  Past Medical History  Diagnosis Date  . Complication of anesthesia     woke  up X 1  . Vitamin D deficiency   . Hypertension   . High cholesterol   . Acid reflux   . Cancer ([place])     Left Breast  . Arthritis   . Fatty liver    Current Outpatient Prescriptions on File Prior to Visit  Medication Sig Dispense Refill  . ALPRAZolam (XANAX) 1 MG tablet Take 1/2 to 1 tablet 3 x day if needed for Anxiety or Sleep 90 tablet 2  . Biotin 5 MG CAPS Take by mouth.    . Calcium Carb-Cholecalciferol (CALCIUM 1000 + D PO) Take by mouth daily.    . Cholecalciferol (VITAMIN D3) 2000 UNITS capsule Take 5,000 mg by mouth.    . ezetimibe (ZETIA) 10 MG tablet Take 1 tablet daily for Cholesterol 90 tablet 1  . gemfibrozil (LOPID) 600 MG tablet TAKE 1 TABLET TWICE A DAY WITH FOOD FOR CHOLESTEROL 180 tablet 1  . hydrochlorothiazide (HYDRODIURIL) 25 MG tablet Take 1 tablet daily for BP & Fluid 90 tablet 3  . magnesium oxide (MAG-OX) 400 (241.3 MG) MG tablet Take 400 mg by mouth daily.    . Pyridoxine HCl (VITAMIN B-6) 250 MG tablet Take 250 mg by mouth daily.    . vitamin B-12 (CYANOCOBALAMIN) 100 MCG tablet Take 100 mcg by mouth.    . ranitidine (ZANTAC) 300 MG tablet Take 1 to 2 tablets daily as needed for Acid Indigestion & Reflux 90 tablet PRN   No current  facility-administered medications on file prior to visit.    Review of Systems  Constitutional: Positive for fatigue. Negative for fever and chills.  HENT: Positive for congestion, ear pain, hearing loss, postnasal drip and sore throat. Negative for drooling, ear discharge, facial swelling, mouth sores, nosebleeds, rhinorrhea, sinus pressure, sneezing, tinnitus, trouble swallowing and voice change.   Eyes: Negative.   Respiratory: Positive for cough. Negative for apnea, choking, chest tightness, shortness of breath, wheezing and stridor.   Cardiovascular: Negative.  Negative for chest pain.  Gastrointestinal: Negative.   Genitourinary: Negative.   Musculoskeletal: Negative.   Neurological: Negative.        Objective:   Physical Exam  Constitutional: She is oriented to person, place, and time. She appears well-developed and well-nourished.  HENT:  Right Ear: Hearing and external ear normal. No mastoid tenderness. Tympanic membrane is perforated. Tympanic membrane is not injected, not erythematous, not retracted and not bulging. No middle ear effusion.  Left Ear: Hearing and external ear normal. No mastoid tenderness. Tympanic membrane is injected. Tympanic membrane is not perforated, not erythematous, not retracted and not bulging. A middle ear effusion is present.  Ears:  Nose: Right sinus exhibits maxillary sinus tenderness. Left sinus exhibits maxillary sinus  tenderness.  Mouth/Throat: Uvula is midline, oropharynx is clear and moist and mucous membranes are normal.  Eyes: Conjunctivae and EOM are normal. Pupils are equal, round, and reactive to light.  Neck: Neck supple.  Cardiovascular: Normal rate and regular rhythm.   Pulmonary/Chest: Effort normal and breath sounds normal. No respiratory distress. She has no wheezes.  Abdominal: Soft. Bowel sounds are normal.  Musculoskeletal: Normal range of motion.  Lymphadenopathy:    She has no cervical adenopathy.  Neurological: She is  alert and oriented to person, place, and time.  Skin: Skin is warm and dry.       Assessment & Plan:  1. Cough Will get CXR, augmentin, prednisone shot, cough syrup sent in - DG Chest 2 View; Future - dexamethasone (DECADRON) injection 10 mg; Inject 1 mL (10 mg total) into the muscle once.  2. Right ear pain S/p perforation, if not better in 2-4 weeks will refer to ENT

## 2016-04-15 NOTE — Patient Instructions (Signed)
Eardrum Perforation An eardrum perforation is a puncture or tear in the eardrum. This is also called a ruptured eardrum. The eardrum is a thin, round tissue inside of your ear that separates your ear canal from your middle ear. This is the tissue that detects sound and enables you to hear. An eardrum perforation can cause discomfort and hearing loss. In most cases, the eardrum will heal without treatment and with little or no permanent hearing loss. CAUSES An eardrum perforation can result from different causes, including:  Sudden pressure changes that happen in situations such as scuba diving or flying in an airplane.  Foreign objects in the ear.  Inserting a cotton-tipped swab or any blunt object into the ear.  Loud noise.  Trauma to the ear.  Attempting to remove an object from the ear. SIGNS AND SYMPTOMS  Hearing loss.  Ear pain.  Ringing in the ear.  Discharge or bleeding from the ear.  Dizziness.  Vomiting.  Facial paralysis. DIAGNOSIS  Your health care provider will examine your ear using a tool called an otoscope. This tool allows the health care provider to see into your ear to examine your eardrum. Various types of hearing tests may also be done. TREATMENT  Typically, the eardrum will heal on its own within a few weeks. If your eardrum does not heal, your health care provider may recommend one of the following treatments:  A procedure to place a patch over your eardrum.  Surgery to repair your eardrum. HOME CARE INSTRUCTIONS   Keep your ear dry. This will improve healing. Do not submerge your head under water until healing is complete. Do not swim or dive until your health care provider approves. While bathing or showering, protect your ear using one of these methods:  Using a waterproof earplug.  Covering a piece of cotton with petroleum jelly and placing it in your outer ear canal.  Take medicines only as directed by your health care provider.  Avoid  blowing your nose if possible. If you blow your nose, do it gently. Forceful blowing increases the pressure in your middle ear. This may cause further injury or may delay your healing.  Resume your normal activities after the perforation has healed. Your health care provider can tell you when this has occurred.  Talk to your health care provider before you fly on an airplane. Air travel is generally allowed with a perforated eardrum.  Keep all follow-up visits as directed by your health care provider. This is important. SEEK MEDICAL CARE IF:  You have a fever. SEEK IMMEDIATE MEDICAL CARE IF:  You have blood or pus coming from your ear.  You have dizziness or problems with balance.  You have nausea or vomiting.  You have increased pain.   This information is not intended to replace advice given to you by your health care provider. Make sure you discuss any questions you have with your health care provider.   Document Released: 12/12/2000 Document Revised: 01/05/2015 Document Reviewed: 07/24/2014 Elsevier Interactive Patient Education 2016 Elsevier Inc[employer] Cough, Adult Coughing is a reflex that clears your throat and your airways. Coughing helps to heal and protect your lungs. It is normal to cough occasionally, but a cough that happens with other symptoms or lasts a long time may be a sign of a condition that needs treatment. A cough may last only 2-3 weeks (acute), or it may last longer than 8 weeks (chronic). CAUSES Coughing is commonly caused by:  Breathing in substances that irritate  your lungs.  A viral or bacterial respiratory infection.  Allergies.  Asthma.  Postnasal drip.  Smoking.  Acid backing up from the stomach into the esophagus (gastroesophageal reflux).  Certain medicines.  Chronic lung problems, including COPD (or rarely, lung cancer).  Other medical conditions such as heart failure. HOME CARE INSTRUCTIONS  Pay attention to any changes in your symptoms.  Take these actions to help with your discomfort:  Take medicines only as told by your health care provider.  If you were prescribed an antibiotic medicine, take it as told by your health care provider. Do not stop taking the antibiotic even if you start to feel better.  Talk with your health care provider before you take a cough suppressant medicine.  Drink enough fluid to keep your urine clear or pale yellow.  If the air is dry, use a cold steam vaporizer or humidifier in your bedroom or your home to help loosen secretions.  Avoid anything that causes you to cough at work or at home.  If your cough is worse at night, try sleeping in a semi-upright position.  Avoid cigarette smoke. If you smoke, quit smoking. If you need help quitting, ask your health care provider.  Avoid caffeine.  Avoid alcohol.  Rest as needed. SEEK MEDICAL CARE IF:   You have new symptoms.  You cough up pus.  Your cough does not get better after 2-3 weeks, or your cough gets worse.  You cannot control your cough with suppressant medicines and you are losing sleep.  You develop pain that is getting worse or pain that is not controlled with pain medicines.  You have a fever.  You have unexplained weight loss.  You have night sweats. SEEK IMMEDIATE MEDICAL CARE IF:  You cough up blood.  You have difficulty breathing.  Your heartbeat is very fast.   This information is not intended to replace advice given to you by your health care provider. Make sure you discuss any questions you have with your health care provider.   Document Released: 06/13/2011 Document Revised: 09/05/2015 Document Reviewed: 02/21/2015 Elsevier Interactive Patient Education [employer].

## 2016-04-16 ENCOUNTER — Encounter: Payer: Self-pay | Admitting: Physician Assistant

## 2016-04-21 ENCOUNTER — Other Ambulatory Visit: Payer: Self-pay | Admitting: Internal Medicine

## 2016-05-22 ENCOUNTER — Other Ambulatory Visit: Payer: Self-pay | Admitting: Internal Medicine

## 2016-06-03 ENCOUNTER — Ambulatory Visit (INDEPENDENT_AMBULATORY_CARE_PROVIDER_SITE_OTHER): Payer: BLUE CROSS/BLUE SHIELD | Admitting: Physician Assistant

## 2016-06-03 ENCOUNTER — Encounter: Payer: Self-pay | Admitting: Physician Assistant

## 2016-06-03 VITALS — BP 126/80 | HR 58 | Temp 97.8°F | Resp 16 | Ht 62.0 in | Wt 174.0 lb

## 2016-06-03 DIAGNOSIS — E782 Mixed hyperlipidemia: Secondary | ICD-10-CM | POA: Diagnosis not present

## 2016-06-03 DIAGNOSIS — Z79899 Other long term (current) drug therapy: Secondary | ICD-10-CM | POA: Diagnosis not present

## 2016-06-03 DIAGNOSIS — I10 Essential (primary) hypertension: Secondary | ICD-10-CM

## 2016-06-03 DIAGNOSIS — L659 Nonscarring hair loss, unspecified: Secondary | ICD-10-CM

## 2016-06-03 DIAGNOSIS — R7303 Prediabetes: Secondary | ICD-10-CM

## 2016-06-03 DIAGNOSIS — E559 Vitamin D deficiency, unspecified: Secondary | ICD-10-CM

## 2016-06-03 DIAGNOSIS — N182 Chronic kidney disease, stage 2 (mild): Secondary | ICD-10-CM | POA: Diagnosis not present

## 2016-06-03 LAB — HEPATIC FUNCTION PANEL
ALBUMIN: 4.5 g/dL (ref 3.6–5.1)
ALT: 23 U/L (ref 6–29)
AST: 21 U/L (ref 10–35)
Alkaline Phosphatase: 53 U/L (ref 33–130)
Bilirubin, Direct: 0.1 mg/dL (ref <=0.2)
Indirect Bilirubin: 0.3 mg/dL (ref 0.2–1.2)
TOTAL PROTEIN: 6.9 g/dL (ref 6.1–8.1)
Total Bilirubin: 0.4 mg/dL (ref 0.2–1.2)

## 2016-06-03 LAB — IRON AND TIBC
%SAT: 15 % (ref 11–50)
IRON: 76 ug/dL (ref 45–160)
TIBC: 522 ug/dL — ABNORMAL HIGH (ref 250–450)
UIBC: 446 ug/dL — ABNORMAL HIGH (ref 125–400)

## 2016-06-03 LAB — BASIC METABOLIC PANEL WITH GFR
BUN: 17 mg/dL (ref 7–25)
CHLORIDE: 106 mmol/L (ref 98–110)
CO2: 26 mmol/L (ref 20–31)
CREATININE: 0.85 mg/dL (ref 0.50–0.99)
Calcium: 10 mg/dL (ref 8.6–10.4)
GFR, Est African American: 86 mL/min (ref >=60)
GFR, Est Non African American: 74 mL/min (ref >=60)
GLUCOSE: 105 mg/dL — AB (ref 65–99)
Potassium: 4.9 mmol/L (ref 3.5–5.3)
Sodium: 144 mmol/L (ref 135–146)

## 2016-06-03 LAB — CBC WITH DIFFERENTIAL/PLATELET
BASOS ABS: 0 {cells}/uL (ref 0–200)
Basophils Relative: 0 %
Eosinophils Absolute: 100 cells/uL (ref 15–500)
Eosinophils Relative: 2 %
HEMATOCRIT: 36 % (ref 35.0–45.0)
HEMOGLOBIN: 12.1 g/dL (ref 11.7–15.5)
LYMPHS ABS: 1450 {cells}/uL (ref 850–3900)
Lymphocytes Relative: 29 %
MCH: 29.5 pg (ref 27.0–33.0)
MCHC: 33.6 g/dL (ref 32.0–36.0)
MCV: 87.8 fL (ref 80.0–100.0)
MONO ABS: 450 {cells}/uL (ref 200–950)
MONOS PCT: 9 %
MPV: 10.7 fL (ref 7.5–12.5)
NEUTROS ABS: 3000 {cells}/uL (ref 1500–7800)
Neutrophils Relative %: 60 %
PLATELETS: 266 10*3/uL (ref 140–400)
RBC: 4.1 MIL/uL (ref 3.80–5.10)
RDW: 14 % (ref 11.0–15.0)
WBC: 5 10*3/uL (ref 3.8–10.8)

## 2016-06-03 LAB — LIPID PANEL
Cholesterol: 177 mg/dL (ref 125–200)
HDL: 59 mg/dL (ref >=46)
LDL CALC: 107 mg/dL (ref <130)
Total CHOL/HDL Ratio: 3 Ratio (ref <=5.0)
Triglycerides: 57 mg/dL (ref <150)
VLDL: 11 mg/dL (ref <30)

## 2016-06-03 LAB — HEMOGLOBIN A1C
HEMOGLOBIN A1C: 5.8 % — AB (ref <5.7)
MEAN PLASMA GLUCOSE: 120 mg/dL

## 2016-06-03 LAB — MAGNESIUM: Magnesium: 2.1 mg/dL (ref 1.5–2.5)

## 2016-06-03 NOTE — Progress Notes (Signed)
Patient ID: Jenna Miller, female   DOB: Jun 28, 1956, 60 y.o.   MRN: [id number]  Assessment and Plan:  Hypertension:  -Continue medication,  -monitor blood pressure at home.  -Continue DASH diet.   -Reminder to go to the ER if any CP, SOB, nausea, dizziness, severe HA, changes vision/speech, left arm numbness and tingling, and jaw pain.  Cholesterol: -Continue diet and exercise.  -Check cholesterol.   Pre-diabetes: -Continue diet and exercise.  -Check A1C  Vitamin D Def: -check level -continue medications.   Obesity with co morbidities - long discussion about weight loss, diet, and exercise  Hair falling out Check folate, iron, ferritin, zinc. Decrease stress, continue weight loss  Continue diet and meds as discussed. Further disposition pending results of labs.  HPI 60 y.o. female  presents for 3 month follow up with hypertension, hyperlipidemia, prediabetes and vitamin D.   Her blood pressure has been controlled at home, today their BP is BP: 126/80 mmHg.   She does workout, has been walking, trying to get 6000 steps a day and she has a new stand up desk at work. She denies chest pain, shortness of breath, dizziness. States her hair has been falling out more the past 3 days.  She has hypertensive CKD stage 2 Lab Results  Component Value Date   GFRNONAA 73 02/26/2016    She is on cholesterol medication, zetia and denies myalgias. Her cholesterol is at goal. The cholesterol last visit was:   Lab Results  Component Value Date   CHOL 171 02/26/2016   HDL 53 02/26/2016   LDLCALC 107 02/26/2016   TRIG 56 02/26/2016   CHOLHDL 3.2 02/26/2016    She has been working on diet and exercise for prediabetes, and denies foot ulcerations, hyperglycemia, hypoglycemia , increased appetite, nausea, paresthesia of the feet, polydipsia, polyuria, visual disturbances, vomiting and weight loss. Last A1C in the office was:  Lab Results  Component Value Date   HGBA1C 6.0* 02/26/2016    Patient is on Vitamin D supplement.  Lab Results  Component Value Date   VD25OH 62 02/26/2016     BMI is Body mass index is 31.82 kg/(m^2)., she is working on diet and exercise. Wt Readings from Last 3 Encounters:  06/03/16 174 lb (78.926 kg)  04/15/16 185 lb 9.6 oz (84.188 kg)  02/26/16 183 lb 12.8 oz (83.371 kg)     Current Medications:  Current Outpatient Prescriptions on File Prior to Visit  Medication Sig Dispense Refill  . ALPRAZolam (XANAX) 1 MG tablet Take 1/2 to 1 tablet 3 x day if needed for Anxiety or Sleep 90 tablet 2  . Biotin 5 MG CAPS Take by mouth.    . Calcium Carb-Cholecalciferol (CALCIUM 1000 + D PO) Take by mouth daily.    . Cholecalciferol (VITAMIN D3) 2000 UNITS capsule Take 5,000 mg by mouth.    . Coenzyme Q10 (Q-10 CO-ENZYME PO) Take by mouth.    . ezetimibe (ZETIA) 10 MG tablet Take 1 tablet daily for Cholesterol 90 tablet 1  . gemfibrozil (LOPID) 600 MG tablet TAKE 1 TABLET TWICE A DAY WITH FOOD FOR CHOLESTEROL 180 tablet 2  . hydrochlorothiazide (HYDRODIURIL) 25 MG tablet TAKE 1 TABLET DAILY FOR BP & FLUID 90 tablet 0  . magnesium oxide (MAG-OX) 400 (241.3 MG) MG tablet Take 400 mg by mouth daily.    . Pyridoxine HCl (VITAMIN B-6) 250 MG tablet Take 250 mg by mouth daily.    . ranitidine (ZANTAC) 300 MG tablet Take 1  to 2 tablets daily as needed for Acid Indigestion & Reflux 90 tablet PRN  . vitamin B-12 (CYANOCOBALAMIN) 100 MCG tablet Take 100 mcg by mouth.     No current facility-administered medications on file prior to visit.    Medical History:  Past Medical History  Diagnosis Date  . Complication of anesthesia     woke  up X 1  . Vitamin D deficiency   . Hypertension   . High cholesterol   . Acid reflux   . Cancer ([place])     Left Breast  . Arthritis   . Fatty liver     Allergies:  Allergies  Allergen Reactions  . Lipitor [Atorvastatin] Anaphylaxis and Swelling    Other reaction(s): Other (See Comments) Unknown  . Effexor  [Venlafaxine]     Other reaction(s): Other (See Comments)  . Paxil [Paroxetine Hcl]     Other reaction(s): Other (See Comments)  . Wellbutrin [Bupropion]     Other reaction(s): Other (See Comments) Hives Unknown Hives     Review of Systems:  Review of Systems  Constitutional: Negative for fever, chills and malaise/fatigue.  HENT: Negative for congestion, ear pain and sore throat.   Respiratory: Negative for cough, shortness of breath and wheezing.   Cardiovascular: Negative for chest pain, palpitations and leg swelling.  Gastrointestinal: Negative for heartburn, nausea, diarrhea, constipation, blood in stool and melena.  Genitourinary: Negative.   Neurological: Negative for dizziness, sensory change, loss of consciousness and headaches.  Psychiatric/Behavioral: Negative for depression. The patient is not nervous/anxious and does not have insomnia.     Family history- Review and unchanged  Social history- Review and unchanged  Physical Exam: BP 126/80 mmHg  Pulse 58  Temp(Src) 97.8 F (36.6 C) (Temporal)  Resp 16  Ht 5\' 2"  (1.575 m)  Wt 174 lb (78.926 kg)  BMI 31.82 kg/m2  LMP 06/10/2010 Wt Readings from Last 3 Encounters:  06/03/16 174 lb (78.926 kg)  04/15/16 185 lb 9.6 oz (84.188 kg)  02/26/16 183 lb 12.8 oz (83.371 kg)    General Appearance: Well nourished well developed, in no apparent distress. Eyes: PERRLA, EOMs, conjunctiva no swelling or erythema ENT/Mouth: Ear canals normal without obstruction, swelling, erythma, discharge.  TMs normal bilaterally.  Oropharynx moist, clear, without exudate, or postoropharyngeal swelling. Neck: Supple, thyroid normal,no cervical adenopathy  Respiratory: Respiratory effort normal, Breath sounds clear A&P without rhonchi, wheeze, or rale.  No retractions, no accessory usage. Cardio: RRR with no MRGs. Brisk peripheral pulses without edema.  Abdomen: Soft, + BS,  Non tender, no guarding, rebound, hernias, masses. Musculoskeletal:  Full ROM, 5/5 strength, Normal gait Skin: Warm, dry without rashes, lesions, ecchymosis.  Neuro: Awake and oriented X 3, Cranial nerves intact. Normal muscle tone, no cerebellar symptoms. Psych: Normal affect, Insight and Judgment appropriate.    Vicie Mutters, PA-C 8:48 AM [facility]

## 2016-06-03 NOTE — Patient Instructions (Addendum)
Okay to take the fluid pill every other day, can take daily if your BP is above 140/90 or worsening edema  Take the magnesium every other day  Stop the lopid if your cholesterol is better.   Stop the calcium  We want weight loss that will last so you should lose 1-2 pounds a week.  THAT IS IT! Please pick THREE things a month to change. Once it is a habit check off the item. Then pick another three items off the list to become habits.  If you are already doing a habit on the list GREAT!  Cross that item off! o Don't drink your calories. Ie, alcohol, soda, fruit juice, and sweet tea.  o Drink more water. Drink a glass when you feel hungry or before each meal.  o Eat breakfast - Complex carb and protein (likeDannon light and fit yogurt, oatmeal, fruit, eggs, [place] bacon). o Measure your cereal.  Eat no more than one cup a day. (ie [place]) o Eat an apple a day. o Add a vegetable a day. o Try a new vegetable a month. o Use Pam! Stop using oil or butter to cook. o Don't finish your plate or use smaller plates. o Share your dessert. o Eat sugar free Jello for dessert or frozen grapes. o Don't eat 2-3 hours before bed. o Switch to whole wheat bread, pasta, and brown rice. o Make healthier choices when you eat out. No fries! o Pick baked chicken, NOT fried. o Don't forget to SLOW DOWN when you eat. It is not going anywhere.  o Take the stairs. o Park far away in the parking lot o [employer] (or weights) for 10 minutes while watching TV. o Walk at work for 10 minutes during break. o Walk outside 1 time a week with your friend, kids, dog, or significant other. o Start a walking group at [place] the mall as much as you can tolerate.  o Keep a food diary. o Weigh yourself daily. o Walk for 15 minutes 3 days per week. o Cook at home more often and eat out less.  If life happens and you go back to old habits, it is okay.  Just start over. You can do it!   If you experience chest  pain, get short of breath, or tired during the exercise, please stop immediately and inform your doctor.      Bad carbs also include fruit juice, alcohol, and sweet tea. These are empty calories that do not signal to your brain that you are full.   Please remember the good carbs are still carbs which convert into sugar. So please measure them out no more than 1/2-1 cup of rice, oatmeal, pasta, and beans  Veggies are however free foods! Pile them on.   Not all fruit is created equal. Please see the list below, the fruit at the bottom is higher in sugars than the fruit at the top. Please avoid all dried fruits.

## 2016-06-04 ENCOUNTER — Encounter: Payer: Self-pay | Admitting: Physician Assistant

## 2016-06-04 LAB — FOLATE RBC: RBC Folate: 764 ng/mL (ref >280)

## 2016-06-04 LAB — VITAMIN D 25 HYDROXY (VIT D DEFICIENCY, FRACTURES): VIT D 25 HYDROXY: 69 ng/mL (ref 30–100)

## 2016-06-04 LAB — FERRITIN: Ferritin: 85 ng/mL (ref 20–288)

## 2016-06-04 LAB — TSH: TSH: 0.98 m[IU]/L

## 2016-06-06 LAB — ZINC: Zinc: 84 ug/dL (ref 60–130)

## 2016-07-17 ENCOUNTER — Encounter: Payer: Self-pay | Admitting: Internal Medicine

## 2016-07-17 ENCOUNTER — Other Ambulatory Visit: Payer: Self-pay | Admitting: *Deleted

## 2016-07-17 DIAGNOSIS — K219 Gastro-esophageal reflux disease without esophagitis: Secondary | ICD-10-CM

## 2016-07-17 MED ORDER — RANITIDINE HCL 300 MG PO TABS: ORAL_TABLET | ORAL | Status: DC

## 2016-08-12 ENCOUNTER — Other Ambulatory Visit: Payer: Self-pay | Admitting: Obstetrics and Gynecology

## 2016-08-12 DIAGNOSIS — Z1231 Encounter for screening mammogram for malignant neoplasm of breast: Secondary | ICD-10-CM

## 2016-08-25 ENCOUNTER — Other Ambulatory Visit: Payer: Self-pay | Admitting: Internal Medicine

## 2016-08-26 ENCOUNTER — Other Ambulatory Visit: Payer: Self-pay | Admitting: Physician Assistant

## 2016-08-26 ENCOUNTER — Other Ambulatory Visit: Payer: Self-pay | Admitting: *Deleted

## 2016-08-26 DIAGNOSIS — K219 Gastro-esophageal reflux disease without esophagitis: Secondary | ICD-10-CM

## 2016-08-26 MED ORDER — RANITIDINE HCL 300 MG PO TABS: ORAL_TABLET | ORAL | 99 refills | Status: DC

## 2016-09-09 ENCOUNTER — Ambulatory Visit
Admission: RE | Admit: 2016-09-09 | Discharge: 2016-09-09 | Disposition: A | Discharge status: Home / Self Care | Payer: BLUE CROSS/BLUE SHIELD | Source: Ambulatory Visit | Attending: Obstetrics and Gynecology | Admitting: Obstetrics and Gynecology

## 2016-09-09 DIAGNOSIS — Z1231 Encounter for screening mammogram for malignant neoplasm of breast: Secondary | ICD-10-CM | POA: Diagnosis not present

## 2016-09-15 ENCOUNTER — Encounter: Payer: Self-pay | Admitting: Internal Medicine

## 2016-09-15 ENCOUNTER — Ambulatory Visit: Payer: Self-pay | Admitting: Internal Medicine

## 2016-09-15 NOTE — Progress Notes (Signed)
NO SHOW

## 2016-09-24 ENCOUNTER — Encounter: Payer: Self-pay | Admitting: Internal Medicine

## 2016-09-24 ENCOUNTER — Ambulatory Visit (INDEPENDENT_AMBULATORY_CARE_PROVIDER_SITE_OTHER): Payer: BLUE CROSS/BLUE SHIELD | Admitting: Internal Medicine

## 2016-09-24 VITALS — BP 110/64 | HR 62 | Temp 98.0°F | Resp 16 | Ht 61.5 in | Wt 182.0 lb

## 2016-09-24 DIAGNOSIS — E559 Vitamin D deficiency, unspecified: Secondary | ICD-10-CM | POA: Diagnosis not present

## 2016-09-24 DIAGNOSIS — Z79899 Other long term (current) drug therapy: Secondary | ICD-10-CM | POA: Diagnosis not present

## 2016-09-24 DIAGNOSIS — Z0000 Encounter for general adult medical examination without abnormal findings: Secondary | ICD-10-CM | POA: Diagnosis not present

## 2016-09-24 DIAGNOSIS — N182 Chronic kidney disease, stage 2 (mild): Secondary | ICD-10-CM

## 2016-09-24 DIAGNOSIS — D509 Iron deficiency anemia, unspecified: Secondary | ICD-10-CM

## 2016-09-24 DIAGNOSIS — Z23 Encounter for immunization: Secondary | ICD-10-CM

## 2016-09-24 DIAGNOSIS — Z136 Encounter for screening for cardiovascular disorders: Secondary | ICD-10-CM | POA: Diagnosis not present

## 2016-09-24 DIAGNOSIS — R7303 Prediabetes: Secondary | ICD-10-CM

## 2016-09-24 DIAGNOSIS — K760 Fatty (change of) liver, not elsewhere classified: Secondary | ICD-10-CM

## 2016-09-24 DIAGNOSIS — I10 Essential (primary) hypertension: Secondary | ICD-10-CM | POA: Diagnosis not present

## 2016-09-24 DIAGNOSIS — E782 Mixed hyperlipidemia: Secondary | ICD-10-CM

## 2016-09-24 DIAGNOSIS — C50912 Malignant neoplasm of unspecified site of left female breast: Secondary | ICD-10-CM

## 2016-09-24 LAB — BASIC METABOLIC PANEL WITH GFR
BUN: 16 mg/dL (ref 7–25)
CALCIUM: 9.6 mg/dL (ref 8.6–10.4)
CO2: 24 mmol/L (ref 20–31)
CREATININE: 0.8 mg/dL (ref 0.50–0.99)
Chloride: 103 mmol/L (ref 98–110)
GFR, Est African American: >89 mL/min (ref >=60)
GFR, Est Non African American: 80 mL/min (ref >=60)
Glucose, Bld: 92 mg/dL (ref 65–99)
Potassium: 4.3 mmol/L (ref 3.5–5.3)
SODIUM: 139 mmol/L (ref 135–146)

## 2016-09-24 LAB — CBC WITH DIFFERENTIAL/PLATELET
BASOS ABS: 0 {cells}/uL (ref 0–200)
Basophils Relative: 0 %
EOS PCT: 3 %
Eosinophils Absolute: 159 cells/uL (ref 15–500)
HCT: 38.1 % (ref 35.0–45.0)
Hemoglobin: 12.8 g/dL (ref 11.7–15.5)
LYMPHS PCT: 36 %
Lymphs Abs: 1908 cells/uL (ref 850–3900)
MCH: 29.5 pg (ref 27.0–33.0)
MCHC: 33.6 g/dL (ref 32.0–36.0)
MCV: 87.8 fL (ref 80.0–100.0)
MONOS PCT: 6 %
MPV: 10.3 fL (ref 7.5–12.5)
Monocytes Absolute: 318 cells/uL (ref 200–950)
NEUTROS PCT: 55 %
Neutro Abs: 2915 cells/uL (ref 1500–7800)
PLATELETS: 251 10*3/uL (ref 140–400)
RBC: 4.34 MIL/uL (ref 3.80–5.10)
RDW: 14.2 % (ref 11.0–15.0)
WBC: 5.3 10*3/uL (ref 3.8–10.8)

## 2016-09-24 LAB — HEPATIC FUNCTION PANEL
ALT: 17 U/L (ref 6–29)
AST: 17 U/L (ref 10–35)
Albumin: 4.5 g/dL (ref 3.6–5.1)
Alkaline Phosphatase: 49 U/L (ref 33–130)
BILIRUBIN DIRECT: 0.1 mg/dL (ref <=0.2)
Indirect Bilirubin: 0.3 mg/dL (ref 0.2–1.2)
Total Bilirubin: 0.4 mg/dL (ref 0.2–1.2)
Total Protein: 7 g/dL (ref 6.1–8.1)

## 2016-09-24 LAB — IRON AND TIBC
%SAT: 17 % (ref 11–50)
IRON: 72 ug/dL (ref 45–160)
TIBC: 418 ug/dL (ref 250–450)
UIBC: 346 ug/dL (ref 125–400)

## 2016-09-24 LAB — LIPID PANEL
CHOL/HDL RATIO: 3 ratio (ref <=5.0)
CHOLESTEROL: 186 mg/dL (ref 125–200)
HDL: 61 mg/dL (ref >=46)
LDL Cholesterol: 109 mg/dL (ref <130)
Triglycerides: 79 mg/dL (ref <150)
VLDL: 16 mg/dL (ref <30)

## 2016-09-24 LAB — VITAMIN B12: Vitamin B-12: 1640 pg/mL — ABNORMAL HIGH (ref 200–1100)

## 2016-09-24 LAB — TSH: TSH: 1.42 m[IU]/L

## 2016-09-24 LAB — MAGNESIUM: MAGNESIUM: 2.2 mg/dL (ref 1.5–2.5)

## 2016-09-24 NOTE — Progress Notes (Signed)
Complete Physical  Assessment and Plan:   1. Routine general medical examination at a health care facility  - CBC with Differential/Platelet - BASIC METABOLIC PANEL WITH GFR - Hepatic function panel - Magnesium  2. Need for prophylactic vaccination and inoculation against influenza  - Flu Vaccine QUAD with presevative  3. Essential hypertension -cont current meds - Urinalysis, Routine w reflex microscopic (not at [facility]) - Microalbumin / creatinine urine ratio - EKG 12-Lead - TSH  4. CKD Stage 2 (GFR 69 ml/min) -cont hydration -tight control of BP and Blood sugars  5. Hyperlipidemia -cont diet and exercise - Lipid panel  6. Prediabetes -cont diet and exercise - Hemoglobin A1c - Insulin, random  7. Vitamin D deficiency -cont supplement - VITAMIN D 25 Hydroxy (Vit-D Deficiency, Fractures)  8. Medication management   9. Fatty liver Cont diet and exercise  10. Morbid obesity, unspecified obesity type ([place]) -is doing well on current exercise plan -recommended increasing calories to 1400-1600 as she is overrestricting currently -mix up exercise routint  11. Malignant neoplasm of left female breast, unspecified site of breast ([place]) -cont yearly mammograms -graduated from oncology program  12. Anemia, iron deficiency - Iron and TIBC - Vitamin B12   Discussed med's effects and SE's. Screening labs and tests as requested with regular follow-up as recommended.  HPI  60 y.o. female  presents for a complete physical.  Her blood pressure has been controlled at home, today their BP is BP: 110/64.  She does not workout. She denies chest pain, shortness of breath, dizziness.   She is on cholesterol medication and denies myalgias. Her cholesterol is at goal. The cholesterol last visit was:  Lab Results  Component Value Date   CHOL 177 06/03/2016   HDL 59 06/03/2016   LDLCALC 107 06/03/2016   TRIG 57 06/03/2016   CHOLHDL 3.0 06/03/2016  .  She has been working  on diet and exercise for prediabetes, she is on bASA, she is on ACE/ARB and denies foot ulcerations, hyperglycemia, hypoglycemia , increased appetite, nausea, paresthesia of the feet, polydipsia, polyuria, visual disturbances, vomiting and weight loss. Last A1C in the office was:  Lab Results  Component Value Date   HGBA1C 5.8 (H) 06/03/2016    Patient is on Vitamin D supplement.   Lab Results  Component Value Date   VD25OH 69 06/03/2016     She reports that she is trying to increase her walking.  She averages around 9,000 steps.  She reports that she is upset that she feels like her weight isn't changing much.  She reports that she went from 6,000-8,000 to 10,000 steps.  She reports that she was hoping to lose more weight.  She reports that she has added in more protein in her diet.  She reports that she has been trying to snack on beef jerky or [place] jerky.  She reports that she still has some sweets but cuts back on the portion sizes.    Current Medications:  Current Outpatient Prescriptions on File Prior to Visit  Medication Sig Dispense Refill  . ALPRAZolam (XANAX) 1 MG tablet Take 1/2 to 1 tablet 3 x day if needed for Anxiety or Sleep 90 tablet 2  . Biotin 5 MG CAPS Take by mouth.    . Cholecalciferol (VITAMIN D3) 2000 UNITS capsule Take 5,000 mg by mouth.    . Coenzyme Q10 (Q-10 CO-ENZYME PO) Take by mouth.    . ezetimibe (ZETIA) 10 MG tablet TAKE 1 TABLET BY MOUTH EVERY  DAY FOR CHOLESTEROL**INS TO PAY 9/23** 90 tablet 1  . hydrochlorothiazide (HYDRODIURIL) 25 MG tablet TAKE 1 TABLET BY MOUTH EVERY DAY FOR BLOOD PRESSURE AND FLUID 90 tablet 0  . pyridOXINE (VITAMIN B-6) 25 MG tablet Take 25 mg by mouth.    . ranitidine (ZANTAC) 300 MG tablet Take 1 to 2 tablets daily as needed for Acid Indigestion & Reflux 180 tablet PRN  . vitamin B-12 (CYANOCOBALAMIN) 100 MCG tablet Take 100 mcg by mouth.     No current facility-administered medications on file prior to visit.     Health  Maintenance:   Immunization History  Administered Date(s) Administered  . Influenza Split 08/29/2013, 10/16/2015  . Influenza, Seasonal, Injecte, Preservative Fre 11/02/2014  . Influenza,inj,quad, With Preservative 09/24/2016  . PPD Test 03/14/2014, 07/11/2015  . Pneumococcal Polysaccharide-23 05/22/2003  . Td 05/28/2005  . Tdap 07/11/2015  . Zoster 02/26/2016    Tetanus: 2016 Pneumovax: 2004 Flu vaccine: 2016 Zostavax: 2017 MGM: 09/11/16 Colonoscopy: 2012  Patient Care Team: Unk Pinto, MD as PCP - General (Internal Medicine)  Allergies:  Allergies  Allergen Reactions  . Lipitor [Atorvastatin] Anaphylaxis and Swelling    Other reaction(s): Other (See Comments) Unknown  . Effexor [Venlafaxine]     Other reaction(s): Other (See Comments)  . Paxil [Paroxetine Hcl]     Other reaction(s): Other (See Comments)  . Wellbutrin [Bupropion]     Other reaction(s): Other (See Comments) Hives Unknown Hives    Medical History:  Past Medical History:  Diagnosis Date  . Acid reflux   . Arthritis   . Cancer ([place])    Left Breast  . Complication of anesthesia    woke  up X 1  . Fatty liver   . High cholesterol   . Hypertension   . Vitamin D deficiency     Surgical History:  Past Surgical History:  Procedure Laterality Date  . BREAST SURGERY  1995   mastectomy left  . CHOLECYSTECTOMY  11/19/2012   Procedure: LAPAROSCOPIC CHOLECYSTECTOMY WITH INTRAOPERATIVE CHOLANGIOGRAM;  Surgeon: Zenovia Jarred, MD;  Location: [place];  Service: General;  Laterality: N/A;  . [place] ARTHROSCOPY  1982  . TRAM  1999-2000  . WISDOM TOOTH EXTRACTION      Family History:  Family History  Problem Relation Age of Onset  . Cancer Cousin     lung  . Hypertension Mother   . Hyperlipidemia Mother   . Hyperlipidemia Brother   . Hypertension Brother     Social History:  Social History  Substance Use Topics  . Smoking status: Never Smoker  . Smokeless tobacco: Not  on file  . Alcohol use Yes     Comment: rare    Review of Systems: Review of Systems  Constitutional: Negative for chills, fever and malaise/fatigue.  HENT: Negative for congestion, ear pain and sore throat.   Eyes: Negative.   Respiratory: Negative for cough, shortness of breath and wheezing.   Cardiovascular: Negative for chest pain, palpitations and leg swelling.  Gastrointestinal: Negative for abdominal pain, blood in stool, constipation, diarrhea, heartburn and melena.  Genitourinary: Negative.   Skin: Negative.   Neurological: Negative for dizziness, sensory change, loss of consciousness and headaches.  Psychiatric/Behavioral: Negative for depression. The patient is not nervous/anxious and does not have insomnia.     Physical Exam: Estimated body mass index is 33.83 kg/m as calculated from the following:   Height as of this encounter: 5' 1.5" (1.562 m).  Weight as of this encounter: 182 lb (82.6 kg). BP 110/64   Pulse 62   Temp 98 F (36.7 C) (Temporal)   Resp 16   Ht 5' 1.5" (1.562 m)   Wt 182 lb (82.6 kg)   LMP 06/10/2010   BMI 33.83 kg/m   Wt Readings from Last 3 Encounters:  09/24/16 182 lb (82.6 kg)  06/03/16 174 lb (78.9 kg)  04/15/16 185 lb 9.6 oz (84.2 kg)   General Appearance: Obese, Well nourished well developed, in no apparent distress.  Eyes: PERRLA, EOMs, conjunctiva no swelling or erythema ENT/Mouth: Ear canals normal without obstruction, swelling, erythema, or discharge.  TMs normal bilaterally with no erythema, bulging, retraction, or loss of landmark.  Oropharynx moist and clear with no exudate, erythema, or swelling.   Neck: Supple, thyroid normal. No bruits.  No cervical adenopathy Respiratory: Respiratory effort normal, Breath sounds clear A&P without wheeze, rhonchi, rales.   Cardio: RRR without murmurs, rubs or gallops. Brisk peripheral pulses without edema.  Chest: symmetric, with normal excursions Breasts: Assymmetric secondary to surgical  changes, without lumps, nipple discharge, retractions. No axillary lymphadenopathy Abdomen: Soft, nontender, no guarding, rebound, hernias, masses, or organomegaly.  Lymphatics: Non tender without lymphadenopathy.  Musculoskeletal: Full ROM all peripheral extremities,5/5 strength, and normal gait.  Skin: Warm, dry without rashes, lesions, ecchymosis. Neuro: Awake and oriented X 3, Cranial nerves intact, reflexes equal bilaterally. Normal muscle tone, no cerebellar symptoms. Sensation intact.  Psych:  normal affect, Insight and Judgment appropriate.   EKG: WNL no changes.  Over 40 minutes of exam, counseling, chart review and critical decision making was performed  Starlyn Skeans 9:24 AM [facility]

## 2016-09-25 LAB — URINALYSIS, ROUTINE W REFLEX MICROSCOPIC
BILIRUBIN URINE: NEGATIVE
GLUCOSE, UA: NEGATIVE
Hgb urine dipstick: NEGATIVE
Ketones, ur: NEGATIVE
LEUKOCYTES UA: NEGATIVE
Nitrite: NEGATIVE
PH: 6.5 (ref 5.0–8.0)
PROTEIN: NEGATIVE
SPECIFIC GRAVITY, URINE: 1.007 (ref 1.001–1.035)

## 2016-09-25 LAB — HEMOGLOBIN A1C
HEMOGLOBIN A1C: 5.7 % — AB (ref <5.7)
MEAN PLASMA GLUCOSE: 117 mg/dL

## 2016-09-25 LAB — MICROALBUMIN / CREATININE URINE RATIO
Creatinine, Urine: 38 mg/dL (ref 20–320)
Microalb, Ur: <0.2 mg/dL

## 2016-09-25 LAB — VITAMIN D 25 HYDROXY (VIT D DEFICIENCY, FRACTURES): VIT D 25 HYDROXY: 68 ng/mL (ref 30–100)

## 2016-09-25 LAB — INSULIN, RANDOM: INSULIN: 10.3 u[IU]/mL (ref 2.0–19.6)

## 2016-09-29 DIAGNOSIS — Z01419 Encounter for gynecological examination (general) (routine) without abnormal findings: Secondary | ICD-10-CM | POA: Diagnosis not present

## 2016-09-29 DIAGNOSIS — Z6834 Body mass index (BMI) 34.0-34.9, adult: Secondary | ICD-10-CM | POA: Diagnosis not present

## 2016-09-29 DIAGNOSIS — R109 Unspecified abdominal pain: Secondary | ICD-10-CM | POA: Diagnosis not present

## 2016-10-01 ENCOUNTER — Telehealth: Payer: Self-pay | Admitting: *Deleted

## 2016-10-01 NOTE — Telephone Encounter (Signed)
Patient called and states she had a strange sensation at lunch,when she bent over to pick up her purse. She states she had a feeling in her chest like warm fluid was spreading across her chest.  She states she feels normal now except for a soreness between her breast.  Dr Melford Aase advised the patient to apply heat to the area and try Advil or Tylenol.

## 2016-10-06 DIAGNOSIS — Z1211 Encounter for screening for malignant neoplasm of colon: Secondary | ICD-10-CM | POA: Diagnosis not present

## 2016-10-06 DIAGNOSIS — K635 Polyp of colon: Secondary | ICD-10-CM | POA: Diagnosis not present

## 2016-10-06 DIAGNOSIS — K219 Gastro-esophageal reflux disease without esophagitis: Secondary | ICD-10-CM | POA: Diagnosis not present

## 2016-10-06 DIAGNOSIS — D122 Benign neoplasm of ascending colon: Secondary | ICD-10-CM | POA: Diagnosis not present

## 2016-10-06 DIAGNOSIS — D126 Benign neoplasm of colon, unspecified: Secondary | ICD-10-CM | POA: Diagnosis not present

## 2016-10-06 DIAGNOSIS — Z85038 Personal history of other malignant neoplasm of large intestine: Secondary | ICD-10-CM | POA: Diagnosis not present

## 2016-10-06 LAB — HM COLONOSCOPY

## 2016-11-02 ENCOUNTER — Encounter: Payer: Self-pay | Admitting: *Deleted

## 2016-11-26 DIAGNOSIS — R1032 Left lower quadrant pain: Secondary | ICD-10-CM | POA: Diagnosis not present

## 2016-12-01 ENCOUNTER — Other Ambulatory Visit: Payer: Self-pay | Admitting: Internal Medicine

## 2017-01-02 DIAGNOSIS — E119 Type 2 diabetes mellitus without complications: Secondary | ICD-10-CM | POA: Diagnosis not present

## 2017-01-02 DIAGNOSIS — H11001 Unspecified pterygium of right eye: Secondary | ICD-10-CM | POA: Diagnosis not present

## 2017-01-02 DIAGNOSIS — H2513 Age-related nuclear cataract, bilateral: Secondary | ICD-10-CM | POA: Diagnosis not present

## 2017-01-02 DIAGNOSIS — H04123 Dry eye syndrome of bilateral lacrimal glands: Secondary | ICD-10-CM | POA: Diagnosis not present

## 2017-01-09 ENCOUNTER — Ambulatory Visit: Payer: Self-pay | Admitting: Internal Medicine

## 2017-02-15 ENCOUNTER — Other Ambulatory Visit: Payer: Self-pay | Admitting: Physician Assistant

## 2017-02-20 ENCOUNTER — Ambulatory Visit (INDEPENDENT_AMBULATORY_CARE_PROVIDER_SITE_OTHER): Payer: BLUE CROSS/BLUE SHIELD | Admitting: Internal Medicine

## 2017-02-20 VITALS — BP 118/76 | HR 68 | Temp 97.5°F | Resp 16 | Ht 62.0 in | Wt 179.6 lb

## 2017-02-20 DIAGNOSIS — E559 Vitamin D deficiency, unspecified: Secondary | ICD-10-CM | POA: Diagnosis not present

## 2017-02-20 DIAGNOSIS — K219 Gastro-esophageal reflux disease without esophagitis: Secondary | ICD-10-CM | POA: Diagnosis not present

## 2017-02-20 DIAGNOSIS — Z79899 Other long term (current) drug therapy: Secondary | ICD-10-CM

## 2017-02-20 DIAGNOSIS — F411 Generalized anxiety disorder: Secondary | ICD-10-CM | POA: Diagnosis not present

## 2017-02-20 DIAGNOSIS — R7303 Prediabetes: Secondary | ICD-10-CM | POA: Diagnosis not present

## 2017-02-20 DIAGNOSIS — I10 Essential (primary) hypertension: Secondary | ICD-10-CM

## 2017-02-20 DIAGNOSIS — E782 Mixed hyperlipidemia: Secondary | ICD-10-CM | POA: Diagnosis not present

## 2017-02-20 LAB — CBC WITH DIFFERENTIAL/PLATELET
BASOS PCT: 1 %
Basophils Absolute: 66 cells/uL (ref 0–200)
EOS ABS: 462 {cells}/uL (ref 15–500)
Eosinophils Relative: 7 %
HEMATOCRIT: 37.2 % (ref 35.0–45.0)
HEMOGLOBIN: 12.4 g/dL (ref 11.7–15.5)
LYMPHS ABS: 1584 {cells}/uL (ref 850–3900)
LYMPHS PCT: 24 %
MCH: 29.5 pg (ref 27.0–33.0)
MCHC: 33.3 g/dL (ref 32.0–36.0)
MCV: 88.6 fL (ref 80.0–100.0)
MONO ABS: 462 {cells}/uL (ref 200–950)
MPV: 10.4 fL (ref 7.5–12.5)
Monocytes Relative: 7 %
NEUTROS ABS: 4026 {cells}/uL (ref 1500–7800)
Neutrophils Relative %: 61 %
Platelets: 226 10*3/uL (ref 140–400)
RBC: 4.2 MIL/uL (ref 3.80–5.10)
RDW: 14.2 % (ref 11.0–15.0)
WBC: 6.6 10*3/uL (ref 3.8–10.8)

## 2017-02-20 LAB — BASIC METABOLIC PANEL WITH GFR
BUN: 19 mg/dL (ref 7–25)
CHLORIDE: 107 mmol/L (ref 98–110)
CO2: 27 mmol/L (ref 20–31)
Calcium: 9.7 mg/dL (ref 8.6–10.4)
Creat: 0.99 mg/dL (ref 0.50–0.99)
GFR, EST AFRICAN AMERICAN: 71 mL/min (ref >=60)
GFR, EST NON AFRICAN AMERICAN: 62 mL/min (ref >=60)
GLUCOSE: 103 mg/dL — AB (ref 65–99)
POTASSIUM: 4.7 mmol/L (ref 3.5–5.3)
Sodium: 142 mmol/L (ref 135–146)

## 2017-02-20 LAB — TSH: TSH: 0.96 mIU/L

## 2017-02-20 LAB — HEPATIC FUNCTION PANEL
ALK PHOS: 52 U/L (ref 33–130)
ALT: 16 U/L (ref 6–29)
AST: 17 U/L (ref 10–35)
Albumin: 4.5 g/dL (ref 3.6–5.1)
BILIRUBIN INDIRECT: 0.3 mg/dL (ref 0.2–1.2)
Bilirubin, Direct: 0.1 mg/dL (ref <=0.2)
TOTAL PROTEIN: 6.9 g/dL (ref 6.1–8.1)
Total Bilirubin: 0.4 mg/dL (ref 0.2–1.2)

## 2017-02-20 LAB — LIPID PANEL
CHOL/HDL RATIO: 3.2 ratio (ref <5.0)
Cholesterol: 177 mg/dL (ref <200)
HDL: 56 mg/dL (ref >50)
LDL CALC: 111 mg/dL — AB (ref <100)
Triglycerides: 49 mg/dL (ref <150)
VLDL: 10 mg/dL (ref <30)

## 2017-02-20 LAB — HEMOGLOBIN A1C
HEMOGLOBIN A1C: 5.6 % (ref <5.7)
Mean Plasma Glucose: 114 mg/dL

## 2017-02-20 MED ORDER — OMEPRAZOLE 20 MG PO CPDR: 40.0000 mg | DELAYED_RELEASE_CAPSULE | Freq: Every day | ORAL | 3 refills | Status: DC

## 2017-02-20 NOTE — Progress Notes (Signed)
Assessment and Plan:  Hypertension:  -Continue medication,  -monitor blood pressure at home.  -Continue DASH diet.   -Reminder to go to the ER if any CP, SOB, nausea, dizziness, severe HA, changes vision/speech, left arm numbness and tingling, and jaw pain.  Cholesterol: -patient's goal is to try to stop zetia; however she likely has predisposition to hyperlipidemia -patient aware that she may need to be on lifelong.   -Continue diet and exercise.  -Check cholesterol.   Pre-diabetes: -Continue diet and exercise.  -Check A1C  Vitamin D Def: -continue medications.   GERD -cont zantac BID -omeprazole 20 mg QD x 2 weeks as needed for flares  Weight loss -body composition is actually changing and patient encouraged to continue walking and dietary changes -recommended following her success with body circumference instead  GAD -cont xanax as needed -continue exercise  Continue diet and meds as discussed. Further disposition pending results of labs.  HPI 61 y.o. female  presents for 3 month follow up with hypertension, hyperlipidemia, prediabetes and vitamin D.   Her blood pressure has been controlled at home, today their BP is BP: 118/76.   She does workout. She denies chest pain, shortness of breath, dizziness.  She is walking a lot and getting somewhere around 16,000 steps in.     She is on cholesterol medication and denies myalgias. Her cholesterol is at goal. The cholesterol last visit was:   Lab Results  Component Value Date   CHOL 186 09/24/2016   HDL 61 09/24/2016   LDLCALC 109 09/24/2016   TRIG 79 09/24/2016   CHOLHDL 3.0 09/24/2016     She has been working on diet and exercise for prediabetes, and denies foot ulcerations, hyperglycemia, hypoglycemia , increased appetite, nausea, paresthesia of the feet, polydipsia, polyuria, visual disturbances, vomiting and weight loss. Last A1C in the office was:  Lab Results  Component Value Date   HGBA1C 5.7 (H) 09/24/2016     Patient is on Vitamin D supplement.  Lab Results  Component Value Date   VD25OH 16 09/24/2016      Patient reports that she is frustrated because she is walking a lot more.  She is now walking 16,000 steps daily.  She reports that she is trying to eat healthier foods and has also been trying to cut back on carbs and sugars and she is frustrated.  She reports that she has sporadic periods in her diet.  She reports that she is just frustrated at this time.  She reports that she is trying to get additional walking in while she is there.  She is not sleeping well.  She gets up a lot to go to the bathroom at nighttime.  She reports that she can sleep hard sometimes.  She does snore per her spouse.  She reports that she does not have morning headaches.  She does not wake up gasping for air.    Current Medications:  Current Outpatient Prescriptions on File Prior to Visit  Medication Sig Dispense Refill  . ALPRAZolam (XANAX) 1 MG tablet Take 1/2 to 1 tablet 3 x day if needed for Anxiety or Sleep 90 tablet 2  . Biotin 5 MG CAPS Take by mouth.    . Cholecalciferol (VITAMIN D3) 2000 UNITS capsule Take 5,000 mg by mouth.    . Coenzyme Q10 (Q-10 CO-ENZYME PO) Take by mouth.    . ezetimibe (ZETIA) 10 MG tablet TAKE 1 TABLET BY MOUTH EVERY DAY FOR CHOLESTEROL**INS TO PAY 9/23** 90 tablet 1  .  hydrochlorothiazide (HYDRODIURIL) 25 MG tablet TAKE 1 TABLET BY MOUTH EVERY DAY FOR BLOOD PRESSURE AND FLUID 90 tablet 1  . pyridOXINE (VITAMIN B-6) 25 MG tablet Take 25 mg by mouth.    . ranitidine (ZANTAC) 300 MG tablet Take 1 to 2 tablets daily as needed for Acid Indigestion & Reflux 180 tablet PRN  . vitamin B-12 (CYANOCOBALAMIN) 100 MCG tablet Take 100 mcg by mouth.     No current facility-administered medications on file prior to visit.     Medical History:  Past Medical History:  Diagnosis Date  . Acid reflux   . Arthritis   . Cancer ([place])    Left Breast  . Complication of anesthesia    woke  up  X 1  . Fatty liver   . High cholesterol   . Hypertension   . Vitamin D deficiency     Allergies:  Allergies  Allergen Reactions  . Lipitor [Atorvastatin] Anaphylaxis and Swelling    Other reaction(s): Other (See Comments) Unknown  . Effexor [Venlafaxine]     Other reaction(s): Other (See Comments)  . Paxil [Paroxetine Hcl]     Other reaction(s): Other (See Comments)  . Wellbutrin [Bupropion]     Other reaction(s): Other (See Comments) Hives Unknown Hives     Review of Systems:  Review of Systems  Constitutional: Negative for chills, fever and malaise/fatigue.  HENT: Negative for congestion, ear pain and sore throat.   Eyes: Negative.   Respiratory: Negative for cough, shortness of breath and wheezing.   Cardiovascular: Negative for chest pain, palpitations and leg swelling.  Gastrointestinal: Negative for abdominal pain, blood in stool, constipation, diarrhea, heartburn and melena.  Genitourinary: Negative.   Skin: Negative.   Neurological: Negative for dizziness, sensory change, loss of consciousness and headaches.  Psychiatric/Behavioral: Negative for depression. The patient has insomnia. The patient is not nervous/anxious.     Family history- Review and unchanged  Social history- Review and unchanged  Physical Exam: BP 118/76   Pulse 68   Temp 97.5 F (36.4 C)   Resp 16   Ht 5\' 2"  (1.575 m)   Wt 179 lb 9.6 oz (81.5 kg)   LMP 06/10/2010   BMI 32.85 kg/m  Wt Readings from Last 3 Encounters:  02/20/17 179 lb 9.6 oz (81.5 kg)  09/24/16 182 lb (82.6 kg)  06/03/16 174 lb (78.9 kg)    General Appearance: Well nourished well developed, in no apparent distress. Eyes: PERRLA, EOMs, conjunctiva no swelling or erythema ENT/Mouth: Ear canals normal without obstruction, swelling, erythma, discharge.  TMs normal bilaterally.  Oropharynx moist, clear, without exudate, or postoropharyngeal swelling. Neck: Supple, thyroid normal,no cervical adenopathy  Respiratory:  Respiratory effort normal, Breath sounds clear A&P without rhonchi, wheeze, or rale.  No retractions, no accessory usage. Cardio: RRR with no MRGs. Brisk peripheral pulses without edema.  Abdomen: Soft, + BS,  Non tender, no guarding, rebound, hernias, masses. Musculoskeletal: Full ROM, 5/5 strength, Normal gait Skin: Warm, dry without rashes, lesions, ecchymosis.  Neuro: Awake and oriented X 3, Cranial nerves intact. Normal muscle tone, no cerebellar symptoms. Psych: Normal affect, Insight and Judgment appropriate.    Starlyn Skeans, PA-C 9:03 AM [facility]

## 2017-02-23 ENCOUNTER — Other Ambulatory Visit: Payer: Self-pay | Admitting: Internal Medicine

## 2017-02-23 ENCOUNTER — Encounter: Payer: Self-pay | Admitting: Internal Medicine

## 2017-02-23 ENCOUNTER — Telehealth: Payer: Self-pay | Admitting: Internal Medicine

## 2017-02-23 MED ORDER — PREDNISONE 20 MG PO TABS: ORAL_TABLET | ORAL | 0 refills | Status: DC

## 2017-02-23 MED ORDER — PROMETHAZINE-DM 6.25-15 MG/5ML PO SYRP: ORAL_SOLUTION | ORAL | 0 refills | Status: DC

## 2017-02-23 MED ORDER — FLUTICASONE PROPIONATE 50 MCG/ACT NA SUSP: 2.0000 | Freq: Every day | NASAL | 0 refills | Status: DC

## 2017-02-23 MED ORDER — AZITHROMYCIN 250 MG PO TABS: ORAL_TABLET | ORAL | 0 refills | Status: DC

## 2017-02-23 NOTE — Telephone Encounter (Signed)
Replied via mychart.

## 2017-02-23 NOTE — Telephone Encounter (Signed)
Patient just seen in office, now coughing, nose dry, hurting, ear feels full, patient requests rx to [facility]

## 2017-02-27 ENCOUNTER — Other Ambulatory Visit: Payer: Self-pay | Admitting: *Deleted

## 2017-02-27 MED ORDER — PROMETHAZINE-CODEINE 6.25-10 MG/5ML PO SYRP: 5.0000 mL | ORAL_SOLUTION | Freq: Three times a day (TID) | ORAL | 0 refills | Status: DC | PRN

## 2017-03-07 DIAGNOSIS — J0100 Acute maxillary sinusitis, unspecified: Secondary | ICD-10-CM | POA: Diagnosis not present

## 2017-03-07 DIAGNOSIS — R05 Cough: Secondary | ICD-10-CM | POA: Diagnosis not present

## 2017-03-07 DIAGNOSIS — J40 Bronchitis, not specified as acute or chronic: Secondary | ICD-10-CM | POA: Diagnosis not present

## 2017-03-10 ENCOUNTER — Other Ambulatory Visit: Payer: Self-pay | Admitting: Internal Medicine

## 2017-03-10 ENCOUNTER — Ambulatory Visit (HOSPITAL_COMMUNITY)
Admission: RE | Admit: 2017-03-10 | Discharge: 2017-03-10 | Disposition: A | Discharge status: Home / Self Care | Payer: BLUE CROSS/BLUE SHIELD | Source: Ambulatory Visit | Attending: Internal Medicine | Admitting: Internal Medicine

## 2017-03-10 DIAGNOSIS — R059 Cough, unspecified: Secondary | ICD-10-CM

## 2017-03-10 DIAGNOSIS — R05 Cough: Secondary | ICD-10-CM | POA: Diagnosis not present

## 2017-03-12 ENCOUNTER — Encounter: Payer: Self-pay | Admitting: Physician Assistant

## 2017-03-21 ENCOUNTER — Other Ambulatory Visit: Payer: Self-pay | Admitting: Internal Medicine

## 2017-03-30 ENCOUNTER — Encounter: Payer: Self-pay | Admitting: *Deleted

## 2017-04-13 ENCOUNTER — Other Ambulatory Visit: Payer: Self-pay | Admitting: Internal Medicine

## 2017-06-02 ENCOUNTER — Ambulatory Visit: Payer: Self-pay | Admitting: Internal Medicine

## 2017-07-11 DIAGNOSIS — W57XXXA Bitten or stung by nonvenomous insect and other nonvenomous arthropods, initial encounter: Secondary | ICD-10-CM | POA: Diagnosis not present

## 2017-07-11 DIAGNOSIS — S20469A Insect bite (nonvenomous) of unspecified back wall of thorax, initial encounter: Secondary | ICD-10-CM | POA: Diagnosis not present

## 2017-07-13 ENCOUNTER — Telehealth: Payer: Self-pay | Admitting: *Deleted

## 2017-07-13 NOTE — Telephone Encounter (Signed)
Patient called to inform Dr Melford Aase she was seen at a [facility] on 07/11/2017 for a bug bite(notes are in EPIC). The patient was told and inform our office.  The patient is treating the bite site and states it is healing.  She was concerned about a possible infection/parasite and was told at urgent care it was too early to check lab work on 07/11/2017. The patient will call back if she develops any symptoms.

## 2017-08-17 ENCOUNTER — Encounter: Payer: Self-pay | Admitting: *Deleted

## 2017-08-20 ENCOUNTER — Telehealth: Payer: Self-pay | Admitting: *Deleted

## 2017-08-20 ENCOUNTER — Ambulatory Visit: Payer: Self-pay | Admitting: Physician Assistant

## 2017-08-20 ENCOUNTER — Other Ambulatory Visit: Payer: Self-pay | Admitting: Internal Medicine

## 2017-08-20 NOTE — Telephone Encounter (Signed)
Patient called about concern about a bug bite in 06/2017, which was evaluated at Urgent care. She was bitten and was told she may need blood tests, if she has symptoms.  Per the patient, she feels fine and per Dr Melford Aase, due to the rare possibility of developing symptoms from the bite, it is OK to wait until her 09/29/2017 CPE to discuss the issue.  The patient is aware and will call back PRN>

## 2017-08-21 ENCOUNTER — Other Ambulatory Visit: Payer: Self-pay | Admitting: Internal Medicine

## 2017-08-25 ENCOUNTER — Other Ambulatory Visit: Payer: Self-pay | Admitting: Obstetrics and Gynecology

## 2017-08-25 DIAGNOSIS — Z1231 Encounter for screening mammogram for malignant neoplasm of breast: Secondary | ICD-10-CM

## 2017-09-14 ENCOUNTER — Encounter: Payer: Self-pay | Admitting: Physician Assistant

## 2017-09-14 ENCOUNTER — Ambulatory Visit
Admission: RE | Admit: 2017-09-14 | Discharge: 2017-09-14 | Disposition: A | Discharge status: Home / Self Care | Payer: BLUE CROSS/BLUE SHIELD | Source: Ambulatory Visit | Attending: Obstetrics and Gynecology | Admitting: Obstetrics and Gynecology

## 2017-09-14 DIAGNOSIS — Z1231 Encounter for screening mammogram for malignant neoplasm of breast: Secondary | ICD-10-CM

## 2017-09-14 DIAGNOSIS — R52 Pain, unspecified: Secondary | ICD-10-CM | POA: Diagnosis not present

## 2017-09-14 DIAGNOSIS — J069 Acute upper respiratory infection, unspecified: Secondary | ICD-10-CM | POA: Diagnosis not present

## 2017-09-14 DIAGNOSIS — H66002 Acute suppurative otitis media without spontaneous rupture of ear drum, left ear: Secondary | ICD-10-CM | POA: Diagnosis not present

## 2017-09-15 ENCOUNTER — Other Ambulatory Visit: Payer: Self-pay | Admitting: Internal Medicine

## 2017-09-15 ENCOUNTER — Ambulatory Visit: Payer: Self-pay | Admitting: Internal Medicine

## 2017-09-15 MED ORDER — PROMETHAZINE-DM 6.25-15 MG/5ML PO SYRP: ORAL_SOLUTION | ORAL | 0 refills | Status: DC

## 2017-09-25 ENCOUNTER — Encounter: Payer: Self-pay | Admitting: Internal Medicine

## 2017-09-25 NOTE — Progress Notes (Signed)
Patient ID: Jenna Miller, female   DOB: January 15, 1956, 61 y.o.   MRN: [id number] Complete physical  Assessment and Plan:  Hypertension:  -Continue medication,  -monitor blood pressure at home.  -Continue DASH diet.   -Reminder to go to the ER if any CP, SOB, nausea, dizziness, severe HA, changes vision/speech, left arm numbness and tingling, and jaw pain.  Cholesterol: -Continue diet and exercise.  -Check cholesterol.   Pre-diabetes: -Continue diet and exercise.  -Check A1C  Vitamin D Def: -check level -continue medications.   Obesity with co morbidities - long discussion about weight loss, diet, and exercise  Needs flu shot -     Flu vaccine HIGH DOSE PF  Cough -     promethazine-dextromethorphan (PROMETHAZINE-DM) 6.25-15 MG/5ML syrup; Take 5 mLs by mouth 4 (four) times daily as needed for cough.  Gastroesophageal reflux disease, esophagitis presence not specified Continue PPI/H2 blocker, diet discussed  Fatty liver Check labs, avoid tylenol, alcohol, weight loss advised.   CKD Stage 2 (GFR 69 ml/min) -     BASIC METABOLIC PANEL WITH GFR - increase water  Vitamin D deficiency -     VITAMIN D 25 Hydroxy (Vit-D Deficiency, Fractures)  Malignant neoplasm of left female breast, unspecified estrogen receptor status, unspecified site of breast (HCC) monitored  Continue diet and meds as discussed. Further disposition pending results of labs.  HPI 61 y.o. female  presents for CPE and 3 month follow up with hypertension, hyperlipidemia, prediabetes and vitamin D.   Her blood pressure has been controlled at home, today their BP is BP: 122/78.   She does workout, has been walking, trying to get 6000 steps a day and she has a new stand up desk at work. She denies chest pain, shortness of breath, dizziness.  She had sinus infection, saw [facility], still has cough but feels better.  She has hypertensive CKD stage 2 Lab Results  Component Value Date   GFRNONAA 62 02/20/2017     She is on cholesterol medication, zetia and denies myalgias. Her cholesterol is at goal. The cholesterol last visit was:   Lab Results  Component Value Date   CHOL 177 02/20/2017   HDL 56 02/20/2017   LDLCALC 111 (H) 02/20/2017   TRIG 49 02/20/2017   CHOLHDL 3.2 02/20/2017    She has been working on diet and exercise for prediabetes, and denies foot ulcerations, hyperglycemia, hypoglycemia , increased appetite, nausea, paresthesia of the feet, polydipsia, polyuria, visual disturbances, vomiting and weight loss. Last A1C in the office was:  Lab Results  Component Value Date   HGBA1C 5.6 02/20/2017   Patient is on Vitamin D supplement.  Lab Results  Component Value Date   VD25OH 68 09/24/2016     BMI is Body mass index is 32.58 kg/m., she is working on diet and exercise. Wt Readings from Last 3 Encounters:  09/29/17 181 lb (82.1 kg)  02/20/17 179 lb 9.6 oz (81.5 kg)  09/24/16 182 lb (82.6 kg)     Current Medications:  Current Outpatient Prescriptions on File Prior to Visit  Medication Sig Dispense Refill  . Biotin 5 MG CAPS Take by mouth.    . Cholecalciferol (VITAMIN D3) 2000 UNITS capsule Take 5,000 mg by mouth.    . Coenzyme Q10 (Q-10 CO-ENZYME PO) Take by mouth.    . ezetimibe (ZETIA) 10 MG tablet TAKE 1 TABLET BY MOUTH EVERY DAY FOR CHOLESTEROL**INS TO PAY 9/23** 90 tablet 1  . fluticasone (FLONASE) 50 MCG/ACT nasal spray PLACE  2 SPRAYS INTO BOTH NOSTRILS DAILY. 48 g 1  . gemfibrozil (LOPID) 600 MG tablet TAKE 1 TABLET TWICE A DAY WITH FOOD FOR CHOLESTEROL 180 tablet 2  . hydrochlorothiazide (HYDRODIURIL) 25 MG tablet TAKE 1 TABLET BY MOUTH EVERY DAY FOR BLOOD PRESSURE AND FLUID 90 tablet 1  . omeprazole (PRILOSEC) 20 MG capsule Take 2 capsules (40 mg total) by mouth daily. 30 capsule 3  . pyridOXINE (VITAMIN B-6) 25 MG tablet Take 25 mg by mouth.    . vitamin B-12 (CYANOCOBALAMIN) 100 MCG tablet Take 100 mcg by mouth.    . ALPRAZolam (XANAX) 1 MG tablet Take 1/2 to 1  tablet 3 x day if needed for Anxiety or Sleep (Patient not taking: Reported on 09/29/2017) 90 tablet 2  . ranitidine (ZANTAC) 300 MG tablet Take 1 to 2 tablets daily as needed for Acid Indigestion & Reflux 180 tablet PRN   No current facility-administered medications on file prior to visit.     Medical History:  Past Medical History:  Diagnosis Date  . Acid reflux   . Arthritis   . Cancer ([place])    Left Breast  . Complication of anesthesia    woke  up X 1  . Fatty liver   . High cholesterol   . Hypertension   . Vitamin D deficiency    Immunization History  Administered Date(s) Administered  . Influenza Split 08/29/2013, 10/16/2015  . Influenza, Seasonal, Injecte, Preservative Fre 11/02/2014  . Influenza,inj,quad, With Preservative 09/24/2016  . PPD Test 03/14/2014, 07/11/2015  . Pneumococcal Polysaccharide-23 05/22/2003  . Td 05/28/2005  . Tdap 07/11/2015  . Zoster 02/26/2016    Tetanus: 2016 Pneumovax: 2004 Flu vaccine: 2016 Zostavax: 2017 MGM: 09/14/2017 Colonoscopy: 2012 CT AB 2014 CXR 2018 DEXA 2007 PAP 2016 declines another  Patient Care Team: Unk Pinto, MD as PCP - General (Internal Medicine)  Allergies Allergies  Allergen Reactions  . Lipitor [Atorvastatin] Anaphylaxis and Swelling    Other reaction(s): Other (See Comments) Unknown  . Effexor [Venlafaxine]     Other reaction(s): Other (See Comments)  . Paxil [Paroxetine Hcl]     Other reaction(s): Other (See Comments)  . Wellbutrin [Bupropion]     Other reaction(s): Other (See Comments) Hives Unknown Hives    SURGICAL HISTORY She  has a past surgical history that includes TRAM (1999-2000); Knee arthroscopy (1982); Wisdom tooth extraction; Cholecystectomy (11/19/2012); Breast surgery (1995); Hernia repair; and Mastectomy (Left, 1995). FAMILY HISTORY Her family history includes Cancer in her cousin; Hyperlipidemia in her brother and mother; Hypertension in her brother and mother. SOCIAL  HISTORY She  reports that she has never smoked. She does not have any smokeless tobacco history on file. She reports that she drinks alcohol. She reports that she does not use drugs.  Review of Systems:  Review of Systems  Constitutional: Negative for chills, fever and malaise/fatigue.  HENT: Negative for congestion, ear pain and sore throat.   Respiratory: Negative for cough, shortness of breath and wheezing.   Cardiovascular: Negative for chest pain, palpitations and leg swelling.  Gastrointestinal: Negative for blood in stool, constipation, diarrhea, heartburn, melena and nausea.  Genitourinary: Negative.   Neurological: Negative for dizziness, sensory change, loss of consciousness and headaches.  Psychiatric/Behavioral: Negative for depression. The patient is not nervous/anxious and does not have insomnia.     Physical Exam: BP 122/78   Pulse 86   Temp (!) 96.4 F (35.8 C)   Resp 15   Ht 5' 2.5" (1.588 m)  Wt 181 lb (82.1 kg)   LMP 06/10/2010   SpO2 95%   BMI 32.58 kg/m  Wt Readings from Last 3 Encounters:  09/29/17 181 lb (82.1 kg)  02/20/17 179 lb 9.6 oz (81.5 kg)  09/24/16 182 lb (82.6 kg)    General Appearance: Well nourished well developed, in no apparent distress. Eyes: PERRLA, EOMs, conjunctiva no swelling or erythema ENT/Mouth: Ear canals normal without obstruction, swelling, erythma, discharge.  TMs normal bilaterally.  Oropharynx moist, clear, without exudate, or postoropharyngeal swelling. Neck: Supple, thyroid normal,no cervical adenopathy  Respiratory: Respiratory effort normal, Breath sounds clear A&P without rhonchi, wheeze, or rale.  No retractions, no accessory usage. Cardio: RRR with no MRGs. Brisk peripheral pulses without edema.  Abdomen: Soft, + BS,  Non tender, no guarding, rebound, hernias, masses. Musculoskeletal: Full ROM, 5/5 strength, Normal gait Skin: Warm, dry without rashes, lesions, ecchymosis.  Neuro: Awake and oriented X 3, Cranial  nerves intact. Normal muscle tone, no cerebellar symptoms. Psych: Normal affect, Insight and Judgment appropriate.   EKG WNL  Vicie Mutters, PA-C 10:35 AM [facility]

## 2017-09-29 ENCOUNTER — Ambulatory Visit (INDEPENDENT_AMBULATORY_CARE_PROVIDER_SITE_OTHER): Payer: BLUE CROSS/BLUE SHIELD | Admitting: Physician Assistant

## 2017-09-29 VITALS — BP 122/78 | HR 86 | Temp 96.4°F | Resp 15 | Ht 62.5 in | Wt 181.0 lb

## 2017-09-29 DIAGNOSIS — Z23 Encounter for immunization: Secondary | ICD-10-CM | POA: Diagnosis not present

## 2017-09-29 DIAGNOSIS — Z0000 Encounter for general adult medical examination without abnormal findings: Secondary | ICD-10-CM | POA: Diagnosis not present

## 2017-09-29 DIAGNOSIS — I10 Essential (primary) hypertension: Secondary | ICD-10-CM | POA: Diagnosis not present

## 2017-09-29 DIAGNOSIS — Z79899 Other long term (current) drug therapy: Secondary | ICD-10-CM | POA: Diagnosis not present

## 2017-09-29 DIAGNOSIS — N182 Chronic kidney disease, stage 2 (mild): Secondary | ICD-10-CM

## 2017-09-29 DIAGNOSIS — E559 Vitamin D deficiency, unspecified: Secondary | ICD-10-CM | POA: Diagnosis not present

## 2017-09-29 DIAGNOSIS — R05 Cough: Secondary | ICD-10-CM

## 2017-09-29 DIAGNOSIS — Z136 Encounter for screening for cardiovascular disorders: Secondary | ICD-10-CM | POA: Diagnosis not present

## 2017-09-29 DIAGNOSIS — E782 Mixed hyperlipidemia: Secondary | ICD-10-CM

## 2017-09-29 DIAGNOSIS — K219 Gastro-esophageal reflux disease without esophagitis: Secondary | ICD-10-CM

## 2017-09-29 DIAGNOSIS — R7303 Prediabetes: Secondary | ICD-10-CM

## 2017-09-29 DIAGNOSIS — C50912 Malignant neoplasm of unspecified site of left female breast: Secondary | ICD-10-CM

## 2017-09-29 DIAGNOSIS — K760 Fatty (change of) liver, not elsewhere classified: Secondary | ICD-10-CM

## 2017-09-29 DIAGNOSIS — R059 Cough, unspecified: Secondary | ICD-10-CM

## 2017-09-29 MED ORDER — PROMETHAZINE-DM 6.25-15 MG/5ML PO SYRP: 5.0000 mL | ORAL_SOLUTION | Freq: Four times a day (QID) | ORAL | 1 refills | Status: DC | PRN

## 2017-09-29 NOTE — Patient Instructions (Addendum)
Try to avoid cheese Continue to increase veggies and walking Stick closer to 1100-1200  Can send to dietician   Simple math prevails.    1st - exercise does not produce significant weight loss - at best one converts fat into muscle , "bulks up", loses inches, but usually stays "weight neutral"     2nd - think of your body weightas a check book: If you eat more calories than you burn up - you save money or gain weight .... Or if you spend more money than you put in the check book, ie burn up more calories than you eat, then you lose weight     3rd - if you walk or run 1 mile, you burn up 100 calories - you have to burn up 3,500 calories to lose 1 pound, ie you have to walk/run 35 miles to lose 1 measly pound. So if you want to lose 10 #, then you have to walk/run 350 miles, so.... clearly exercise is not the solution.     4. So if you consume 1,500 calories, then you have to burn up the equivalent of 15 miles to stay weight neutral - It also stands to reason that if you consume 1,500 cal/day and don't lose weight, then you must be burning up about 1,500 cals/day to stay weight neutral.     5. If you really want to lose weight, you must cut your calorie intake 300 calories /day and at that rate you should lose about 1 # every 3 days.   6. Please purchase Dr Fara Olden Fuhrman's book(s) "The End of Dieting" & "Eat to Live" . It has some great concepts and recipes.

## 2017-09-30 LAB — LIPID PANEL
Cholesterol: 191 mg/dL (ref <200)
HDL: 60 mg/dL (ref >50)
LDL Cholesterol (Calc): 111 mg/dL (calc) — ABNORMAL HIGH
NON-HDL CHOLESTEROL (CALC): 131 mg/dL — AB (ref <130)
Total CHOL/HDL Ratio: 3.2 (calc) (ref <5.0)
Triglycerides: 92 mg/dL (ref <150)

## 2017-09-30 LAB — HEPATIC FUNCTION PANEL
AG Ratio: 2.1 (calc) (ref 1.0–2.5)
ALKALINE PHOSPHATASE (APISO): 50 U/L (ref 33–130)
ALT: 18 U/L (ref 6–29)
AST: 18 U/L (ref 10–35)
Albumin: 4.4 g/dL (ref 3.6–5.1)
BILIRUBIN DIRECT: 0.1 mg/dL (ref 0.0–0.2)
BILIRUBIN TOTAL: 0.4 mg/dL (ref 0.2–1.2)
Globulin: 2.1 g/dL (calc) (ref 1.9–3.7)
Indirect Bilirubin: 0.3 mg/dL (calc) (ref 0.2–1.2)
Total Protein: 6.5 g/dL (ref 6.1–8.1)

## 2017-09-30 LAB — VITAMIN D 25 HYDROXY (VIT D DEFICIENCY, FRACTURES): Vit D, 25-Hydroxy: 88 ng/mL (ref 30–100)

## 2017-09-30 LAB — CBC WITH DIFFERENTIAL/PLATELET
BASOS ABS: 52 {cells}/uL (ref 0–200)
Basophils Relative: 0.8 %
Eosinophils Absolute: 137 cells/uL (ref 15–500)
Eosinophils Relative: 2.1 %
HEMATOCRIT: 36.1 % (ref 35.0–45.0)
Hemoglobin: 12.3 g/dL (ref 11.7–15.5)
LYMPHS ABS: 2191 {cells}/uL (ref 850–3900)
MCH: 30.1 pg (ref 27.0–33.0)
MCHC: 34.1 g/dL (ref 32.0–36.0)
MCV: 88.3 fL (ref 80.0–100.0)
MPV: 11.5 fL (ref 7.5–12.5)
Monocytes Relative: 8 %
NEUTROS PCT: 55.4 %
Neutro Abs: 3601 cells/uL (ref 1500–7800)
Platelets: 223 10*3/uL (ref 140–400)
RBC: 4.09 10*6/uL (ref 3.80–5.10)
RDW: 13.1 % (ref 11.0–15.0)
Total Lymphocyte: 33.7 %
WBC: 6.5 10*3/uL (ref 3.8–10.8)
WBCMIX: 520 {cells}/uL (ref 200–950)

## 2017-09-30 LAB — URINALYSIS, ROUTINE W REFLEX MICROSCOPIC
BILIRUBIN URINE: NEGATIVE
Glucose, UA: NEGATIVE
Hgb urine dipstick: NEGATIVE
Hyaline Cast: NONE SEEN /LPF
KETONES UR: NEGATIVE
NITRITE: NEGATIVE
PH: 6 (ref 5.0–8.0)
Protein, ur: NEGATIVE
RBC / HPF: NONE SEEN /HPF (ref 0–2)
SQUAMOUS EPITHELIAL / LPF: NONE SEEN /HPF (ref <=5)
Specific Gravity, Urine: 1.003 (ref 1.001–1.03)
WBC UA: NONE SEEN /HPF (ref 0–5)

## 2017-09-30 LAB — MICROALBUMIN / CREATININE URINE RATIO
Creatinine, Urine: 18 mg/dL — ABNORMAL LOW (ref 20–275)
MICROALB UR: 0.4 mg/dL
MICROALB/CREAT RATIO: 22 ug/mg{creat} (ref <30)

## 2017-09-30 LAB — BASIC METABOLIC PANEL WITH GFR
BUN: 16 mg/dL (ref 7–25)
CO2: 24 mmol/L (ref 20–32)
Calcium: 9.5 mg/dL (ref 8.6–10.4)
Chloride: 105 mmol/L (ref 98–110)
Creat: 0.82 mg/dL (ref 0.50–0.99)
GFR, EST AFRICAN AMERICAN: 89 mL/min/{1.73_m2} (ref >=60)
GFR, EST NON AFRICAN AMERICAN: 77 mL/min/{1.73_m2} (ref >=60)
Glucose, Bld: 92 mg/dL (ref 65–99)
Potassium: 4.2 mmol/L (ref 3.5–5.3)
SODIUM: 140 mmol/L (ref 135–146)

## 2017-09-30 LAB — MAGNESIUM: MAGNESIUM: 2 mg/dL (ref 1.5–2.5)

## 2017-09-30 LAB — HEMOGLOBIN A1C
HEMOGLOBIN A1C: 5.6 %{Hb} (ref <5.7)
MEAN PLASMA GLUCOSE: 114 (calc)
eAG (mmol/L): 6.3 (calc)

## 2017-09-30 LAB — TSH: TSH: 1.22 mIU/L (ref 0.40–4.50)

## 2017-10-07 ENCOUNTER — Encounter: Payer: Self-pay | Admitting: Physician Assistant

## 2017-10-14 ENCOUNTER — Encounter: Payer: Self-pay | Admitting: Internal Medicine

## 2017-10-26 DIAGNOSIS — Z6833 Body mass index (BMI) 33.0-33.9, adult: Secondary | ICD-10-CM | POA: Diagnosis not present

## 2017-10-26 DIAGNOSIS — Z01419 Encounter for gynecological examination (general) (routine) without abnormal findings: Secondary | ICD-10-CM | POA: Diagnosis not present

## 2018-01-07 ENCOUNTER — Encounter: Payer: Self-pay | Admitting: *Deleted

## 2018-01-07 DIAGNOSIS — H25013 Cortical age-related cataract, bilateral: Secondary | ICD-10-CM | POA: Diagnosis not present

## 2018-01-07 DIAGNOSIS — H2513 Age-related nuclear cataract, bilateral: Secondary | ICD-10-CM | POA: Diagnosis not present

## 2018-01-07 DIAGNOSIS — H524 Presbyopia: Secondary | ICD-10-CM | POA: Diagnosis not present

## 2018-01-07 DIAGNOSIS — H35363 Drusen (degenerative) of macula, bilateral: Secondary | ICD-10-CM | POA: Diagnosis not present

## 2018-01-07 LAB — HM DIABETES EYE EXAM

## 2018-01-13 ENCOUNTER — Encounter: Payer: Self-pay | Admitting: *Deleted

## 2018-01-26 DIAGNOSIS — K5900 Constipation, unspecified: Secondary | ICD-10-CM | POA: Diagnosis not present

## 2018-02-08 ENCOUNTER — Encounter: Payer: Self-pay | Admitting: *Deleted

## 2018-02-14 ENCOUNTER — Other Ambulatory Visit: Payer: Self-pay | Admitting: Internal Medicine

## 2018-03-26 DIAGNOSIS — R05 Cough: Secondary | ICD-10-CM | POA: Diagnosis not present

## 2018-03-26 DIAGNOSIS — J029 Acute pharyngitis, unspecified: Secondary | ICD-10-CM | POA: Diagnosis not present

## 2018-03-26 DIAGNOSIS — J069 Acute upper respiratory infection, unspecified: Secondary | ICD-10-CM | POA: Diagnosis not present

## 2018-03-31 ENCOUNTER — Encounter: Payer: Self-pay | Admitting: Physician Assistant

## 2018-03-31 ENCOUNTER — Ambulatory Visit: Payer: BLUE CROSS/BLUE SHIELD | Admitting: Physician Assistant

## 2018-03-31 VITALS — BP 124/74 | HR 68 | Temp 97.7°F | Resp 16 | Ht 62.5 in | Wt 181.4 lb

## 2018-03-31 DIAGNOSIS — Z79899 Other long term (current) drug therapy: Secondary | ICD-10-CM | POA: Diagnosis not present

## 2018-03-31 DIAGNOSIS — N182 Chronic kidney disease, stage 2 (mild): Secondary | ICD-10-CM

## 2018-03-31 DIAGNOSIS — J0100 Acute maxillary sinusitis, unspecified: Secondary | ICD-10-CM | POA: Diagnosis not present

## 2018-03-31 DIAGNOSIS — I10 Essential (primary) hypertension: Secondary | ICD-10-CM

## 2018-03-31 DIAGNOSIS — E782 Mixed hyperlipidemia: Secondary | ICD-10-CM

## 2018-03-31 LAB — BASIC METABOLIC PANEL WITH GFR
BUN: 22 mg/dL (ref 7–25)
CALCIUM: 10.1 mg/dL (ref 8.6–10.4)
CHLORIDE: 102 mmol/L (ref 98–110)
CO2: 28 mmol/L (ref 20–32)
Creat: 0.88 mg/dL (ref 0.50–0.99)
GFR, EST NON AFRICAN AMERICAN: 70 mL/min/{1.73_m2} (ref >=60)
GFR, Est African American: 81 mL/min/{1.73_m2} (ref >=60)
Glucose, Bld: 109 mg/dL — ABNORMAL HIGH (ref 65–99)
POTASSIUM: 4.4 mmol/L (ref 3.5–5.3)
Sodium: 139 mmol/L (ref 135–146)

## 2018-03-31 LAB — LIPID PANEL
Cholesterol: 214 mg/dL — ABNORMAL HIGH (ref <200)
HDL: 65 mg/dL (ref >50)
LDL Cholesterol (Calc): 131 mg/dL (calc) — ABNORMAL HIGH
Non-HDL Cholesterol (Calc): 149 mg/dL (calc) — ABNORMAL HIGH (ref <130)
TRIGLYCERIDES: 83 mg/dL (ref <150)
Total CHOL/HDL Ratio: 3.3 (calc) (ref <5.0)

## 2018-03-31 LAB — CBC WITH DIFFERENTIAL/PLATELET
Basophils Absolute: 41 cells/uL (ref 0–200)
Basophils Relative: 0.8 %
EOS PCT: 3 %
Eosinophils Absolute: 153 cells/uL (ref 15–500)
HEMATOCRIT: 37.3 % (ref 35.0–45.0)
Hemoglobin: 12.7 g/dL (ref 11.7–15.5)
LYMPHS ABS: 1607 {cells}/uL (ref 850–3900)
MCH: 29.8 pg (ref 27.0–33.0)
MCHC: 34 g/dL (ref 32.0–36.0)
MCV: 87.6 fL (ref 80.0–100.0)
MPV: 10.9 fL (ref 7.5–12.5)
Monocytes Relative: 8.3 %
NEUTROS ABS: 2876 {cells}/uL (ref 1500–7800)
NEUTROS PCT: 56.4 %
Platelets: 232 10*3/uL (ref 140–400)
RBC: 4.26 10*6/uL (ref 3.80–5.10)
RDW: 13 % (ref 11.0–15.0)
Total Lymphocyte: 31.5 %
WBC mixed population: 423 cells/uL (ref 200–950)
WBC: 5.1 10*3/uL (ref 3.8–10.8)

## 2018-03-31 LAB — HEPATIC FUNCTION PANEL
AG Ratio: 2 (calc) (ref 1.0–2.5)
ALBUMIN MSPROF: 4.6 g/dL (ref 3.6–5.1)
ALT: 18 U/L (ref 6–29)
AST: 18 U/L (ref 10–35)
Alkaline phosphatase (APISO): 55 U/L (ref 33–130)
BILIRUBIN TOTAL: 0.5 mg/dL (ref 0.2–1.2)
Bilirubin, Direct: 0.1 mg/dL (ref 0.0–0.2)
Globulin: 2.3 g/dL (calc) (ref 1.9–3.7)
Indirect Bilirubin: 0.4 mg/dL (calc) (ref 0.2–1.2)
Total Protein: 6.9 g/dL (ref 6.1–8.1)

## 2018-03-31 LAB — TSH: TSH: 1.54 m[IU]/L (ref 0.40–4.50)

## 2018-03-31 LAB — MAGNESIUM: Magnesium: 2 mg/dL (ref 1.5–2.5)

## 2018-03-31 MED ORDER — PROMETHAZINE-DM 6.25-15 MG/5ML PO SYRP: 5.0000 mL | ORAL_SOLUTION | Freq: Four times a day (QID) | ORAL | 1 refills | Status: DC | PRN

## 2018-03-31 MED ORDER — FLUTICASONE PROPIONATE 50 MCG/ACT NA SUSP: 2.0000 | Freq: Every day | NASAL | 1 refills | Status: DC

## 2018-03-31 MED ORDER — AZITHROMYCIN 250 MG PO TABS: ORAL_TABLET | ORAL | 1 refills | Status: AC

## 2018-03-31 NOTE — Patient Instructions (Signed)
Check out  Mini habits for weight loss book  Veggies are great because you can eat a ton! They are low in calories, great to fill you up, and have a ton of vitamins, minerals, and protein.      8 Critical Weight-Loss Tips That Aren't Diet and Exercise  1. STARVE THE DISTRACTIONS  All too often when we eat, we're also multitasking: watching TV, answering emails, scrolling through social media. These habits are detrimental to having a strong, clear, healthy relationship with food, and they can hinder our ability to make dietary changes.  In order to truly focus on what you're eating, how much you're eating, why you're eating those specific foods and, most importantly, how those foods make you feel, you need to starve the distractions. That means when you eat, just eat. Focus on your food, the process it went through to end up on your plate, where it came from and how it nourishes you. With this technique, you're more likely to finish a meal feeling satiated.  2.  CONSIDER WHAT YOU'RE NOT WILLING TO DO  This might sound counterintuitive, but it can help provide a "why" when motivation is waning. Declare, in writing, what you are unwilling to do, for example "I am unwilling to be the old dad who cannot play sports with my children".  So consider what you're not willing to accept, write it down, and keep it at the ready.  3.  STOP LABELING FOOD "GOOD" AND "BAD"  You've probably heard someone say they ate something "bad." Maybe you've even said it yourself.  The trouble with 'bad' foods isn't that they'll send you to the grave after a bite or two. The trouble comes when we eat excessive portions of really calorie-dense foods meal after meal, day after day.  Instead of labeling foods as good or bad, think about which foods you can eat a lot of, and which ones you should just eat a little of. Then, plan ways to eat the foods you really like in portions that fit with your overall goals. A good  example of this would be having a slice of pizza alongside a club salad with chicken breast, avocado and a bit of dressing. This is vastly different than 3 slices of pizza, 4 breadsticks with cheese sauce and half of a liter of regular soda.  4.  BRUSH YOUR TEETH AFTER YOU EAT  Getting your mindset in order is important, but sometimes small habits can make a big difference. After eating, you still have the taste of food in their mouth, which often causes people to eat more even if they are full or engage in a nibble or two of dessert.  Brushing your teeth will remove the taste of food from your mouth, and the clean, minty freshness will serve as a cue that mealtime is over.  5.  FOCUS ON CROWDING NOT CUTTING  The most common first step during 'dieting' is to cut. We cut our portion sizes down, we cut out 'bad' foods, we cut out entire food groups. This act of cutting puts [place] and our minds into scarcity mode.  When something is off-limits, even if you're able to avoid it for a while, you could end up bingeing on it later because you've gone so long without it. So, instead of cutting, focus on crowding. If you crowd your plate and fill it up with more foods like veggies and protein, it simply allows less room for the other stuff. In  other words, shift your focus away from what you can't eat, and celebrate the foods that will help you reach your goals.  6.  TAKE TRACKING A STEP FURTHER  Track what you eat, when you ate it, how much you ate and how that food made you feel. Being completely honest with yourself and writing down every single thing that passes through your lips will help you start to notice that maybe you actually do snack, possibly take in more sugar than you thought, eat when you're bored rather than just hungry or maybe that you have a habit of snacking before bed while watching TV.  The difference from simply tracking your food intake is you're taking into account how food makes you  feel, as well as what you're doing while you're eating. This is about becoming more mindful of what, when and why you eat.  7.  PRIORITIZE GOOD SLEEP  One of the strongest risk factors for being overweight is poor sleep. When you're feeling tired, you're more likely to choose unhealthy comfort foods and to skip your workout. Additionally, sleep deprivation may slow down your metabolism. [employer]! Therefore, sleeping 7-8 hours per night can help with weight loss without having to change your diet or increase your physical activity. And if you feel you snore and still wake up tired, talk with me about sleep apnea.  8.  SET ASIDE TIME TO DISCONNECT  Just get out there. Disconnect from the electronics and connect to the elements. Not only will this help reduce stress (a major factor in weight gain) by giving your mind a break from the constant stimulation we've all become so accustomed to, but it may also reprogram your brain to connect with yourself and what you're feeling.

## 2018-03-31 NOTE — Progress Notes (Signed)
Patient ID: Jenna Miller, female   DOB: [birth year], 62 y.o.   MRN: [id number]  Assessment and Plan:   Essential hypertension - continue medications, DASH diet, exercise and monitor at home. Call if greater than 130/80.  -     TSH  CKD Stage 2 (GFR 69 ml/min) Increase fluids, avoid NSAIDS, monitor sugars, will monitor -     BASIC METABOLIC PANEL WITH GFR  Hyperlipidemia -continue medications, check lipids, decrease fatty foods, increase activity.  -     Lipid panel  Medication management -     CBC with Differential/Platelet -     BASIC METABOLIC PANEL WITH GFR -     Hepatic function panel -     Magnesium  Acute non-recurrent maxillary sinusitis Will hold the zpak and take if she is not getting better, increase fluids, rest, cont allergy pill -     azithromycin (ZITHROMAX) 250 MG tablet; Take 2 tablets (500 mg) on  Day 1,  followed by 1 tablet (250 mg) once daily on Days 2 through 5. -     promethazine-dextromethorphan (PROMETHAZINE-DM) 6.25-15 MG/5ML syrup; Take 5 mLs by mouth 4 (four) times daily as needed for cough. -     fluticasone (FLONASE) 50 MCG/ACT nasal spray; Place 2 sprays into both nostrils daily.     Continue diet and meds as discussed. Further disposition pending results of labs. Future Appointments  Date Time Provider [place]  10/06/2018  9:00 AM Vicie Mutters, PA-C [id number] None    HPI 62 y.o. female  presents for 3 month follow up with hypertension, hyperlipidemia, prediabetes and vitamin D.  Went to [facility] Friday for sore throat that has been x 2 days, now a total of 7 days. She continues to have bilateral ear pain, cough.   Bitten by wheel bug in Jan, concerned about chagas/parasite- explained that that is not enemic here so she should be fine.    Her blood pressure has been controlled at home, she is only on HCTZ once a week, today their BP is BP: 124/74.   She does workout, has been walking, trying to get 10000 steps a day and she has a  new stand up desk at work. She denies chest pain, shortness of breath, dizziness. States her hair has been falling out more the past 3 days.  She has hypertensive CKD stage 2 Lab Results  Component Value Date   GFRNONAA 77 09/29/2017    She is on cholesterol medication, zetia and denies myalgias. Her cholesterol is at goal. The cholesterol last visit was:   Lab Results  Component Value Date   CHOL 191 09/29/2017   HDL 60 09/29/2017   LDLCALC 111 (H) 09/29/2017   TRIG 92 09/29/2017   CHOLHDL 3.2 09/29/2017    She has been working on diet and exercise for prediabetes, and denies foot ulcerations, hyperglycemia, hypoglycemia , increased appetite, nausea, paresthesia of the feet, polydipsia, polyuria, visual disturbances, vomiting and weight loss. Last A1C in the office was:  Lab Results  Component Value Date   HGBA1C 5.6 09/29/2017   Patient is on Vitamin D supplement.  Lab Results  Component Value Date   VD25OH 88 09/29/2017     BMI is Body mass index is 32.65 kg/m., she is working on diet and exercise. She has been walking 30 -40 miles a week and started strength training in sept.  Wt Readings from Last 3 Encounters:  03/31/18 181 lb 6.4 oz (82.3 kg)  09/29/17 181 lb (82.1  kg)  02/20/17 179 lb 9.6 oz (81.5 kg)     Current Medications:  Current Outpatient Medications on File Prior to Visit  Medication Sig Dispense Refill  . Biotin 5 MG CAPS Take by mouth.    . Cholecalciferol (VITAMIN D3) 2000 UNITS capsule Take 5,000 mg by mouth.    . Coenzyme Q10 (Q-10 CO-ENZYME PO) Take by mouth.    . ezetimibe (ZETIA) 10 MG tablet TAKE 1 TABLET BY MOUTH EVERY DAY FOR CHOLESTEROL**INS TO PAY 9/23** 90 tablet 1  . fluticasone (FLONASE) 50 MCG/ACT nasal spray PLACE 2 SPRAYS INTO BOTH NOSTRILS DAILY. 48 g 1  . hydrochlorothiazide (HYDRODIURIL) 25 MG tablet TAKE 1 TABLET BY MOUTH EVERY DAY FOR BLOOD PRESSURE AND FLUID 90 tablet 1  . pyridOXINE (VITAMIN B-6) 25 MG tablet Take 25 mg by mouth.     . vitamin B-12 (CYANOCOBALAMIN) 100 MCG tablet Take 100 mcg by mouth.    . ranitidine (ZANTAC) 300 MG tablet Take 1 to 2 tablets daily as needed for Acid Indigestion & Reflux 180 tablet PRN   No current facility-administered medications on file prior to visit.     Medical History:  Past Medical History:  Diagnosis Date  . Acid reflux   . Arthritis   . Cancer ([place])    Left Breast  . Complication of anesthesia    woke  up X 1  . Fatty liver   . High cholesterol   . Hypertension   . Vitamin D deficiency     Allergies:  Allergies  Allergen Reactions  . Lipitor [Atorvastatin] Anaphylaxis and Swelling    Other reaction(s): Other (See Comments) Unknown  . Effexor [Venlafaxine]     Other reaction(s): Other (See Comments)  . Paxil [Paroxetine Hcl]     Other reaction(s): Other (See Comments)  . Wellbutrin [Bupropion]     Other reaction(s): Other (See Comments) Hives Unknown Hives     Review of Systems:  Review of Systems  Constitutional: Negative for chills, fever and malaise/fatigue.  HENT: Negative for congestion, ear pain and sore throat.   Respiratory: Negative for cough, shortness of breath and wheezing.   Cardiovascular: Negative for chest pain, palpitations and leg swelling.  Gastrointestinal: Negative for blood in stool, constipation, diarrhea, heartburn, melena and nausea.  Genitourinary: Negative.   Neurological: Negative for dizziness, sensory change, loss of consciousness and headaches.  Psychiatric/Behavioral: Negative for depression. The patient is not nervous/anxious and does not have insomnia.     Family history- Review and unchanged  Social history- Review and unchanged  Physical Exam: BP 124/74   Pulse 68   Temp 97.7 F (36.5 C)   Resp 16   Ht 5' 2.5" (1.588 m)   Wt 181 lb 6.4 oz (82.3 kg)   LMP 06/10/2010   SpO2 98%   BMI 32.65 kg/m  Wt Readings from Last 3 Encounters:  03/31/18 181 lb 6.4 oz (82.3 kg)  09/29/17 181 lb (82.1 kg)   02/20/17 179 lb 9.6 oz (81.5 kg)    General Appearance: Well nourished well developed, in no apparent distress. Eyes: PERRLA, EOMs, conjunctiva no swelling or erythema ENT/Mouth: Ear canals normal without obstruction, swelling, erythma, discharge.  TMs normal bilaterally.  Oropharynx moist, clear, without exudate, or postoropharyngeal swelling. Neck: Supple, thyroid normal,no cervical adenopathy  Respiratory: Respiratory effort normal, Breath sounds clear A&P without rhonchi, wheeze, or rale.  No retractions, no accessory usage. Cardio: RRR with no MRGs. Brisk peripheral pulses without edema.  Abdomen: Soft, + BS,  Non tender, no guarding, rebound, hernias, masses. Musculoskeletal: Full ROM, 5/5 strength, Normal gait Skin: Warm, dry without rashes, lesions, ecchymosis.  Neuro: Awake and oriented X 3, Cranial nerves intact. Normal muscle tone, no cerebellar symptoms. Psych: Normal affect, Insight and Judgment appropriate.    Vicie Mutters, PA-C 8:43 AM [facility]

## 2018-04-01 ENCOUNTER — Encounter: Payer: Self-pay | Admitting: Physician Assistant

## 2018-08-18 ENCOUNTER — Other Ambulatory Visit: Payer: Self-pay | Admitting: Internal Medicine

## 2018-08-30 ENCOUNTER — Other Ambulatory Visit: Payer: Self-pay

## 2018-08-30 ENCOUNTER — Ambulatory Visit (INDEPENDENT_AMBULATORY_CARE_PROVIDER_SITE_OTHER): Payer: BLUE CROSS/BLUE SHIELD

## 2018-08-30 ENCOUNTER — Encounter (HOSPITAL_COMMUNITY): Payer: Self-pay | Admitting: Emergency Medicine

## 2018-08-30 ENCOUNTER — Ambulatory Visit (HOSPITAL_COMMUNITY)
Admission: EM | Admit: 2018-08-30 | Discharge: 2018-08-30 | Disposition: A | Discharge status: Home / Self Care | Payer: BLUE CROSS/BLUE SHIELD | Attending: Family Medicine | Admitting: Family Medicine

## 2018-08-30 DIAGNOSIS — M25532 Pain in left wrist: Secondary | ICD-10-CM

## 2018-08-30 DIAGNOSIS — M79642 Pain in left hand: Secondary | ICD-10-CM

## 2018-08-30 DIAGNOSIS — S6992XA Unspecified injury of left wrist, hand and finger(s), initial encounter: Secondary | ICD-10-CM | POA: Diagnosis not present

## 2018-08-30 DIAGNOSIS — S60222A Contusion of left hand, initial encounter: Secondary | ICD-10-CM

## 2018-08-30 NOTE — ED Triage Notes (Signed)
Dog made patient fall today.  Patient fell on concrete.  Bruising and swelling to back of left hand and sunken at left ring finger.  Brisk cap refill

## 2018-08-30 NOTE — ED Provider Notes (Signed)
[place]    CSN: [id number] Arrival date & time: 08/30/18  1706     History   Chief Complaint Chief Complaint  Patient presents with  . Fall    HPI Jenna Miller is a 62 y.o. female.  Patient fell this morning landing on her left arm.  The arm was actually pinned beneath her.  She now complains of pain in her hand and left wrist denies pain in elbow shoulder clavicle hip knee.  HPI  Past Medical History:  Diagnosis Date  . Acid reflux   . Arthritis   . Cancer ([place])    Left Breast  . Complication of anesthesia    woke  up X 1  . Fatty liver   . High cholesterol   . Hypertension   . Vitamin D deficiency     Patient Active Problem List   Diagnosis Date Noted  . CKD Stage 2 (GFR 69 ml/min) 09/22/2014  . Medication management 09/22/2014  . Prediabetes 09/22/2014  . Morbid Obesity (BMI 32.54) 09/22/2014  . Breast cancer, Left 09/22/2014  . GERD   . Vitamin D deficiency   . Fatty liver   . Hypertension   . Hyperlipidemia     Past Surgical History:  Procedure Laterality Date  . BREAST SURGERY  1995   mastectomy left  . CHOLECYSTECTOMY  11/19/2012   Procedure: LAPAROSCOPIC CHOLECYSTECTOMY WITH INTRAOPERATIVE CHOLANGIOGRAM;  Surgeon: Zenovia Jarred, MD;  Location: [facility];  Service: General;  Laterality: N/A;  . [place] ARTHROSCOPY  1982  . MASTECTOMY Left 1995  . TRAM  1999-2000  . WISDOM TOOTH EXTRACTION      OB History   None      Home Medications    Prior to Admission medications   Medication Sig Start Date End Date Taking? Authorizing Provider  Cholecalciferol (VITAMIN D3) 2000 UNITS capsule Take 5,000 mg by mouth.   Yes [provider]  Coenzyme Q10 (Q-10 CO-ENZYME PO) Take by mouth.   Yes [provider]  ezetimibe (ZETIA) 10 MG tablet TAKE 1 TABLET BY MOUTH EVERY DAY FOR CHOLESTEROL**INS TO PAY 9/23** 08/18/18  Yes Liane Comber, NP  vitamin B-12 (CYANOCOBALAMIN) 100 MCG tablet Take 100 mcg  by mouth.   Yes [provider]  Biotin 5 MG CAPS Take by mouth.    [provider]  fluticasone (FLONASE) 50 MCG/ACT nasal spray Place 2 sprays into both nostrils daily. 03/31/18   Vicie Mutters, PA-C  hydrochlorothiazide (HYDRODIURIL) 25 MG tablet TAKE 1 TABLET BY MOUTH EVERY DAY FOR BLOOD PRESSURE AND FLUID 12/01/16   Unk Pinto, MD  promethazine-dextromethorphan (PROMETHAZINE-DM) 6.25-15 MG/5ML syrup Take 5 mLs by mouth 4 (four) times daily as needed for cough. 03/31/18   Vicie Mutters, PA-C  pyridOXINE (VITAMIN B-6) 25 MG tablet Take 25 mg by mouth.    [provider]  ranitidine (ZANTAC) 300 MG tablet Take 1 to 2 tablets daily as needed for Acid Indigestion & Reflux 08/26/16 09/23/17  Unk Pinto, MD    Family History Family History  Problem Relation Age of Onset  . Hypertension Mother   . Hyperlipidemia Mother   . Cancer Cousin        lung  . Hyperlipidemia Brother   . Hypertension Brother     Social History Social History   Tobacco Use  . Smoking status: Never Smoker  . Smokeless tobacco: Never Used  Substance Use Topics  . Alcohol use: Yes  Comment: rare  . Drug use: No     Allergies   Lipitor [atorvastatin]; Effexor [venlafaxine]; Paxil [paroxetine hcl]; and Wellbutrin [bupropion]   Review of Systems Review of Systems  Constitutional: Negative for chills and fever.  HENT: Negative for ear pain and sore throat.   Eyes: Negative for pain and visual disturbance.  Respiratory: Negative for cough and shortness of breath.   Cardiovascular: Negative for chest pain and palpitations.  Gastrointestinal: Negative for abdominal pain and vomiting.  Genitourinary: Negative for dysuria and hematuria.  Musculoskeletal: Positive for arthralgias. Negative for back pain.  Skin: Negative for color change and rash.  Neurological: Negative for seizures and syncope.  All other systems reviewed and are negative.    Physical Exam Triage  Vital Signs ED Triage Vitals  Enc Vitals Group     BP 08/30/18 1811 129/62     Pulse Rate 08/30/18 1811 69     Resp 08/30/18 1811 18     Temp 08/30/18 1811 98.3 F (36.8 C)     Temp Source 08/30/18 1811 Oral     SpO2 08/30/18 1811 100 %     Weight --      Height --      Head Circumference --      Peak Flow --      Pain Score 08/30/18 1805 7     Pain Loc --      Pain Edu? --      Excl. in [place]? --    No data found.  Updated Vital Signs BP 129/62 (BP Location: Right Arm)   Pulse 69   Temp 98.3 F (36.8 C) (Oral)   Resp 18   LMP 06/10/2010   SpO2 100%   Visual Acuity Right Eye Distance:   Left Eye Distance:   Bilateral Distance:    Right Eye Near:   Left Eye Near:    Bilateral Near:     Physical Exam  Constitutional: She appears well-developed and well-nourished.  Neck: Normal range of motion. Neck supple.  Musculoskeletal:  Left hand: There is no deformity.  There is some soft tissue swelling of the dorsum of the left hand.  There is tenderness over the fourth and fifth metacarpals.  There is some pain in the radial aspect of the wrist with pronation supination.     UC Treatments / Results  Labs (all labs ordered are listed, but only abnormal results are displayed) Labs Reviewed - No data to display  EKG None  Radiology No results found.  Procedures Procedures (including critical care time)  Medications Ordered in UC Medications - No data to display  Initial Impression / Assessment and Plan / UC Course  I have reviewed the triage vital signs and the nursing notes.  Pertinent labs & imaging results that were available during my care of the patient were reviewed by me and considered in my medical decision making (see chart for details).     Contusion left hand.  Symptomatic care with ice and analgesic of choice.  Activities as tolerated Final Clinical Impressions(s) / UC Diagnoses   Final diagnoses:  None   Discharge Instructions   None    ED  Prescriptions    None     Controlled Substance Prescriptions  Controlled Substance Registry consulted? No   Wardell Honour, MD 08/30/18 [phone]

## 2018-10-04 NOTE — Progress Notes (Signed)
Patient ID: Jenna Miller, female   DOB: 1956/05/28, 62 y.o.   MRN: [id number] Complete physical  Assessment and Plan:  Hypertension:  -Continue medication,  -monitor blood pressure at home.  -Continue DASH diet.   -Reminder to go to the ER if any CP, SOB, nausea, dizziness, severe HA, changes vision/speech, left arm numbness and tingling, and jaw pain.  Cholesterol: -Continue diet and exercise.  -Check cholesterol.   Pre-diabetes: -Continue diet and exercise.  -Check A1C  Vitamin D Def: -check level -continue medications.   Obesity with co morbidities - long discussion about weight loss, diet, and exercise  Needs flu shot -     Flu vaccine   Gastroesophageal reflux disease, esophagitis presence not specified Continue PPI/H2 blocker, diet discussed  Fatty liver Check labs, avoid tylenol, alcohol, weight loss advised.   CKD Stage 2 (GFR 69 ml/min) -     BASIC METABOLIC PANEL WITH GFR - increase water  Vitamin D deficiency -     VITAMIN D 25 Hydroxy (Vit-D Deficiency, Fractures)  Malignant neoplasm of left female breast, unspecified estrogen receptor status, unspecified site of breast (HCC) monitored  Continue diet and meds as discussed. Further disposition pending results of labs.  HPI 62 y.o. female  presents for CPE and 3 month follow up with hypertension, hyperlipidemia, prediabetes and vitamin D.   Her blood pressure has been controlled at home, today their BP is BP: 114/72.   She does workout, has been walking, trying to get 6000 steps a day and she has a new stand up desk at work. She denies chest pain, shortness of breath, dizziness.  She has hypertensive CKD stage 2 Lab Results  Component Value Date   GFRNONAA 24 03/31/2018    She is on cholesterol medication, zetia and denies myalgias. Her cholesterol is at goal. The cholesterol last visit was:   Lab Results  Component Value Date   CHOL 214 (H) 03/31/2018   HDL 65 03/31/2018   LDLCALC 131 (H)  03/31/2018   TRIG 83 03/31/2018   CHOLHDL 3.3 03/31/2018    She has been working on diet and exercise for prediabetes, and denies foot ulcerations, hyperglycemia, hypoglycemia , increased appetite, nausea, paresthesia of the feet, polydipsia, polyuria, visual disturbances, vomiting and weight loss. Last A1C in the office was:  Lab Results  Component Value Date   HGBA1C 5.6 09/29/2017   Patient is on Vitamin D supplement, 8000 a day.   Lab Results  Component Value Date   VD25OH 88 09/29/2017     BMI is Body mass index is 33.18 kg/m., she is working on diet and exercise. Wt Readings from Last 3 Encounters:  10/06/18 181 lb 6.4 oz (82.3 kg)  03/31/18 181 lb 6.4 oz (82.3 kg)  09/29/17 181 lb (82.1 kg)     Current Medications:  Current Outpatient Medications on File Prior to Visit  Medication Sig Dispense Refill  . Biotin 5 MG CAPS Take by mouth.    . Cholecalciferol (VITAMIN D3) 2000 UNITS capsule Take 5,000 mg by mouth.    . Coenzyme Q10 (Q-10 CO-ENZYME PO) Take by mouth.    . ezetimibe (ZETIA) 10 MG tablet TAKE 1 TABLET BY MOUTH EVERY DAY FOR CHOLESTEROL**INS TO PAY 9/23** 90 tablet 1  . fluticasone (FLONASE) 50 MCG/ACT nasal spray Place 2 sprays into both nostrils daily. 48 g 1  . hydrochlorothiazide (HYDRODIURIL) 25 MG tablet TAKE 1 TABLET BY MOUTH EVERY DAY FOR BLOOD PRESSURE AND FLUID 90 tablet 1  .  pyridOXINE (VITAMIN B-6) 25 MG tablet Take 25 mg by mouth.    . vitamin B-12 (CYANOCOBALAMIN) 100 MCG tablet Take 100 mcg by mouth.    . ranitidine (ZANTAC) 300 MG tablet Take 1 to 2 tablets daily as needed for Acid Indigestion & Reflux 180 tablet PRN   No current facility-administered medications on file prior to visit.     Medical History:  Past Medical History:  Diagnosis Date  . Acid reflux   . Arthritis   . Cancer ([place])    Left Breast  . Complication of anesthesia    woke  up X 1  . Fatty liver   . High cholesterol   . Hypertension   . Vitamin D deficiency     Immunization History  Administered Date(s) Administered  . Influenza Inj Mdck Quad With Preservative 10/06/2018  . Influenza Split 08/29/2013, 10/16/2015  . Influenza, High Dose Seasonal PF 09/29/2017  . Influenza, Seasonal, Injecte, Preservative Fre 11/02/2014  . Influenza,inj,quad, With Preservative 09/24/2016  . PPD Test 03/14/2014, 07/11/2015  . Pneumococcal Polysaccharide-23 05/22/2003  . Td 05/28/2005  . Tdap 07/11/2015  . Zoster 02/26/2016    Tetanus: 2016 Pneumovax: 2004 Flu vaccine: 2019 Zostavax: 2017  MGM: 09/14/2017 Colonoscopy: 2017 CT AB 2014 CXR 2018 DEXA 2007 PAP 2016 declines another  Patient Care Team: Unk Pinto, MD as PCP - General (Internal Medicine)  Allergies Allergies  Allergen Reactions  . Lipitor [Atorvastatin] Anaphylaxis and Swelling    Other reaction(s): Other (See Comments) Unknown  . Effexor [Venlafaxine]     Other reaction(s): Other (See Comments)  . Paxil [Paroxetine Hcl]     Other reaction(s): Other (See Comments)  . Wellbutrin [Bupropion]     Other reaction(s): Other (See Comments) Hives Unknown Hives    SURGICAL HISTORY She  has a past surgical history that includes TRAM (1999-2000); Knee arthroscopy (1982); Wisdom tooth extraction; Cholecystectomy (11/19/2012); Breast surgery (1995); Hernia repair; and Mastectomy (Left, 1995). FAMILY HISTORY Her family history includes Cancer in her cousin; Hyperlipidemia in her brother and mother; Hypertension in her brother and mother. SOCIAL HISTORY She  reports that she has never smoked. She has never used smokeless tobacco. She reports that she drinks alcohol. She reports that she does not use drugs.  Review of Systems:  Review of Systems  Constitutional: Negative for chills, fever and malaise/fatigue.  HENT: Negative for congestion, ear pain and sore throat.   Respiratory: Negative for cough, shortness of breath and wheezing.   Cardiovascular: Negative for chest pain,  palpitations and leg swelling.  Gastrointestinal: Negative for blood in stool, constipation, diarrhea, heartburn, melena and nausea.  Genitourinary: Negative.   Neurological: Negative for dizziness, sensory change, loss of consciousness and headaches.  Psychiatric/Behavioral: Negative for depression. The patient is not nervous/anxious and does not have insomnia.     Physical Exam: BP 114/72   Pulse 83   Temp 98.6 F (37 C)   Resp 16   Ht 5\' 2"  (1.575 m)   Wt 181 lb 6.4 oz (82.3 kg)   LMP 06/10/2010   SpO2 98%   BMI 33.18 kg/m  Wt Readings from Last 3 Encounters:  10/06/18 181 lb 6.4 oz (82.3 kg)  03/31/18 181 lb 6.4 oz (82.3 kg)  09/29/17 181 lb (82.1 kg)    General Appearance: Well nourished well developed, in no apparent distress. Eyes: PERRLA, EOMs, conjunctiva no swelling or erythema ENT/Mouth: Ear canals normal without obstruction, swelling, erythma, discharge.  TMs normal bilaterally.  Oropharynx moist, clear, without exudate,  or postoropharyngeal swelling. Neck: Supple, thyroid normal,no cervical adenopathy  Respiratory: Respiratory effort normal, Breath sounds clear A&P without rhonchi, wheeze, or rale.  No retractions, no accessory usage. Cardio: RRR with no MRGs. Brisk peripheral pulses without edema.  Abdomen: Soft, + BS,  Non tender, no guarding, rebound, hernias, masses. Musculoskeletal: Full ROM, 5/5 strength, Normal gait Skin: Warm, dry without rashes, lesions, ecchymosis.  Neuro: Awake and oriented X 3, Cranial nerves intact. Normal muscle tone, no cerebellar symptoms. Psych: Normal affect, Insight and Judgment appropriate.    Vicie Mutters, PA-C 9:33 AM [facility]

## 2018-10-06 ENCOUNTER — Encounter: Payer: Self-pay | Admitting: Physician Assistant

## 2018-10-06 ENCOUNTER — Ambulatory Visit: Payer: BLUE CROSS/BLUE SHIELD | Admitting: Physician Assistant

## 2018-10-06 VITALS — BP 114/72 | HR 83 | Temp 98.6°F | Resp 16 | Ht 62.0 in | Wt 181.4 lb

## 2018-10-06 DIAGNOSIS — I10 Essential (primary) hypertension: Secondary | ICD-10-CM

## 2018-10-06 DIAGNOSIS — Z79899 Other long term (current) drug therapy: Secondary | ICD-10-CM | POA: Diagnosis not present

## 2018-10-06 DIAGNOSIS — Z1329 Encounter for screening for other suspected endocrine disorder: Secondary | ICD-10-CM

## 2018-10-06 DIAGNOSIS — Z131 Encounter for screening for diabetes mellitus: Secondary | ICD-10-CM | POA: Diagnosis not present

## 2018-10-06 DIAGNOSIS — Z130 Encounter for screening for diseases of the blood and blood-forming organs and certain disorders involving the immune mechanism: Secondary | ICD-10-CM | POA: Diagnosis not present

## 2018-10-06 DIAGNOSIS — Z23 Encounter for immunization: Secondary | ICD-10-CM

## 2018-10-06 DIAGNOSIS — Z0000 Encounter for general adult medical examination without abnormal findings: Secondary | ICD-10-CM

## 2018-10-06 DIAGNOSIS — E559 Vitamin D deficiency, unspecified: Secondary | ICD-10-CM

## 2018-10-06 DIAGNOSIS — K219 Gastro-esophageal reflux disease without esophagitis: Secondary | ICD-10-CM

## 2018-10-06 DIAGNOSIS — R7303 Prediabetes: Secondary | ICD-10-CM

## 2018-10-06 DIAGNOSIS — E782 Mixed hyperlipidemia: Secondary | ICD-10-CM

## 2018-10-06 DIAGNOSIS — N182 Chronic kidney disease, stage 2 (mild): Secondary | ICD-10-CM

## 2018-10-06 DIAGNOSIS — K760 Fatty (change of) liver, not elsewhere classified: Secondary | ICD-10-CM

## 2018-10-06 DIAGNOSIS — C50912 Malignant neoplasm of unspecified site of left female breast: Secondary | ICD-10-CM

## 2018-10-06 DIAGNOSIS — Z13220 Encounter for screening for lipoid disorders: Secondary | ICD-10-CM | POA: Diagnosis not present

## 2018-10-06 DIAGNOSIS — Z1389 Encounter for screening for other disorder: Secondary | ICD-10-CM

## 2018-10-06 MED ORDER — HYDROCHLOROTHIAZIDE 25 MG PO TABS: ORAL_TABLET | ORAL | 1 refills | Status: DC

## 2018-10-06 NOTE — Patient Instructions (Addendum)
Please advise to switch from zantac to famotidine (pepcid) 20-40 mg. - you can check with pharmacist about the brand that you are on to see if you can continue it. The contaminant is found in small amounts in various foods as well, likely slightly blown out of proportion.   Increase fiber foods Add miralax or stool softner like ducolax  VENOUS INSUFFICIENCY Our lower leg venous system is not the most reliable, the heart does NOT pump fluid up, there is a valve system.  The muscles of the leg squeeze and the blood moves up and a valve opens and close, then they squeeze, blood moves up and valves open and closes keeping the blood moving towards the heart.  Lots can go wrong with this valve system.  If someone is sitting or standing without movement, everyone will get swelling.  THINGS TO DO:  Do not stand or sit in one position for long periods of time. Do not sit with your legs crossed. Rest with your legs raised during the day.  Your legs have to be higher than your heart so that gravity will force the valves to open, so please really elevate your legs.   Wear elastic stockings or support hose. Do not wear other tight, encircling garments around the legs, pelvis, or waist.  ELASTIC THERAPY  has a wide variety of well priced compression stockings. [place], [place] [address] [id number] [place] has a good cheap selection, I like the socks, they are not as hard to get on  Walk as much as possible to increase blood flow.  Raise the foot of your bed at night with 2-inch blocks.  SEEK MEDICAL CARE IF:   The skin around your ankle starts to break down.  You have pain, redness, tenderness, or hard swelling developing in your leg over a vein.  You are uncomfortable due to leg pain.  If you ever have shortness of breath with exertion or chest pain go to the ER.    INFORMATION ABOUT CBD OIL  The studies for CBD are better for seizure disorders than any other claims  for CBD oil.  The biggest issues with it, is CBD oil is NOT regulated. A recent test of 18 over the counter CBD oil showed that 20% had TOO much, 60 % did not have the claimed amount, and 20 % had the claimed amount.   I have had some patients where it interacts with their medications as well and there is not a way for me to check the interactions since it is not a controlled substance.   With any other the counter medication OR supplement, if you try it, try to get from good source and if you have ANY abnormal symptoms over the next 3 months or don't see any benefits from it stop it.   The quality and quanity of the CBD oil varies greatly so I can not endorse patients taking it at this time.    Stasis Dermatitis Stasis dermatitis is a long-term (chronic) skin condition that happens when veins can no longer pump blood back to the heart (poor circulation). This condition causes a red or brown scaly rash or sores (ulcers) from the pooling of blood (stasis). This condition usually affects the lower legs. It may affect one leg or both legs. Without treatment, severe stasis dermatitis can lead to other skin conditions and infections. What are the causes? This condition is caused by poor circulation. What increases the risk?  This condition is more likely to develop in people who:  Are not very active.  Stand for long periods of time.  Have veins that have become enlarged and twisted (varicose veins).  Have leg veins that are not strong enough to send blood back to the heart (venous insufficiency).  Have had a blood clot.  Have been pregnant many times.  Have had vein surgery.  Are obese.  Have heart or kidney failure.  Are 80 years of age or older.  What are the signs or symptoms? Common early symptoms of this condition include:  Swelling in your ankle or leg. This might get better overnight but be worse again in the day.  Skin that looks thin on your ankle and leg.  [place]  marks that develop slowly.  Skin that is easily irritated or cracked.  Red, swollen skin.  An achy or heavy feeling after you walk or stand for long periods of time.  Pain.  Later and more severe symptoms of this condition include:  Skin that looks shiny.  Small, open sores (ulcers). These are often red or purple.  Dry, cracking skin.  Skin that feels hard.  Severe itching.  A change in the shape or color of your lower legs.  Severe pain.  Difficulty walking.  How is this diagnosed? Your health care provider may suspect this condition from your symptoms and medical history. Your health care provider will also do a physical exam. You may need to see a health care provider who specializes in skin diseases (dermatologist). You may also have tests to confirm the diagnosis, including:  Blood tests.  Imaging studies to check blood flow (Doppler ultrasound).  Allergy tests.  How is this treated? Treatment for this condition may include medicine, such as:  Corticosteroid creams and ointments.  Non-corticosteroid medicines applied to the skin (topical).  Medicine to reduce swelling in the legs (diuretics).  Antibiotics.  Medicine to relieve itching (antihistamines).  You may also have to wear:  Compression stockings or an elastic wrap to improve circulation.  A bandage (dressing).  A wrap that contains zinc and gelatin (Unna boot).  Follow these instructions at home: [place] your skin as told by your health care provider. Do not use moisturizers with fragrance. This can irritate your skin.  Apply cool compresses to the affected areas.  Do not scratch your skin.  Do not rub your skin dry after a bath or shower. Gently pat your skin dry.  Do not use scented soaps, detergents, or perfumes. Medicines  Take or use over-the-counter and prescription medicines only as told by your health care provider.  If you were prescribed an antibiotic  medicine, take or use it as told by your health care provider. Do not stop taking or using the antibiotic even if your condition starts to improve. Lifestyle  Do not stand or sit in one position for long periods of time.  Do not cross your legs when you sit.  Raise (elevate) your legs above the level of your heart when you are sitting or lying down.  Walk as told by your health care provider. Walking increases blood flow.  Wear comfortable, loose-fitting clothing. Circulation in your legs will be worse if you wear tight pants, belts, and waistbands. General instructions  Change and remove any dressing as told by your health care provider, if this applies.  Wear compression stockings as told by your health care provider, if this applies. These stockings help to prevent blood  clots and reduce swelling in your legs.  Wear the [employer] as told by your health care provider, if this applies.  Keep all follow-up visits as told by your health care provider. This is important. Contact a health care provider if:  Your condition does not improve with treatment.  Your condition gets worse.  You have signs of infection in the affected area. Watch for: ? Swelling. ? Tenderness. ? Redness. ? Soreness. ? Warmth.  You have a fever. Get help right away if:  You notice red streaks coming from the affected area.  Your bone or joint underneath the affected area becomes painful after the skin has healed.  The affected area turns darker.  You feel a deep pain in your leg or groin.  You are short of breath. This information is not intended to replace advice given to you by your health care provider. Make sure you discuss any questions you have with your health care provider. Document Released: 03/26/2006 Document Revised: 08/12/2016 Document Reviewed: 05/02/2015 Elsevier Interactive Patient Education  [employer].

## 2018-10-07 ENCOUNTER — Other Ambulatory Visit: Payer: Self-pay

## 2018-10-07 DIAGNOSIS — N182 Chronic kidney disease, stage 2 (mild): Secondary | ICD-10-CM

## 2018-10-07 DIAGNOSIS — R7303 Prediabetes: Secondary | ICD-10-CM

## 2018-10-07 DIAGNOSIS — K219 Gastro-esophageal reflux disease without esophagitis: Secondary | ICD-10-CM

## 2018-10-07 DIAGNOSIS — K760 Fatty (change of) liver, not elsewhere classified: Secondary | ICD-10-CM

## 2018-10-07 DIAGNOSIS — I10 Essential (primary) hypertension: Secondary | ICD-10-CM

## 2018-10-07 LAB — URINALYSIS, ROUTINE W REFLEX MICROSCOPIC
BILIRUBIN URINE: NEGATIVE
Bacteria, UA: NONE SEEN /HPF
GLUCOSE, UA: NEGATIVE
Hgb urine dipstick: NEGATIVE
Hyaline Cast: NONE SEEN /LPF
KETONES UR: NEGATIVE
NITRITE: NEGATIVE
Protein, ur: NEGATIVE
RBC / HPF: NONE SEEN /HPF (ref 0–2)
SQUAMOUS EPITHELIAL / LPF: NONE SEEN /HPF (ref <=5)
Specific Gravity, Urine: 1.005 (ref 1.001–1.03)
WBC, UA: NONE SEEN /HPF (ref 0–5)
pH: 6.5 (ref 5.0–8.0)

## 2018-10-07 LAB — CBC WITH DIFFERENTIAL/PLATELET
BASOS ABS: 41 {cells}/uL (ref 0–200)
BASOS PCT: 0.7 %
EOS ABS: 81 {cells}/uL (ref 15–500)
Eosinophils Relative: 1.4 %
HEMATOCRIT: 39.8 % (ref 35.0–45.0)
HEMOGLOBIN: 13.5 g/dL (ref 11.7–15.5)
LYMPHS ABS: 1717 {cells}/uL (ref 850–3900)
MCH: 29.9 pg (ref 27.0–33.0)
MCHC: 33.9 g/dL (ref 32.0–36.0)
MCV: 88.2 fL (ref 80.0–100.0)
MONOS PCT: 7.1 %
MPV: 11.8 fL (ref 7.5–12.5)
NEUTROS ABS: 3550 {cells}/uL (ref 1500–7800)
Neutrophils Relative %: 61.2 %
Platelets: 150 10*3/uL (ref 140–400)
RBC: 4.51 10*6/uL (ref 3.80–5.10)
RDW: 12.5 % (ref 11.0–15.0)
Total Lymphocyte: 29.6 %
WBC mixed population: 412 cells/uL (ref 200–950)
WBC: 5.8 10*3/uL (ref 3.8–10.8)

## 2018-10-07 LAB — COMPLETE METABOLIC PANEL WITH GFR
AG Ratio: 2.1 (calc) (ref 1.0–2.5)
ALKALINE PHOSPHATASE (APISO): 52 U/L (ref 33–130)
ALT: 24 U/L (ref 6–29)
AST: 21 U/L (ref 10–35)
Albumin: 4.7 g/dL (ref 3.6–5.1)
BUN: 17 mg/dL (ref 7–25)
CALCIUM: 10.1 mg/dL (ref 8.6–10.4)
CO2: 26 mmol/L (ref 20–32)
Chloride: 99 mmol/L (ref 98–110)
Creat: 0.92 mg/dL (ref 0.50–0.99)
GFR, Est African American: 77 mL/min/{1.73_m2} (ref >=60)
GFR, Est Non African American: 66 mL/min/{1.73_m2} (ref >=60)
GLUCOSE: 100 mg/dL — AB (ref 65–99)
Globulin: 2.2 g/dL (calc) (ref 1.9–3.7)
Potassium: 3.8 mmol/L (ref 3.5–5.3)
SODIUM: 138 mmol/L (ref 135–146)
Total Bilirubin: 0.5 mg/dL (ref 0.2–1.2)
Total Protein: 6.9 g/dL (ref 6.1–8.1)

## 2018-10-07 LAB — IRON, TOTAL/TOTAL IRON BINDING CAP
%SAT: 21 % (calc) (ref 16–45)
IRON: 82 ug/dL (ref 45–160)
TIBC: 396 ug/dL (ref 250–450)

## 2018-10-07 LAB — TSH: TSH: 1.12 m[IU]/L (ref 0.40–4.50)

## 2018-10-07 LAB — MICROALBUMIN / CREATININE URINE RATIO
Creatinine, Urine: 26 mg/dL (ref 20–275)
Microalb Creat Ratio: 8 mcg/mg creat (ref <30)
Microalb, Ur: 0.2 mg/dL

## 2018-10-07 LAB — LIPID PANEL
CHOL/HDL RATIO: 3.4 (calc) (ref <5.0)
CHOLESTEROL: 235 mg/dL — AB (ref <200)
HDL: 70 mg/dL (ref >50)
LDL Cholesterol (Calc): 146 mg/dL (calc) — ABNORMAL HIGH
Non-HDL Cholesterol (Calc): 165 mg/dL (calc) — ABNORMAL HIGH (ref <130)
TRIGLYCERIDES: 84 mg/dL (ref <150)

## 2018-10-07 LAB — HEMOGLOBIN A1C
EAG (MMOL/L): 6.5 (calc)
Hgb A1c MFr Bld: 5.7 % of total Hgb — ABNORMAL HIGH (ref <5.7)
Mean Plasma Glucose: 117 (calc)

## 2018-10-07 LAB — VITAMIN B12: Vitamin B-12: >2000 pg/mL — ABNORMAL HIGH (ref 200–1100)

## 2018-10-07 LAB — VITAMIN D 25 HYDROXY (VIT D DEFICIENCY, FRACTURES): Vit D, 25-Hydroxy: 83 ng/mL (ref 30–100)

## 2018-10-07 LAB — MAGNESIUM: Magnesium: 2 mg/dL (ref 1.5–2.5)

## 2018-10-07 MED ORDER — RANITIDINE HCL 300 MG PO TABS: ORAL_TABLET | ORAL | 3 refills | Status: DC

## 2018-11-09 ENCOUNTER — Ambulatory Visit: Payer: BLUE CROSS/BLUE SHIELD | Admitting: Registered"

## 2018-12-06 ENCOUNTER — Other Ambulatory Visit: Payer: Self-pay | Admitting: Obstetrics and Gynecology

## 2018-12-06 DIAGNOSIS — Z1231 Encounter for screening mammogram for malignant neoplasm of breast: Secondary | ICD-10-CM

## 2018-12-09 ENCOUNTER — Ambulatory Visit
Admission: RE | Admit: 2018-12-09 | Discharge: 2018-12-09 | Disposition: A | Discharge status: Home / Self Care | Payer: BLUE CROSS/BLUE SHIELD | Source: Ambulatory Visit | Attending: Obstetrics and Gynecology | Admitting: Obstetrics and Gynecology

## 2018-12-09 DIAGNOSIS — Z1231 Encounter for screening mammogram for malignant neoplasm of breast: Secondary | ICD-10-CM | POA: Diagnosis not present

## 2019-01-13 DIAGNOSIS — H25013 Cortical age-related cataract, bilateral: Secondary | ICD-10-CM | POA: Diagnosis not present

## 2019-01-13 DIAGNOSIS — I7090 Unspecified atherosclerosis: Secondary | ICD-10-CM | POA: Diagnosis not present

## 2019-01-13 DIAGNOSIS — H2513 Age-related nuclear cataract, bilateral: Secondary | ICD-10-CM | POA: Diagnosis not present

## 2019-01-13 LAB — HM DIABETES EYE EXAM

## 2019-01-14 ENCOUNTER — Ambulatory Visit: Payer: Self-pay | Admitting: Physician Assistant

## 2019-02-02 DIAGNOSIS — K5900 Constipation, unspecified: Secondary | ICD-10-CM | POA: Diagnosis not present

## 2019-02-07 NOTE — Progress Notes (Signed)
Patient ID: Jenna Miller, female   DOB: 1956/12/06, 63 y.o.   MRN: [id number]  Assessment and Plan:   Essential hypertension - continue medications, DASH diet, exercise and monitor at home. Call if greater than 130/80.  -cut back on the HCTZ to 2 x a week or stop to see if this helps with the constipation -     TSH  CKD Stage 2 (GFR 69 ml/min) Increase fluids, avoid NSAIDS, monitor sugars, will monitor -     BASIC METABOLIC PANEL WITH GFR  Hyperlipidemia -continue medications, check lipids, decrease fatty foods, increase activity.  -     Lipid panel  Medication management -     CBC with Differential/Platelet -     BASIC METABOLIC PANEL WITH GFR -     Hepatic function panel -     Magnesium  Acute viral rhinitis Discussed the importance of avoiding unnecessary antibiotic therapy. Suggested symptomatic OTC remedies. Nasal saline spray for congestion. Nasal steroids, allergy pill, oral steroids offered Follow up as needed.  Constipation Discussed linzess, patient will start and discussed down if needed- 30 mins before food ? If HCTZ if contributing, started to take daily, can do once a week or stop and monitor BP to see if this helps ? recetocele- if she has it discussed pelvic PT as an option to help with constipation- will follow up with GYN  Continue diet and meds as discussed. Further disposition pending results of labs. Future Appointments  Date Time Provider [place]  10/13/2019  9:00 AM Vicie Mutters, PA-C [id number] None    HPI 63 y.o. female  presents for 3 month follow up with hypertension, hyperlipidemia, prediabetes and vitamin D.  Recently saw Dr. Talmage Nap courtney for chronic constipation, has not started on linzess 145- will try it after counseling. Has been told in the past she may have a rectocele, encouraged patient to follow up with GYN.   Woke up this AM with cough, hoarseness.    Her blood pressure has been controlled at home, she is  only on HCTZ every day, today their BP is BP: 124/76.   She does workout, has been walking, trying to get 10000 steps a day.  She denies chest pain, shortness of breath, dizziness. States her hair has been falling out more the past 3 days.  She has hypertensive CKD stage 2 Lab Results  Component Value Date   GFRNONAA 66 10/06/2018    She is on cholesterol medication, zetia and denies myalgias. Her cholesterol is at goal. The cholesterol last visit was:   Lab Results  Component Value Date   CHOL 235 (H) 10/06/2018   HDL 70 10/06/2018   LDLCALC 146 (H) 10/06/2018   TRIG 84 10/06/2018   CHOLHDL 3.4 10/06/2018    She has been working on diet and exercise for prediabetes, and denies foot ulcerations, hyperglycemia, hypoglycemia , increased appetite, nausea, paresthesia of the feet, polydipsia, polyuria, visual disturbances, vomiting and weight loss. Last A1C in the office was:  Lab Results  Component Value Date   HGBA1C 5.7 (H) 10/06/2018   Patient is on Vitamin D supplement.  Lab Results  Component Value Date   VD25OH 83 10/06/2018     BMI is Body mass index is 32.89 kg/m., she is working on diet and exercise. She has been walking 30 -40 miles a week and started strength training in sept.  Wt Readings from Last 3 Encounters:  02/09/19 179 lb 12.8 oz (81.6 kg)  10/06/18 181 lb  6.4 oz (82.3 kg)  03/31/18 181 lb 6.4 oz (82.3 kg)     Current Medications:  Current Outpatient Medications on File Prior to Visit  Medication Sig Dispense Refill  . Biotin 5 MG CAPS Take by mouth.    . Cholecalciferol (VITAMIN D3) 2000 UNITS capsule Take 5,000 mg by mouth.    . Coenzyme Q10 (Q-10 CO-ENZYME PO) Take by mouth.    . ezetimibe (ZETIA) 10 MG tablet TAKE 1 TABLET BY MOUTH EVERY DAY FOR CHOLESTEROL**INS TO PAY 9/23** 90 tablet 1  . fluticasone (FLONASE) 50 MCG/ACT nasal spray Place 2 sprays into both nostrils daily. 48 g 1  . hydrochlorothiazide (HYDRODIURIL) 25 MG tablet TAKE 1 TABLET BY MOUTH  EVERY DAY FOR BLOOD PRESSURE AND FLUID 90 tablet 1  . ranitidine (ZANTAC) 300 MG tablet Take 1 to 2 tablets daily as needed for Acid Indigestion & Reflux 180 tablet 3   No current facility-administered medications on file prior to visit.     Medical History:  Past Medical History:  Diagnosis Date  . Acid reflux   . Arthritis   . Cancer ([employer] [facility])    Left Breast  . Complication of anesthesia    woke  up X 1  . Fatty liver   . High cholesterol   . Hypertension   . Vitamin D deficiency     Allergies:  Allergies  Allergen Reactions  . Lipitor [Atorvastatin] Anaphylaxis and Swelling    Other reaction(s): Other (See Comments) Unknown  . Effexor [Venlafaxine]     Other reaction(s): Other (See Comments)  . Paxil [Paroxetine Hcl]     Other reaction(s): Other (See Comments)  . Wellbutrin [Bupropion]     Other reaction(s): Other (See Comments) Hives Unknown Hives     Review of Systems:  Review of Systems  Constitutional: Negative for chills, fever and malaise/fatigue.  HENT: Negative for congestion, ear pain and sore throat.   Respiratory: Negative for cough, shortness of breath and wheezing.   Cardiovascular: Negative for chest pain, palpitations and leg swelling.  Gastrointestinal: Negative for blood in stool, constipation, diarrhea, heartburn, melena and nausea.  Genitourinary: Negative.   Neurological: Negative for dizziness, sensory change, loss of consciousness and headaches.  Psychiatric/Behavioral: Negative for depression. The patient is not nervous/anxious and does not have insomnia.     Family history- Review and unchanged  Social history- Review and unchanged  Physical Exam: BP 124/76   Pulse 71   Temp (!) 97.2 F (36.2 C)   Wt 179 lb 12.8 oz (81.6 kg)   LMP 06/10/2010   SpO2 95%   BMI 32.89 kg/m  Wt Readings from Last 3 Encounters:  02/09/19 179 lb 12.8 oz (81.6 kg)  10/06/18 181 lb 6.4 oz (82.3 kg)  03/31/18 181 lb 6.4 oz (82.3 kg)    General  Appearance: Well nourished well developed, in no apparent distress. Eyes: PERRLA, EOMs, conjunctiva no swelling or erythema ENT/Mouth: Ear canals normal without obstruction, swelling, erythma, discharge.  TMs normal bilaterally.  Oropharynx moist, clear, without exudate, or postoropharyngeal swelling. Neck: Supple, thyroid normal,no cervical adenopathy  Respiratory: Respiratory effort normal, Breath sounds clear A&P without rhonchi, wheeze, or rale.  No retractions, no accessory usage. Cardio: RRR with no MRGs. Brisk peripheral pulses without edema.  Abdomen: Soft, + BS,  Non tender, no guarding, rebound, hernias, masses. Musculoskeletal: Full ROM, 5/5 strength, Normal gait Skin: Warm, dry without rashes, lesions, ecchymosis.  Neuro: Awake and oriented X 3, Cranial nerves intact. Normal muscle tone, no  cerebellar symptoms. Psych: Normal affect, Insight and Judgment appropriate.    Vicie Mutters, PA-C 9:46 AM [facility]

## 2019-02-09 ENCOUNTER — Encounter: Payer: Self-pay | Admitting: Physician Assistant

## 2019-02-09 ENCOUNTER — Ambulatory Visit: Payer: BLUE CROSS/BLUE SHIELD | Admitting: Physician Assistant

## 2019-02-09 VITALS — BP 124/76 | HR 71 | Temp 97.2°F | Wt 179.8 lb

## 2019-02-09 DIAGNOSIS — Z79899 Other long term (current) drug therapy: Secondary | ICD-10-CM

## 2019-02-09 DIAGNOSIS — E782 Mixed hyperlipidemia: Secondary | ICD-10-CM

## 2019-02-09 DIAGNOSIS — K5909 Other constipation: Secondary | ICD-10-CM | POA: Insufficient documentation

## 2019-02-09 DIAGNOSIS — K5900 Constipation, unspecified: Secondary | ICD-10-CM

## 2019-02-09 DIAGNOSIS — I10 Essential (primary) hypertension: Secondary | ICD-10-CM

## 2019-02-09 DIAGNOSIS — K760 Fatty (change of) liver, not elsewhere classified: Secondary | ICD-10-CM

## 2019-02-09 DIAGNOSIS — N182 Chronic kidney disease, stage 2 (mild): Secondary | ICD-10-CM | POA: Diagnosis not present

## 2019-02-09 NOTE — Patient Instructions (Addendum)
Can do a steroid nasal spary 1-2 sparys at night each nostril.  Remember to spray each nostril twice towards the outer part of your eye.   Do not sniff but instead pinch your nose and tilt your head back to help the medicine get into your sinuses.   The best time to do this is at bedtime.  Stop if you get blurred vision or nose bleeds.   THIS WILL TAKE 7 DAYS TO WORK AND IS BETTER IF YOU START BEFORE SYMPTOMS SO IF YOU HAVE A SEASON OR TIME OF THE YEAR YOU ALWAYS GET A COLD, START BEFORE THAT!  If you are not better in 10 days please send me a message  Follow up with GYN If + rectocele or prolapse- can try pelvic floor PT versus procedure/surgery  Can stop the HCTZ for 1 week, push water and monitor BP, see how you do.  VENOUS INSUFFICIENCY Our lower leg venous system is not the most reliable, the heart does NOT pump fluid up, there is a valve system.  The muscles of the leg squeeze and the blood moves up and a valve opens and close, then they squeeze, blood moves up and valves open and closes keeping the blood moving towards the heart.  Lots can go wrong with this valve system.  If someone is sitting or standing without movement, everyone will get swelling.  THINGS TO DO:  Do not stand or sit in one position for long periods of time. Do not sit with your legs crossed. Rest with your legs raised during the day.  Your legs have to be higher than your heart so that gravity will force the valves to open, so please really elevate your legs.   Wear elastic stockings or support hose. Do not wear other tight, encircling garments around the legs, pelvis, or waist.  ELASTIC THERAPY  has a wide variety of well priced compression stockings. [place], [place] [id number] [id number] [place] has a good cheap selection, I like the socks, they are not as hard to get on  Walk as much as possible to increase blood flow.  Raise the foot of your bed at night with 2-inch  blocks.  SEEK MEDICAL CARE IF:   The skin around your ankle starts to break down.  You have pain, redness, tenderness, or hard swelling developing in your leg over a vein.  You are uncomfortable due to leg pain.  If you ever have shortness of breath with exertion or chest pain go to the ER.    COLD INFORMATION  Try just the allergy pill and prednisone for a few days, you always want to give your body 10 days to fight off infection with help before taking an anabiotic.   BEWARE ANTIBIOTICS Antibiotics have been linked with colon infections, resistance and newest theory is colon cancer in 40-50 year olds. So it is VERY important to try to avoid them, antibiotics are NOT risk free medications.   If you are not feeling better make an office visit OR CONTACT us.   Here is more info below HOW TO TREAT VIRAL COUGH AND COLD SYMPTOMS:  -Symptoms usually last at least 1 week with the worst symptoms being around day 4.  - colds usually start with a sore throat and end with a cough, and the cough can take 2 weeks to get better.  -No antibiotics are needed for colds, flu, sore throats, cough, bronchitis UNLESS symptoms are longer than  7 days OR if you are getting better then get drastically worse.  -There are a lot of combination medications (Dayquil, Nyquil, Vicks 44, tyelnol cold and sinus, ETC). Please look at the ingredients on the back so that you are treating the correct symptoms and not doubling up on medications/ingredients.    Medicines you can use  Nasal congestion  Little Remedies saline spray (aerosol/mist)- can try this, it is in the kids section - pseudoephedrine (Sudafed)- behind the counter, do not use if you have high blood pressure, medicine that have -D in them.  - phenylephrine (Sudafed PE) -Dextormethorphan + chlorpheniramine (Coridcidin HBP)- okay if you have high blood pressure -Oxymetazoline (Afrin) nasal spray- LIMIT to 3 days -Saline nasal spray -Neti pot (used  distilled or bottled water)  Ear pain/congestion  -pseudoephedrine (sudafed) - Nasonex/flonase nasal spray  Fever  -Acetaminophen (Tyelnol) -Ibuprofen (Advil, motrin, aleve)  Sore Throat  -Acetaminophen (Tyelnol) -Ibuprofen (Advil, motrin, aleve) -Drink a lot of water -Gargle with salt water - Rest your voice (don't talk) -Throat sprays -Cough drops  Body Aches  -Acetaminophen (Tyelnol) -Ibuprofen (Advil, motrin, aleve)  Headache  -Acetaminophen (Tyelnol) -Ibuprofen (Advil, motrin, aleve) - Exedrin, Exedrin Migraine  Allergy symptoms (cough, sneeze, runny nose, itchy eyes) -Claritin or loratadine cheapest but likely the weakest  -Zyrtec or certizine at night because it can make you sleepy -The strongest is allegra or fexafinadine  Cheapest at [employer], [employer], [employer]  Cough  -Dextromethorphan (Delsym)- medicine that has DM in it -Guafenesin (Mucinex/Robitussin) - cough drops - drink lots of water  Chest Congestion  -Guafenesin (Mucinex/Robitussin)  Red Itchy Eyes  - Naphcon-A  Upset Stomach  - Bland diet (nothing spicy, greasy, fried, and high acid foods like tomatoes, oranges, berries) -OKAY- cereal, bread, soup, crackers, rice -Eat smaller more frequent meals -reduce caffeine, no alcohol -Loperamide (Imodium-AD) if diarrhea -Prevacid for heart burn  General health when sick  -Hydration -wash your hands frequently -keep surfaces clean -change pillow cases and sheets often -Get fresh air but do not exercise strenuously -Vitamin D, double up on it - Vitamin C -Zinc    About Rectocele  Overview  A rectocele is a type of hernia which causes different degrees of bulging of the rectal tissues into the vaginal wall.  You may even notice that it presses against the vaginal wall so much that some vaginal tissues droop outside of the opening of your vagina.  Causes of Rectocele  The most common cause is childbirth.  The muscles and ligaments in the  pelvis that hold up and support the female organs and vagina become stretched and weakened during labor and delivery.  The more babies you have, the more the support tissues are stretched and weakened.  Not everyone who has a baby will develop a rectocele.  Some women have stronger supporting tissue in the pelvis and may not have as much of a problem as others.  Women who have a Cesarean section usually do not get rectocele's unless they pushed a long time prior to the cesarean delivery.  Other conditions that can cause a rectocele include chronic constipation, a chronic cough, a lot of heavy lifting, and obesity.  Older women may have this problem because the loss of female hormones causes the vaginal tissue to become weaker.  Symptoms  There may not be any symptoms.  If you do have symptoms, they may include:  Pelvic pressure in the rectal area  Protrusion of the lower part of the vagina through the opening  of the vagina  Constipation and trapping of the stool, making it difficult to have a bowel movement.  In severe cases, you may have to press on the lower part of your vagina to help push the stool out of you rectum.  This is called splinting to empty.  Diagnosing Rectocele  Your health care provider will ask about your symptoms and perform a pelvic exam.  S/he will ask you to bear down, pushing like you are having a bowel movement so as to see how far the lower part of the vagina protrudes into the vagina and possible outside of the vagina.  Your provider will also ask you to contract the muscles of your pelvis (like you are stopping the stream in the middle of urinating) to determine the strength of your pelvic muscles.  Your provider may also do a rectal exam.  Treatment Options  If you do not have any symptoms, no treatment may be necessary.  Other treatment options include:  Pelvic floor exercises: Contracting the muscles in your genital area may help strengthen your muscles and  support the organs.  Be sure to get proper exercise instruction from you physical therapist.  A pessary (removealbe pelvic support device) sometimes helps rectocele symptoms.  Surgery: Surgical repair may be necessary. In some cases the uterus may need to be taken out ( a hysterectomy) as well.  There are many types of surgery for pelvic support problems.  Look for physicians who specialize in repair procedures.  You can take care of yourself by:  Treating and preventing constipation  Avoiding heavy lifting, and lifting correctly (with your legs, not with you waist or back)  Treating a chronic cough or bronchitis  Not smoking  avoiding too much weight gain  Doing pelvic floor exercises   2007, [employer] Doc.33

## 2019-02-10 LAB — CBC WITH DIFFERENTIAL/PLATELET
Absolute Monocytes: 578 cells/uL (ref 200–950)
BASOS PCT: 0.3 %
Basophils Absolute: 18 cells/uL (ref 0–200)
EOS ABS: 71 {cells}/uL (ref 15–500)
Eosinophils Relative: 1.2 %
HCT: 37.7 % (ref 35.0–45.0)
HEMOGLOBIN: 13 g/dL (ref 11.7–15.5)
Lymphs Abs: 1558 cells/uL (ref 850–3900)
MCH: 30.4 pg (ref 27.0–33.0)
MCHC: 34.5 g/dL (ref 32.0–36.0)
MCV: 88.1 fL (ref 80.0–100.0)
MPV: 11.1 fL (ref 7.5–12.5)
Monocytes Relative: 9.8 %
NEUTROS ABS: 3676 {cells}/uL (ref 1500–7800)
Neutrophils Relative %: 62.3 %
Platelets: 222 10*3/uL (ref 140–400)
RBC: 4.28 10*6/uL (ref 3.80–5.10)
RDW: 13.2 % (ref 11.0–15.0)
Total Lymphocyte: 26.4 %
WBC: 5.9 10*3/uL (ref 3.8–10.8)

## 2019-02-10 LAB — LIPID PANEL
Cholesterol: 246 mg/dL — ABNORMAL HIGH (ref <200)
HDL: 68 mg/dL (ref >=50)
LDL Cholesterol (Calc): 159 mg/dL (calc) — ABNORMAL HIGH
NON-HDL CHOLESTEROL (CALC): 178 mg/dL — AB (ref <130)
TRIGLYCERIDES: 89 mg/dL (ref <150)
Total CHOL/HDL Ratio: 3.6 (calc) (ref <5.0)

## 2019-02-10 LAB — TSH: TSH: 1.03 mIU/L (ref 0.40–4.50)

## 2019-02-10 LAB — COMPLETE METABOLIC PANEL WITH GFR
AG RATIO: 1.8 (calc) (ref 1.0–2.5)
ALBUMIN MSPROF: 4.8 g/dL (ref 3.6–5.1)
ALT: 20 U/L (ref 6–29)
AST: 19 U/L (ref 10–35)
Alkaline phosphatase (APISO): 54 U/L (ref 37–153)
BILIRUBIN TOTAL: 0.5 mg/dL (ref 0.2–1.2)
BUN: 17 mg/dL (ref 7–25)
CHLORIDE: 100 mmol/L (ref 98–110)
CO2: 29 mmol/L (ref 20–32)
Calcium: 10.5 mg/dL — ABNORMAL HIGH (ref 8.6–10.4)
Creat: 0.9 mg/dL (ref 0.50–0.99)
GFR, EST AFRICAN AMERICAN: 79 mL/min/{1.73_m2} (ref >=60)
GFR, Est Non African American: 68 mL/min/{1.73_m2} (ref >=60)
Globulin: 2.6 g/dL (calc) (ref 1.9–3.7)
Glucose, Bld: 94 mg/dL (ref 65–99)
POTASSIUM: 4.1 mmol/L (ref 3.5–5.3)
Sodium: 141 mmol/L (ref 135–146)
TOTAL PROTEIN: 7.4 g/dL (ref 6.1–8.1)

## 2019-02-10 LAB — MAGNESIUM: MAGNESIUM: 2 mg/dL (ref 1.5–2.5)

## 2019-02-16 ENCOUNTER — Encounter: Payer: Self-pay | Admitting: *Deleted

## 2019-03-01 DIAGNOSIS — Z01419 Encounter for gynecological examination (general) (routine) without abnormal findings: Secondary | ICD-10-CM | POA: Diagnosis not present

## 2019-03-01 DIAGNOSIS — Z6834 Body mass index (BMI) 34.0-34.9, adult: Secondary | ICD-10-CM | POA: Diagnosis not present

## 2019-03-01 LAB — HM PAP SMEAR: HM Pap smear: NEGATIVE

## 2019-05-07 ENCOUNTER — Other Ambulatory Visit: Payer: Self-pay | Admitting: Adult Health

## 2019-05-08 ENCOUNTER — Other Ambulatory Visit: Payer: Self-pay | Admitting: Physician Assistant

## 2019-05-08 DIAGNOSIS — I10 Essential (primary) hypertension: Secondary | ICD-10-CM

## 2019-05-31 ENCOUNTER — Other Ambulatory Visit: Payer: Self-pay | Admitting: Physician Assistant

## 2019-05-31 MED ORDER — FAMOTIDINE 40 MG PO TABS: ORAL_TABLET | ORAL | 1 refills | Status: DC

## 2019-06-01 ENCOUNTER — Ambulatory Visit: Payer: Self-pay | Admitting: Physician Assistant

## 2019-06-01 ENCOUNTER — Other Ambulatory Visit: Payer: Self-pay | Admitting: Internal Medicine

## 2019-07-27 DIAGNOSIS — F419 Anxiety disorder, unspecified: Secondary | ICD-10-CM | POA: Diagnosis not present

## 2019-07-27 DIAGNOSIS — K5900 Constipation, unspecified: Secondary | ICD-10-CM | POA: Diagnosis not present

## 2019-10-10 NOTE — Progress Notes (Signed)
Patient ID: Jenna Miller, female   DOB: [birth year], 63 y.o.   MRN: [id number] Complete physical  Assessment and Plan:  Hypertension:  -Continue medication,  -monitor blood pressure at home.  -Continue DASH diet.   -Reminder to go to the ER if any CP, SOB, nausea, dizziness, severe HA, changes vision/speech, left arm numbness and tingling, and jaw pain.  Cholesterol: -Continue diet and exercise.  -Check cholesterol.   Abnormal glucose -Continue diet and exercise.  -Check A1C  Vitamin D Def: -check level -continue medications.   Obesity with co morbidities - long discussion about weight loss, diet, and exercise  Needs flu shot -     Flu vaccine   Gastroesophageal reflux disease, esophagitis presence not specified Continue PPI/H2 blocker, diet discussed  Fatty liver Check labs, avoid tylenol, alcohol, weight loss advised.   CKD Stage 2 (GFR 69 ml/min) -     BASIC METABOLIC PANEL WITH GFR - increase water - check microalbumin  Vitamin D deficiency -     VITAMIN D 25 Hydroxy (Vit-D Deficiency, Fractures)  Malignant neoplasm of left female breast, unspecified estrogen receptor status, unspecified site of breast (HCC) monitored  Continue diet and meds as discussed. Further disposition pending results of labs.  HPI 63 y.o. female  presents for CPE and 3 month follow up with hypertension, hyperlipidemia, prediabetes and vitamin D.   Her blood pressure has been controlled at home, today their BP is BP: 128/76.   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   She denies chest pain, shortness of breath, dizziness.  BMI is Body mass index is 32.74 kg/m., she is working on diet and exercise. Wt Readings from Last 3 Encounters:  10/13/19 179 lb (81.2 kg)  02/09/19 179 lb 12.8 oz (81.6 kg)  10/06/18 181 lb 6.4 oz (82.3 kg)   She has hypertensive CKD stage 2, she is on  HCTZ. Lab Results  Component Value Date   [id number] 02/09/2019   She has IBS-C is on linzess, states the 75 was working, then she moved to the 145 and now she is still having round pellets.    She is on cholesterol medication, zetia and denies myalgias. Her cholesterol is at goal. The cholesterol last visit was:   Lab Results  Component Value Date   CHOL 246 (H) 02/09/2019   HDL 68 02/09/2019   LDLCALC 159 (H) 02/09/2019   TRIG 89 02/09/2019   CHOLHDL 3.6 02/09/2019    She has been working on diet and exercise for prediabetes, and denies foot ulcerations, hyperglycemia, hypoglycemia , increased appetite, nausea, paresthesia of the feet, polydipsia, polyuria, visual disturbances, vomiting and weight loss. Last A1C in the office was:  Lab Results  Component Value Date   HGBA1C 5.7 (H) 10/06/2018   Patient is on Vitamin D supplement, 8000 a day.   Lab Results  Component Value Date   VD25OH 83 10/06/2018    Current Medications:  Current Outpatient Medications on File Prior to Visit  Medication Sig Dispense Refill  . Cholecalciferol (VITAMIN D3) 2000 UNITS capsule Take 5,000 mg by mouth.    . ezetimibe (ZETIA) 10 MG tablet Take 1 tablet Daily for Cholesterol 90 tablet 3  . famotidine (PEPCID) 40 MG tablet Take 1 tablet PRN BID for reflux 180 tablet 1  . fluticasone (FLONASE) 50 MCG/ACT nasal spray Place 2 sprays into both nostrils  daily. 48 g 1  . hydrochlorothiazide (HYDRODIURIL) 25 MG tablet Take 1 tablet Daily for BP & Fluid 90 tablet 3  . linaclotide (LINZESS) 145 MCG CAPS capsule Linzess 145 mcg capsule  Take 1 capsule every day by oral route.     No current facility-administered medications on file prior to visit.     Medical History:  Past Medical History:  Diagnosis Date  . Acid reflux   . Arthritis   . Cancer ([place])    Left Breast  . Complication of anesthesia    woke  up X 1  . Fatty liver   . High cholesterol   . Hypertension   . Vitamin D deficiency     Immunization History  Administered Date(s) Administered  . Influenza Inj Mdck Quad With Preservative 10/06/2018, 10/13/2019  . Influenza Split 08/29/2013, 10/16/2015  . Influenza, High Dose Seasonal PF 09/29/2017  . Influenza, Seasonal, Injecte, Preservative Fre 11/02/2014  . Influenza,inj,quad, With Preservative 09/24/2016  . PPD Test 03/14/2014, 07/11/2015  . Pneumococcal Polysaccharide-23 05/22/2003  . Td 05/28/2005  . Tdap 07/11/2015  . Zoster 02/26/2016   Flu TODAY Tetanus: 2016 Pneumovax: 2004 Flu vaccine: 2019 Zostavax: 2017  MGM: 11/2018 Colonoscopy: 2017 CT AB 2014 CXR 2018 DEXA 2007 PAP 2016 declines another  Patient Care Team: Unk Pinto, MD as PCP - General (Internal Medicine)  Allergies Allergies  Allergen Reactions  . Lipitor [Atorvastatin] Anaphylaxis and Swelling    Other reaction(s): Other (See Comments) Unknown  . Effexor [Venlafaxine]     Other reaction(s): Other (See Comments)  . Paxil [Paroxetine Hcl]     Other reaction(s): Other (See Comments)  . Wellbutrin [Bupropion]     Other reaction(s): Other (See Comments) Hives Unknown Hives    SURGICAL HISTORY She  has a past surgical history that includes TRAM (1999-2000); Knee arthroscopy (1982); Wisdom tooth extraction; Cholecystectomy (11/19/2012); Breast surgery (1995); Hernia repair; and Mastectomy (Left, 1995). FAMILY HISTORY Her family history includes Cancer in her cousin; Hyperlipidemia in her brother and mother; Hypertension in her brother and mother. SOCIAL HISTORY She  reports that she has never smoked. She has never used smokeless tobacco. She reports current alcohol use. She reports that she does not use drugs.  Review of Systems:  Review of Systems  Constitutional: Negative for chills, fever and malaise/fatigue.  HENT: Negative for congestion, ear pain and sore throat.   Respiratory: Negative for cough, shortness of breath and wheezing.   Cardiovascular: Negative for  chest pain, palpitations and leg swelling.  Gastrointestinal: Negative for blood in stool, constipation, diarrhea, heartburn, melena and nausea.  Genitourinary: Negative.   Neurological: Negative for dizziness, sensory change, loss of consciousness and headaches.  Psychiatric/Behavioral: Negative for depression. The patient is not nervous/anxious and does not have insomnia.     Physical Exam: BP 128/76   Pulse 70   Temp (!) 97.5 F (36.4 C)   Ht 5\' 2"  (1.575 m)   Wt 179 lb (81.2 kg)   LMP 06/10/2010   SpO2 98%   BMI 32.74 kg/m  Wt Readings from Last 3 Encounters:  10/13/19 179 lb (81.2 kg)  02/09/19 179 lb 12.8 oz (81.6 kg)  10/06/18 181 lb 6.4 oz (82.3 kg)    General Appearance: Well nourished well developed, in no apparent distress. Eyes: PERRLA, EOMs, conjunctiva no swelling or erythema ENT/Mouth: Ear canals normal without obstruction, swelling, erythma, discharge.  TMs normal bilaterally.  Oropharynx moist, clear, without exudate, or postoropharyngeal swelling. Neck: Supple, thyroid normal,no cervical adenopathy  Respiratory: Respiratory effort normal, Breath sounds clear A&P without rhonchi, wheeze, or rale.  No retractions, no accessory usage. Cardio: RRR with no MRGs. Brisk peripheral pulses without edema.  Abdomen: Soft, + BS,  Non tender, no guarding, rebound, hernias, masses. Musculoskeletal: Full ROM, 5/5 strength, Normal gait Skin: Warm, dry without rashes, lesions, ecchymosis.  Neuro: Awake and oriented X 3, Cranial nerves intact. Normal muscle tone, no cerebellar symptoms. Psych: Normal affect, Insight and Judgment appropriate.   EKG: IRBBB, no ST changes, PRWP  Vicie Mutters, PA-C 9:18 AM [facility]

## 2019-10-13 ENCOUNTER — Encounter: Payer: Self-pay | Admitting: Physician Assistant

## 2019-10-13 ENCOUNTER — Other Ambulatory Visit: Payer: Self-pay

## 2019-10-13 ENCOUNTER — Ambulatory Visit (INDEPENDENT_AMBULATORY_CARE_PROVIDER_SITE_OTHER): Payer: BC Managed Care – PPO | Admitting: Physician Assistant

## 2019-10-13 VITALS — BP 128/76 | HR 70 | Temp 97.5°F | Ht 62.0 in | Wt 179.0 lb

## 2019-10-13 DIAGNOSIS — C50912 Malignant neoplasm of unspecified site of left female breast: Secondary | ICD-10-CM

## 2019-10-13 DIAGNOSIS — Z79899 Other long term (current) drug therapy: Secondary | ICD-10-CM | POA: Diagnosis not present

## 2019-10-13 DIAGNOSIS — Z136 Encounter for screening for cardiovascular disorders: Secondary | ICD-10-CM | POA: Diagnosis not present

## 2019-10-13 DIAGNOSIS — Z0000 Encounter for general adult medical examination without abnormal findings: Secondary | ICD-10-CM

## 2019-10-13 DIAGNOSIS — Z1329 Encounter for screening for other suspected endocrine disorder: Secondary | ICD-10-CM | POA: Diagnosis not present

## 2019-10-13 DIAGNOSIS — K760 Fatty (change of) liver, not elsewhere classified: Secondary | ICD-10-CM

## 2019-10-13 DIAGNOSIS — N182 Chronic kidney disease, stage 2 (mild): Secondary | ICD-10-CM

## 2019-10-13 DIAGNOSIS — Z1389 Encounter for screening for other disorder: Secondary | ICD-10-CM | POA: Diagnosis not present

## 2019-10-13 DIAGNOSIS — I10 Essential (primary) hypertension: Secondary | ICD-10-CM | POA: Diagnosis not present

## 2019-10-13 DIAGNOSIS — R7309 Other abnormal glucose: Secondary | ICD-10-CM

## 2019-10-13 DIAGNOSIS — K5900 Constipation, unspecified: Secondary | ICD-10-CM

## 2019-10-13 DIAGNOSIS — Z13220 Encounter for screening for lipoid disorders: Secondary | ICD-10-CM | POA: Diagnosis not present

## 2019-10-13 DIAGNOSIS — Z23 Encounter for immunization: Secondary | ICD-10-CM

## 2019-10-13 DIAGNOSIS — K219 Gastro-esophageal reflux disease without esophagitis: Secondary | ICD-10-CM

## 2019-10-13 DIAGNOSIS — Z0001 Encounter for general adult medical examination with abnormal findings: Secondary | ICD-10-CM

## 2019-10-13 DIAGNOSIS — Z130 Encounter for screening for diseases of the blood and blood-forming organs and certain disorders involving the immune mechanism: Secondary | ICD-10-CM | POA: Diagnosis not present

## 2019-10-13 DIAGNOSIS — E782 Mixed hyperlipidemia: Secondary | ICD-10-CM

## 2019-10-13 DIAGNOSIS — Z131 Encounter for screening for diabetes mellitus: Secondary | ICD-10-CM | POA: Diagnosis not present

## 2019-10-13 DIAGNOSIS — E559 Vitamin D deficiency, unspecified: Secondary | ICD-10-CM

## 2019-10-13 NOTE — Patient Instructions (Addendum)
I would try the fluid pill every other day or every 2-3 days and see if this helps the constipation and the leg cramps   Constipation, Adult Constipation is when a person has fewer bowel movements in a week than normal, has difficulty having a bowel movement, or has stools that are dry, hard, or larger than normal. Constipation may be caused by an underlying condition. It may become worse with age if a person takes certain medicines and does not take in enough fluids. Follow these instructions at home: Eating and drinking   Eat foods that have a lot of fiber, such as fresh fruits and vegetables, whole grains, and beans.  Limit foods that are high in fat, low in fiber, or overly processed, such as french fries, hamburgers, cookies, candies, and soda.  Drink enough fluid to keep your urine clear or pale yellow. General instructions  Exercise regularly or as told by your health care provider.  Go to the restroom when you have the urge to go. Do not hold it in.  Take over-the-counter and prescription medicines only as told by your health care provider. These include any fiber supplements.  Practice pelvic floor retraining exercises, such as deep breathing while relaxing the lower abdomen and pelvic floor relaxation during bowel movements.  Watch your condition for any changes.  Keep all follow-up visits as told by your health care provider. This is important. Contact a health care provider if:  You have pain that gets worse.  You have a fever.  You do not have a bowel movement after 4 days.  You vomit.  You are not hungry.  You lose weight.  You are bleeding from the anus.  You have thin, pencil-like stools. Get help right away if:  You have a fever and your symptoms suddenly get worse.  You leak stool or have blood in your stool.  Your abdomen is bloated.  You have severe pain in your abdomen.  You feel dizzy or you faint. This information is not intended to  replace advice given to you by your health care provider. Make sure you discuss any questions you have with your health care provider. Document Released: 09/12/2004 Document Revised: 11/27/2017 Document Reviewed: 06/04/2016 Elsevier Patient Education  2020 [place] Varicose veins are veins that have become enlarged and twisted. CAUSES This condition is the result of valves in the veins not working properly. Valves in the veins help return blood from the leg to the heart. When your calf muscles squeeze, the blood moves up your leg then the valves close and this continues until the blood gets back to your heart.  If these valves are damaged, blood flows backwards and backs up into the veins in the leg near the skin OR if your are sitting/standing for a long time without using your calf muscles the blood will back up into the veins in your legs. This causes the veins to become larger. People who are on their feet a lot, sit a lot without walking (like on a plane, at a desk, or in a car), who are pregnant, or who are overweight are more likely to develop varicose veins. SYMPTOMS   Bulging, twisted-appearing, bluish veins, most commonly found on the legs.  Leg pain or a feeling of heaviness. These symptoms may be worse at the end of the day.  Leg swelling.  Skin color changes. DIAGNOSIS  Varicose veins can usually be diagnosed with an exam of your legs by  your caregiver. He or she may recommend an ultrasound of your leg veins. TREATMENT  Most varicose veins can be treated at home. However, other treatments are available for people who have persistent symptoms or who want to treat the cosmetic appearance of the varicose veins. But this is only cosmetic and they will return if not properly treated. These include:  Laser treatment of very small varicose veins.  Medicine that is shot (injected) into the vein. This medicine hardens the walls of the vein and closes off the vein.  This treatment is called sclerotherapy. Afterwards, you may need to wear clothing or bandages that apply pressure.  Surgery. HOME CARE INSTRUCTIONS   Do not stand or sit in one position for long periods of time. Do not sit with your legs crossed. Rest with your legs raised during the day.  Your legs have to be higher than your heart so that gravity will force the valves to open, so please really elevate your legs.   Wear elastic stockings or support hose. Do not wear other tight, encircling garments around the legs, pelvis, or waist.  [employer]  has a wide variety of well priced compression stockings. [place], [place] [address] [phone]  OR THERE ARE COPPER INFUSED COMPRESSION SOCKS AT [facility] OR [facility]  [employer] also has great cheap/afforable stockings or socks- the socks are easier to get on your feet  - can also get a jacob's donner that helps you put on the sock  Walk as much as possible to increase blood flow.  Raise the foot of your bed at night with 2-inch blocks.  If you get a cut in the skin over the vein and the vein bleeds, lie down with your leg raised and press on it with a clean cloth until the bleeding stops. Then place a bandage (dressing) on the cut. See your caregiver if it continues to bleed or needs stitches. SEEK MEDICAL CARE IF:   The skin around your ankle starts to break down.  You have pain, redness, tenderness, or hard swelling developing in your leg over a vein.  You are uncomfortable due to leg pain. Document Released: 09/24/2005 Document Revised: 03/08/2012 Document Reviewed: 02/10/2011 [facility] Patient Information 2014 [place].  Ask insurance and pharmacy about shingrix - new vaccine   Can go to [email or web address] for more information  Shingrix Vaccination  Two vaccines are licensed and recommended to prevent shingles in the [place] Zoster vaccine live (ZVL, Zostavax)  has been in use since 2006. Recombinant zoster vaccine (RZV, Shingrix), has been in use since 2017 and is recommended by ACIP as the preferred shingles vaccine.  What Everyone Should Know about Shingles Vaccine (Shingrix) One of the Recommended Vaccines by Disease Shingles vaccination is the only way to protect against shingles and postherpetic neuralgia (PHN), the most common complication from shingles. CDC recommends that healthy adults 50 years and older get two doses of the shingles vaccine called Shingrix (recombinant zoster vaccine), separated by 2 to 6 months, to prevent shingles and the complications from the disease. Your doctor or pharmacist can give you Shingrix as a shot in your upper arm. Shingrix provides strong protection against shingles and PHN. Two doses of Shingrix is more than 90% effective at preventing shingles and PHN. Protection stays above 85% for at least the first four years after you get vaccinated. Shingrix is the preferred vaccine, over Zostavax (zoster vaccine live), a shingles vaccine in use since 2006. Zostavax may still be  used to prevent shingles in healthy adults 60 years and older. For example, you could use Zostavax if a person is allergic to Shingrix, prefers Zostavax, or requests immediate vaccination and Shingrix is unavailable. Who Should Get Shingrix? Healthy adults 50 years and older should get two doses of Shingrix, separated by 2 to 6 months. You should get Shingrix even if in the past you . had shingles  . received Zostavax  . are not sure if you had chickenpox There is no maximum age for getting Shingrix. If you had shingles in the past, you can get Shingrix to help prevent future occurrences of the disease. There is no specific length of time that you need to wait after having shingles before you can receive Shingrix, but generally you should make sure the shingles rash has gone away before getting vaccinated. You can get Shingrix whether or not you  remember having had chickenpox in the past. Studies show that more than 99% of Americans 40 years and older have had chickenpox, even if they don't remember having the disease. Chickenpox and shingles are related because they are caused by the same virus (varicella zoster virus). After a person recovers from chickenpox, the virus stays dormant (inactive) in the body. It can reactivate years later and cause shingles. If you had Zostavax in the recent past, you should wait at least eight weeks before getting Shingrix. Talk to your healthcare provider to determine the best time to get Shingrix. Shingrix is available in [employer] and pharmacies. To find doctor's offices or pharmacies near you that offer the vaccine, visit HealthMap Vaccine FinderExternal. If you have questions about Shingrix, talk with your healthcare provider. Vaccine for Those 54 Years and Older  Shingrix reduces the risk of shingles and PHN by more than 90% in people 60 and older. CDC recommends the vaccine for healthy adults 61 and older.  Who Should Not Get Shingrix? You should not get Shingrix if you: . have ever had a severe allergic reaction to any component of the vaccine or after a dose of Shingrix  . tested negative for immunity to varicella zoster virus. If you test negative, you should get chickenpox vaccine.  . currently have shingles  . currently are pregnant or breastfeeding. Women who are pregnant or breastfeeding should wait to get Shingrix.  Marland Kitchen receive specific antiviral drugs (acyclovir, famciclovir, or valacyclovir) 24 hours before vaccination (avoid use of these antiviral drugs for 14 days after vaccination)- zoster vaccine live only If you have a minor acute (starts suddenly) illness, such as a cold, you may get Shingrix. But if you have a moderate or severe acute illness, you should usually wait until you recover before getting the vaccine. This includes anyone with a temperature of 101.75F or higher. The  side effects of the Shingrix are temporary, and usually last 2 to 3 days. While you may experience pain for a few days after getting Shingrix, the pain will be less severe than having shingles and the complications from the disease. How Well Does Shingrix Work? Two doses of Shingrix provides strong protection against shingles and postherpetic neuralgia (PHN), the most common complication of shingles. . In adults 25 to 64 years old who got two doses, Shingrix was 97% effective in preventing shingles; among adults 70 years and older, Shingrix was 91% effective.  . In adults 7 to 65 years old who got two doses, Shingrix was 91% effective in preventing PHN; among adults 70 years and older, Shingrix was 89% effective.  Shingrix protection remained high (more than 85%) in people 70 years and older throughout the four years following vaccination. Since your risk of shingles and PHN increases as you get older, it is important to have strong protection against shingles in your older years. Top of Page  What Are the Possible Side Effects of Shingrix? Studies show that Shingrix is safe. The vaccine helps your body create a strong defense against shingles. As a result, you are likely to have temporary side effects from getting the shots. The side effects may affect your ability to do normal daily activities for 2 to 3 days. Most people got a sore arm with mild or moderate pain after getting Shingrix, and some also had redness and swelling where they got the shot. Some people felt tired, had muscle pain, a headache, shivering, fever, stomach pain, or nausea. About 1 out of 6 people who got Shingrix experienced side effects that prevented them from doing regular activities. Symptoms went away on their own in about 2 to 3 days. Side effects were more common in younger people. You might have a reaction to the first or second dose of Shingrix, or both doses. If you experience side effects, you may choose to take  over-the-counter pain medicine such as ibuprofen or acetaminophen. If you experience side effects from Shingrix, you should report them to the Vaccine Adverse Event Reporting System (VAERS). Your doctor might file this report, or you can do it yourself through the VAERS websiteExternal, or by calling [phone]. If you have any questions about side effects from Shingrix, talk with your doctor. The shingles vaccine does not contain thimerosal (a preservative containing mercury). Top of Page  When Should I See a Doctor Because of the Side Effects I Experience From Shingrix? In clinical trials, Shingrix was not associated with serious adverse events. In fact, serious side effects from vaccines are extremely rare. For example, for every 1 million doses of a vaccine given, only one or two people may have a severe allergic reaction. Signs of an allergic reaction happen within minutes or hours after vaccination and include hives, swelling of the face and throat, difficulty breathing, a fast heartbeat, dizziness, or weakness. If you experience these or any other life-threatening symptoms, see a doctor right away. Shingrix causes a strong response in your immune system, so it may produce short-term side effects more intense than you are used to from other vaccines. These side effects can be uncomfortable, but they are expected and usually go away on their own in 2 or 3 days. Top of Page  How Can I Pay For Shingrix? There are several ways shingles vaccine may be paid for: Medicare . Medicare Part D plans cover the shingles vaccine, but there may be a cost to you depending on your plan. There may be a copay for the vaccine, or you may need to pay in full then get reimbursed for a certain amount.  . Medicare Part B does not cover the shingles vaccine. Medicaid . Medicaid may or may not cover the vaccine. Contact your insurer to find out. Private health insurance . Many private health insurance plans will  cover the vaccine, but there may be a cost to you depending on your plan. Contact your insurer to find out. Vaccine assistance programs . Some pharmaceutical companies provide vaccines to eligible adults who cannot afford them. You may want to check with the vaccine manufacturer, [employer], about Shingrix. If you do not currently have health insurance, learn more about  affordable health coverage optionsExternal. To find doctor's offices or pharmacies near you that offer the vaccine, visit HealthMap Vaccine FinderExternal.

## 2019-10-14 LAB — COMPLETE METABOLIC PANEL WITH GFR
AG Ratio: 1.8 (calc) (ref 1.0–2.5)
ALT: 15 U/L (ref 6–29)
AST: 14 U/L (ref 10–35)
Albumin: 4.5 g/dL (ref 3.6–5.1)
Alkaline phosphatase (APISO): 48 U/L (ref 37–153)
BUN: 17 mg/dL (ref 7–25)
CO2: 27 mmol/L (ref 20–32)
Calcium: 10 mg/dL (ref 8.6–10.4)
Chloride: 101 mmol/L (ref 98–110)
Creat: 0.9 mg/dL (ref 0.50–0.99)
GFR, Est African American: 78 mL/min/{1.73_m2} (ref >=60)
GFR, Est Non African American: 68 mL/min/{1.73_m2} (ref >=60)
Globulin: 2.5 g/dL (calc) (ref 1.9–3.7)
Glucose, Bld: 97 mg/dL (ref 65–99)
Potassium: 3.7 mmol/L (ref 3.5–5.3)
Sodium: 138 mmol/L (ref 135–146)
Total Bilirubin: 0.5 mg/dL (ref 0.2–1.2)
Total Protein: 7 g/dL (ref 6.1–8.1)

## 2019-10-14 LAB — URINALYSIS, ROUTINE W REFLEX MICROSCOPIC
Bilirubin Urine: NEGATIVE
Glucose, UA: NEGATIVE
Hgb urine dipstick: NEGATIVE
Ketones, ur: NEGATIVE
Leukocytes,Ua: NEGATIVE
Nitrite: NEGATIVE
Protein, ur: NEGATIVE
Specific Gravity, Urine: 1.005 (ref 1.001–1.03)
pH: 7 (ref 5.0–8.0)

## 2019-10-14 LAB — CBC WITH DIFFERENTIAL/PLATELET
Absolute Monocytes: 461 cells/uL (ref 200–950)
Basophils Absolute: 38 cells/uL (ref 0–200)
Basophils Relative: 0.6 %
Eosinophils Absolute: 83 cells/uL (ref 15–500)
Eosinophils Relative: 1.3 %
HCT: 36.4 % (ref 35.0–45.0)
Hemoglobin: 12.3 g/dL (ref 11.7–15.5)
Lymphs Abs: 1709 cells/uL (ref 850–3900)
MCH: 30.4 pg (ref 27.0–33.0)
MCHC: 33.8 g/dL (ref 32.0–36.0)
MCV: 90.1 fL (ref 80.0–100.0)
MPV: 11.2 fL (ref 7.5–12.5)
Monocytes Relative: 7.2 %
Neutro Abs: 4109 cells/uL (ref 1500–7800)
Neutrophils Relative %: 64.2 %
Platelets: 215 10*3/uL (ref 140–400)
RBC: 4.04 10*6/uL (ref 3.80–5.10)
RDW: 13 % (ref 11.0–15.0)
Total Lymphocyte: 26.7 %
WBC: 6.4 10*3/uL (ref 3.8–10.8)

## 2019-10-14 LAB — HEMOGLOBIN A1C
Hgb A1c MFr Bld: 5.8 % of total Hgb — ABNORMAL HIGH (ref <5.7)
Mean Plasma Glucose: 120 (calc)
eAG (mmol/L): 6.6 (calc)

## 2019-10-14 LAB — MAGNESIUM: Magnesium: 2.1 mg/dL (ref 1.5–2.5)

## 2019-10-14 LAB — LIPID PANEL
Cholesterol: 200 mg/dL — ABNORMAL HIGH (ref <200)
HDL: 62 mg/dL (ref >=50)
LDL Cholesterol (Calc): 121 mg/dL (calc) — ABNORMAL HIGH
Non-HDL Cholesterol (Calc): 138 mg/dL (calc) — ABNORMAL HIGH (ref <130)
Total CHOL/HDL Ratio: 3.2 (calc) (ref <5.0)
Triglycerides: 77 mg/dL (ref <150)

## 2019-10-14 LAB — VITAMIN D 25 HYDROXY (VIT D DEFICIENCY, FRACTURES): Vit D, 25-Hydroxy: 91 ng/mL (ref 30–100)

## 2019-10-14 LAB — IRON, TOTAL/TOTAL IRON BINDING CAP
%SAT: 17 % (calc) (ref 16–45)
Iron: 64 ug/dL (ref 45–160)
TIBC: 377 mcg/dL (calc) (ref 250–450)

## 2019-10-14 LAB — TSH: TSH: 1.08 mIU/L (ref 0.40–4.50)

## 2019-10-14 LAB — MICROALBUMIN / CREATININE URINE RATIO
Creatinine, Urine: 28 mg/dL (ref 20–275)
Microalb Creat Ratio: 18 mcg/mg creat (ref <30)
Microalb, Ur: 0.5 mg/dL

## 2019-11-12 ENCOUNTER — Other Ambulatory Visit: Payer: Self-pay | Admitting: Physician Assistant

## 2019-12-27 ENCOUNTER — Other Ambulatory Visit: Payer: Self-pay | Admitting: Physician Assistant

## 2019-12-27 DIAGNOSIS — J0100 Acute maxillary sinusitis, unspecified: Secondary | ICD-10-CM

## 2020-01-19 DIAGNOSIS — H2513 Age-related nuclear cataract, bilateral: Secondary | ICD-10-CM | POA: Diagnosis not present

## 2020-01-19 DIAGNOSIS — H25013 Cortical age-related cataract, bilateral: Secondary | ICD-10-CM | POA: Diagnosis not present

## 2020-01-19 DIAGNOSIS — H40053 Ocular hypertension, bilateral: Secondary | ICD-10-CM | POA: Diagnosis not present

## 2020-01-19 DIAGNOSIS — H35363 Drusen (degenerative) of macula, bilateral: Secondary | ICD-10-CM | POA: Diagnosis not present

## 2020-01-19 LAB — HM DIABETES EYE EXAM

## 2020-01-30 ENCOUNTER — Encounter: Payer: Self-pay | Admitting: Internal Medicine

## 2020-02-05 MED ORDER — CYCLOBENZAPRINE HCL 10 MG PO TABS: 10.0000 mg | ORAL_TABLET | Freq: Three times a day (TID) | ORAL | 0 refills | Status: DC | PRN

## 2020-02-13 ENCOUNTER — Ambulatory Visit: Payer: BC Managed Care – PPO | Admitting: Physician Assistant

## 2020-02-28 NOTE — Progress Notes (Signed)
Patient ID: Jenna Miller, female   DOB: [birth year], 64 y.o.   MRN: [id number]  Assessment and Plan:   Essential hypertension -     CBC with Differential/Platelet -     COMPLETE METABOLIC PANEL WITH GFR -     TSH -     DG Chest 2 View; Future - continue medications, DASH diet, exercise and monitor at home. Call if greater than 130/80.   Hyperlipidemia -     Lipid panel check lipids decrease fatty foods increase activity.   CKD Stage 2 (GFR 69 ml/min) -     COMPLETE METABOLIC PANEL WITH GFR Increase fluids, avoid NSAIDS, monitor sugars, will monitor  Medication management -     Magnesium  Vitamin D deficiency -     VITAMIN D 25 Hydroxy (Vit-D Deficiency, Fractures)  Morbid Obesity (BMI 32.54) - follow up 3 months for progress monitoring - increase veggies, decrease carbs - long discussion about weight loss, diet, and exercise Check insulin  Abnormal glucose -     Hemoglobin A1c -     Insulin, random Discussed disease progression and risks Discussed diet/exercise, weight management and risk modification  Screening, anemia, deficiency, iron -     Iron,Total/Total Iron Binding Cap -     Ferritin  Acute pain of left shoulder -    Declines referral at this time- suggest follow up for shoulder OA/tendonitis- Ambulatory referral to Orthopedics ? Component of lymphadema, will monitor  Continue diet and meds as discussed. Further disposition pending results of labs. Future Appointments  Date Time Provider [place]  10/16/2020  9:00 AM Vicie Mutters, PA-C [id number] None    HPI 64 y.o. female  presents for 3 month follow up with hypertension, hyperlipidemia, prediabetes and vitamin D.  Recently saw Dr. Talmage Nap courtney for chronic constipation.  She complains of left shoulder pain, starts at posterior neck and go to her left shoulder to mid arm, she has some supraclavicular  BMI is Body mass index is 34.02 kg/m., she states she has been working from  home but she is back in the office and not going to the gym. Has not taken her HCTZ in a week. She has increased her water to 70-80 oz a day. She states she is a morning person but will crash around 3-4, she will go to bed at 9-930.  Wt Readings from Last 3 Encounters:  02/29/20 186 lb (84.4 kg)  10/13/19 179 lb (81.2 kg)  02/09/19 179 lb 12.8 oz (81.6 kg)   Lab Results  Component Value Date   IRON 64 10/13/2019   TIBC 377 10/13/2019   FERRITIN 85 06/03/2016   Lab Results  Component Value Date   VITAMINB12 >2,000 (H) 10/06/2018    Her blood pressure has been controlled at home, she is only on HCTZ every day, today their BP is BP: 116/74.   She does not workout.  She denies chest pain, shortness of breath, dizziness.   She has hypertensive CKD stage 2 Lab Results  Component Value Date   GFRNONAA 68 10/13/2019    She is on cholesterol medication, zetia and denies myalgias. Her cholesterol is at goal. The cholesterol last visit was:   Lab Results  Component Value Date   CHOL 200 (H) 10/13/2019   HDL 62 10/13/2019   LDLCALC 121 (H) 10/13/2019   TRIG 77 10/13/2019   CHOLHDL 3.2 10/13/2019    She has been working on diet and exercise for prediabetes, and denies foot ulcerations, hyperglycemia,  hypoglycemia , increased appetite, nausea, paresthesia of the feet, polydipsia, polyuria, visual disturbances, vomiting and weight loss. Last A1C in the office was:  Lab Results  Component Value Date   HGBA1C 5.8 (H) 10/13/2019   Patient is on Vitamin D supplement.  Lab Results  Component Value Date   VD25OH 91 10/13/2019      Current Medications:  Current Outpatient Medications on File Prior to Visit  Medication Sig Dispense Refill  . Cholecalciferol (VITAMIN D3) 2000 UNITS capsule Take 5,000 mg by mouth.    . cyclobenzaprine (FLEXERIL) 10 MG tablet Take 1 tablet (10 mg total) by mouth 3 (three) times daily as needed for muscle spasms. 30 tablet 0  . ezetimibe (ZETIA) 10 MG tablet  Take 1 tablet Daily for Cholesterol 90 tablet 3  . famotidine (PEPCID) 40 MG tablet Take 1 tablet 2 x /day for Acid Indigestion & Reflux 180 tablet 3  . fluticasone (FLONASE) 50 MCG/ACT nasal spray SPRAY 2 SPRAYS INTO EACH NOSTRIL EVERY DAY 48 mL 1  . hydrochlorothiazide (HYDRODIURIL) 25 MG tablet Take 1 tablet Daily for BP & Fluid 90 tablet 3   No current facility-administered medications on file prior to visit.    Medical History:  Past Medical History:  Diagnosis Date  . Acid reflux   . Arthritis   . Cancer ([place])    Left Breast  . Complication of anesthesia    woke  up X 1  . Fatty liver   . High cholesterol   . Hypertension   . Vitamin D deficiency     Allergies:  Allergies  Allergen Reactions  . Lipitor [Atorvastatin] Anaphylaxis and Swelling    Other reaction(s): Other (See Comments) Unknown  . Effexor [Venlafaxine]     Other reaction(s): Other (See Comments)  . Paxil [Paroxetine Hcl]     Other reaction(s): Other (See Comments)  . Wellbutrin [Bupropion]     Other reaction(s): Other (See Comments) Hives Unknown Hives     Review of Systems:  Review of Systems  Constitutional: Negative for chills, fever and malaise/fatigue.  HENT: Negative for congestion, ear pain and sore throat.   Respiratory: Negative for cough, shortness of breath and wheezing.   Cardiovascular: Negative for chest pain, palpitations and leg swelling.  Gastrointestinal: Negative for blood in stool, constipation, diarrhea, heartburn, melena and nausea.  Genitourinary: Negative.   Musculoskeletal: Positive for joint pain.  Neurological: Negative for dizziness, sensory change, loss of consciousness and headaches.  Psychiatric/Behavioral: Negative for depression. The patient is not nervous/anxious and does not have insomnia.     Family history- Review and unchanged  Social history- Review and unchanged  Physical Exam: BP 116/74   Pulse 63   Temp (!) 97.3 F (36.3 C)   Wt 186 lb (84.4  kg)   LMP 06/10/2010   SpO2 96%   BMI 34.02 kg/m  Wt Readings from Last 3 Encounters:  02/29/20 186 lb (84.4 kg)  10/13/19 179 lb (81.2 kg)  02/09/19 179 lb 12.8 oz (81.6 kg)    General Appearance: Well nourished well developed, in no apparent distress. Eyes: PERRLA, EOMs, conjunctiva no swelling or erythema ENT/Mouth: Ear canals normal without obstruction, swelling, erythma, discharge.  TMs normal bilaterally.  Oropharynx moist, clear, without exudate, or postoropharyngeal swelling. Neck: Supple, thyroid normal,no cervical adenopathy. Some left supreclavicular fullness.   Respiratory: Respiratory effort normal, Breath sounds clear A&P without rhonchi, wheeze, or rale.  No retractions, no accessory usage. Cardio: RRR with no MRGs. Brisk peripheral pulses without  edema.  Abdomen: Soft, + BS,  Non tender, no guarding, rebound, hernias, masses. Musculoskeletal: Full ROM, 5/5 strength, Normal gait, pain with external and internal rotation of left shoulder, negative empty can, mild left hand swelling Skin: Warm, dry without rashes, lesions, ecchymosis.  Neuro: Awake and oriented X 3, Cranial nerves intact. Normal muscle tone, no cerebellar symptoms. Psych: Normal affect, Insight and Judgment appropriate.    Vicie Mutters, PA-C 8:47 AM [facility]

## 2020-02-29 ENCOUNTER — Other Ambulatory Visit: Payer: Self-pay

## 2020-02-29 ENCOUNTER — Ambulatory Visit
Admission: RE | Admit: 2020-02-29 | Discharge: 2020-02-29 | Disposition: A | Discharge status: Home / Self Care | Payer: BLUE CROSS/BLUE SHIELD | Source: Ambulatory Visit | Attending: Physician Assistant | Admitting: Physician Assistant

## 2020-02-29 ENCOUNTER — Ambulatory Visit: Payer: BC Managed Care – PPO | Admitting: Physician Assistant

## 2020-02-29 ENCOUNTER — Encounter: Payer: Self-pay | Admitting: Physician Assistant

## 2020-02-29 VITALS — BP 116/74 | HR 63 | Temp 97.3°F | Wt 186.0 lb

## 2020-02-29 DIAGNOSIS — E782 Mixed hyperlipidemia: Secondary | ICD-10-CM | POA: Diagnosis not present

## 2020-02-29 DIAGNOSIS — I10 Essential (primary) hypertension: Secondary | ICD-10-CM

## 2020-02-29 DIAGNOSIS — R079 Chest pain, unspecified: Secondary | ICD-10-CM | POA: Diagnosis not present

## 2020-02-29 DIAGNOSIS — N182 Chronic kidney disease, stage 2 (mild): Secondary | ICD-10-CM | POA: Diagnosis not present

## 2020-02-29 DIAGNOSIS — R7309 Other abnormal glucose: Secondary | ICD-10-CM

## 2020-02-29 DIAGNOSIS — E559 Vitamin D deficiency, unspecified: Secondary | ICD-10-CM | POA: Diagnosis not present

## 2020-02-29 DIAGNOSIS — Z130 Encounter for screening for diseases of the blood and blood-forming organs and certain disorders involving the immune mechanism: Secondary | ICD-10-CM | POA: Diagnosis not present

## 2020-02-29 DIAGNOSIS — Z79899 Other long term (current) drug therapy: Secondary | ICD-10-CM | POA: Diagnosis not present

## 2020-02-29 DIAGNOSIS — M25512 Pain in left shoulder: Secondary | ICD-10-CM

## 2020-02-29 NOTE — Patient Instructions (Addendum)
INFORMATION ABOUT YOUR XRAY [facility] Can walk into [address] for an Insurance account manager. They will have the order and take you back. You do not any paper work, I should get the result back today or tomorrow. This order is good for a year.  Can call [phone] to schedule an appointment if you wish.   I] Shoulder Exercises Ask your health care provider which exercises are safe for you. Do exercises exactly as told by your health care provider and adjust them as directed. It is normal to feel mild stretching, pulling, tightness, or discomfort as you do these exercises. Stop right away if you feel sudden pain or your pain gets worse. Do not begin these exercises until told by your health care provider. Stretching exercises External rotation and abduction This exercise is sometimes called corner stretch. This exercise rotates your arm outward (external rotation) and moves your arm out from your body (abduction). 1. Stand in a doorway with one of your feet slightly in front of the other. This is called a staggered stance. If you cannot reach your forearms to the door frame, stand facing a corner of a room. 2. Choose one of the following positions as told by your health care provider: ? Place your hands and forearms on the door frame above your head. ? Place your hands and forearms on the door frame at the height of your head. ? Place your hands on the door frame at the height of your elbows. 3. Slowly move your weight onto your front foot until you feel a stretch across your chest and in the front of your shoulders. Keep your head and chest upright and keep your abdominal muscles tight. 4. Hold for __________ seconds. 5. To release the stretch, shift your weight to your back foot. Repeat __________ times. Complete this exercise __________ times a day. Extension, standing 1. Stand and hold a broomstick, a cane, or a similar object behind your back. ? Your hands should be a little  wider than shoulder width apart. ? Your palms should face away from your back. 2. Keeping your elbows straight and your shoulder muscles relaxed, move the stick away from your body until you feel a stretch in your shoulders (extension). ? Avoid shrugging your shoulders while you move the stick. Keep your shoulder blades tucked down toward the middle of your back. 3. Hold for __________ seconds. 4. Slowly return to the starting position. Repeat __________ times. Complete this exercise __________ times a day. Range-of-motion exercises Pendulum  1. Stand near a wall or a surface that you can hold onto for balance. 2. Bend at the waist and let your left / right arm hang straight down. Use your other arm to support you. Keep your back straight and do not lock your knees. 3. Relax your left / right arm and shoulder muscles, and move your hips and your trunk so your left / right arm swings freely. Your arm should swing because of the motion of your body, not because you are using your arm or shoulder muscles. 4. Keep moving your hips and trunk so your arm swings in the following directions, as told by your health care provider: ? Side to side. ? Forward and backward. ? In clockwise and counterclockwise circles. 5. Continue each motion for __________ seconds, or for as long as told by your health care provider. 6. Slowly return to the starting position. Repeat __________ times. Complete this exercise __________ times a day. Shoulder flexion, standing  1. Stand and hold a broomstick, a cane, or a similar object. Place your hands a little more than shoulder width apart on the object. Your left / right hand should be palm up, and your other hand should be palm down. 2. Keep your elbow straight and your shoulder muscles relaxed. Push the stick up with your healthy arm to raise your left / right arm in front of your body, and then over your head until you feel a stretch in your shoulder (flexion). ? Avoid  shrugging your shoulder while you raise your arm. Keep your shoulder blade tucked down toward the middle of your back. 3. Hold for __________ seconds. 4. Slowly return to the starting position. Repeat __________ times. Complete this exercise __________ times a day. Shoulder abduction, standing 1. Stand and hold a broomstick, a cane, or a similar object. Place your hands a little more than shoulder width apart on the object. Your left / right hand should be palm up, and your other hand should be palm down. 2. Keep your elbow straight and your shoulder muscles relaxed. Push the object across your body toward your left / right side. Raise your left / right arm to the side of your body (abduction) until you feel a stretch in your shoulder. ? Do not raise your arm above shoulder height unless your health care provider tells you to do that. ? If directed, raise your arm over your head. ? Avoid shrugging your shoulder while you raise your arm. Keep your shoulder blade tucked down toward the middle of your back. 3. Hold for __________ seconds. 4. Slowly return to the starting position. Repeat __________ times. Complete this exercise __________ times a day. Internal rotation  1. Place your left / right hand behind your back, palm up. 2. Use your other hand to dangle an exercise band, a towel, or a similar object over your shoulder. Grasp the band with your left / right hand so you are holding on to both ends. 3. Gently pull up on the band until you feel a stretch in the front of your left / right shoulder. The movement of your arm toward the center of your body is called internal rotation. ? Avoid shrugging your shoulder while you raise your arm. Keep your shoulder blade tucked down toward the middle of your back. 4. Hold for __________ seconds. 5. Release the stretch by letting go of the band and lowering your hands. Repeat __________ times. Complete this exercise __________ times a day. Strengthening  exercises External rotation  1. Sit in a stable chair without armrests. 2. Secure an exercise band to a stable object at elbow height on your left / right side. 3. Place a soft object, such as a folded towel or a small pillow, between your left / right upper arm and your body to move your elbow about 4 inches (10 cm) away from your side. 4. Hold the end of the exercise band so it is tight and there is no slack. 5. Keeping your elbow pressed against the soft object, slowly move your forearm out, away from your abdomen (external rotation). Keep your body steady so only your forearm moves. 6. Hold for __________ seconds. 7. Slowly return to the starting position. Repeat __________ times. Complete this exercise __________ times a day. Shoulder abduction  1. Sit in a stable chair without armrests, or stand up. 2. Hold a __________ weight in your left / right hand, or hold an exercise band with both hands. 3.  Start with your arms straight down and your left / right palm facing in, toward your body. 4. Slowly lift your left / right hand out to your side (abduction). Do not lift your hand above shoulder height unless your health care provider tells you that this is safe. ? Keep your arms straight. ? Avoid shrugging your shoulder while you do this movement. Keep your shoulder blade tucked down toward the middle of your back. 5. Hold for __________ seconds. 6. Slowly lower your arm, and return to the starting position. Repeat __________ times. Complete this exercise __________ times a day. Shoulder extension 1. Sit in a stable chair without armrests, or stand up. 2. Secure an exercise band to a stable object in front of you so it is at shoulder height. 3. Hold one end of the exercise band in each hand. Your palms should face each other. 4. Straighten your elbows and lift your hands up to shoulder height. 5. Step back, away from the secured end of the exercise band, until the band is tight and there  is no slack. 6. Squeeze your shoulder blades together as you pull your hands down to the sides of your thighs (extension). Stop when your hands are straight down by your sides. Do not let your hands go behind your body. 7. Hold for __________ seconds. 8. Slowly return to the starting position. Repeat __________ times. Complete this exercise __________ times a day. Shoulder row 1. Sit in a stable chair without armrests, or stand up. 2. Secure an exercise band to a stable object in front of you so it is at waist height. 3. Hold one end of the exercise band in each hand. Position your palms so that your thumbs are facing the ceiling (neutral position). 4. Bend each of your elbows to a 90-degree angle (right angle) and keep your upper arms at your sides. 5. Step back until the band is tight and there is no slack. 6. Slowly pull your elbows back behind you. 7. Hold for __________ seconds. 8. Slowly return to the starting position. Repeat __________ times. Complete this exercise __________ times a day. Shoulder press-ups  1. Sit in a stable chair that has armrests. Sit upright, with your feet flat on the floor. 2. Put your hands on the armrests so your elbows are bent and your fingers are pointing forward. Your hands should be about even with the sides of your body. 3. Push down on the armrests and use your arms to lift yourself off the chair. Straighten your elbows and lift yourself up as much as you comfortably can. ? Move your shoulder blades down, and avoid letting your shoulders move up toward your ears. ? Keep your feet on the ground. As you get stronger, your feet should support less of your body weight as you lift yourself up. 4. Hold for __________ seconds. 5. Slowly lower yourself back into the chair. Repeat __________ times. Complete this exercise __________ times a day. Wall push-ups  1. Stand so you are facing a stable wall. Your feet should be about one arm-length away from the  wall. 2. Lean forward and place your palms on the wall at shoulder height. 3. Keep your feet flat on the floor as you bend your elbows and lean forward toward the wall. 4. Hold for __________ seconds. 5. Straighten your elbows to push yourself back to the starting position. Repeat __________ times. Complete this exercise __________ times a day. This information is not intended to replace advice  given to you by your health care provider. Make sure you discuss any questions you have with your health care provider. Document Revised: 04/08/2019 Document Reviewed: 01/14/2019 Elsevier Patient Education  [employer].  At this time we do not have a plan to get the COVID vaccine in our office. You can go to NightlifeExpo.[email or web address] to find out if you are eligible.  If you are eligible you can go to [email or web address]  to find a local place that is giving the shot.    [email or web address] also has all of this information if the sites above are not working for you.   There are also several waiting list that you can sign up for here are some below:  [place] go to [email or web address] to sign up for notification when additional vaccine appointments are available.   You can also sign up at [facility] for a wait list.   [facility] and [facility] have a wait list on line and you can sign up for it there.     If you have further questions or concerns about the vaccine process, please visit [email or web address]  Individuals seeking information about the vaccines and state's phased distribution plan can learn more by going to - [email or web address]   Currently, there is a hotline to call to schedule vaccination appointments as no walk-ins will be accepted.  Number: [phone].      General eating tips  What to Avoid  Avoid added sugars o Often added sugar can be found in processed foods such as many condiments, dry cereals, cakes, cookies, chips, crisps, crackers,  candies, sweetened drinks, etc.  o Read labels and AVOID/DECREASE use of foods with the following in their ingredient list: Sugar, fructose, high fructose corn syrup, sucrose, glucose, maltose, dextrose, molasses, cane sugar, brown sugar, any type of syrup, agave nectar, etc.    Avoid snacking in between meals- drink water or if you feel you need a snack, pick a high water content snack such as cucumbers, watermelon, or any veggie.   Avoid foods made with flour o If you are going to eat food made with flour, choose those made with whole-grains; and, minimize your consumption as much as is tolerable  Avoid processed foods o These foods are generally stocked in the middle of the grocery store.  o Focus on shopping on the perimeter of the grocery.  What to Include  Vegetables o GREEN LEAFY VEGETABLES: Kale, spinach, mustard greens, collard greens, cabbage, broccoli, etc. o OTHER: Asparagus, cauliflower, eggplant, carrots, peas, Brussel sprouts, tomatoes, bell peppers, zucchini, beets, cucumbers, etc.  Grains, seeds, and legumes o Beans: kidney beans, black eyed peas, garbanzo beans, black beans, pinto beans, etc. o Whole, unrefined grains: brown rice, barley, bulgur, oatmeal, etc.  Healthy fats  o Avoid highly processed fats such as vegetable oil o Examples of healthy fats: avocado, olives, virgin olive oil, dark chocolate (?72% Cocoa), nuts (peanuts, almonds, walnuts, cashews, pecans, etc.) o Please still do small amount of these healthy fats, they are dense in calories.   Low - Moderate Intake of Animal Sources of Protein o Meat sources: chicken, [place], salmon, tuna. Limit to 4 ounces of meat at one time or the size of your palm. o Consider limiting dairy sources, but when choosing dairy focus on: PLAIN [place] yogurt, cottage cheese, high-protein milk  Fruit o Choose berries

## 2020-03-01 LAB — COMPLETE METABOLIC PANEL WITH GFR
AG Ratio: 1.8 (calc) (ref 1.0–2.5)
ALT: 17 U/L (ref 6–29)
AST: 15 U/L (ref 10–35)
Albumin: 4.6 g/dL (ref 3.6–5.1)
Alkaline phosphatase (APISO): 54 U/L (ref 37–153)
BUN: 15 mg/dL (ref 7–25)
CO2: 27 mmol/L (ref 20–32)
Calcium: 9.8 mg/dL (ref 8.6–10.4)
Chloride: 104 mmol/L (ref 98–110)
Creat: 0.88 mg/dL (ref 0.50–0.99)
GFR, Est African American: 80 mL/min/{1.73_m2} (ref >=60)
GFR, Est Non African American: 69 mL/min/{1.73_m2} (ref >=60)
Globulin: 2.5 g/dL (calc) (ref 1.9–3.7)
Glucose, Bld: 92 mg/dL (ref 65–99)
Potassium: 4.1 mmol/L (ref 3.5–5.3)
Sodium: 141 mmol/L (ref 135–146)
Total Bilirubin: 0.5 mg/dL (ref 0.2–1.2)
Total Protein: 7.1 g/dL (ref 6.1–8.1)

## 2020-03-01 LAB — LIPID PANEL
Cholesterol: 210 mg/dL — ABNORMAL HIGH (ref <200)
HDL: 64 mg/dL (ref >=50)
LDL Cholesterol (Calc): 127 mg/dL (calc) — ABNORMAL HIGH
Non-HDL Cholesterol (Calc): 146 mg/dL (calc) — ABNORMAL HIGH (ref <130)
Total CHOL/HDL Ratio: 3.3 (calc) (ref <5.0)
Triglycerides: 88 mg/dL (ref <150)

## 2020-03-01 LAB — VITAMIN D 25 HYDROXY (VIT D DEFICIENCY, FRACTURES): Vit D, 25-Hydroxy: 76 ng/mL (ref 30–100)

## 2020-03-01 LAB — INSULIN, RANDOM: Insulin: 9.8 u[IU]/mL

## 2020-03-01 LAB — CBC WITH DIFFERENTIAL/PLATELET
Absolute Monocytes: 488 cells/uL (ref 200–950)
Basophils Absolute: 40 cells/uL (ref 0–200)
Basophils Relative: 0.6 %
Eosinophils Absolute: 79 cells/uL (ref 15–500)
Eosinophils Relative: 1.2 %
HCT: 35.9 % (ref 35.0–45.0)
Hemoglobin: 12.1 g/dL (ref 11.7–15.5)
Lymphs Abs: 1571 cells/uL (ref 850–3900)
MCH: 30.2 pg (ref 27.0–33.0)
MCHC: 33.7 g/dL (ref 32.0–36.0)
MCV: 89.5 fL (ref 80.0–100.0)
MPV: 10.8 fL (ref 7.5–12.5)
Monocytes Relative: 7.4 %
Neutro Abs: 4422 cells/uL (ref 1500–7800)
Neutrophils Relative %: 67 %
Platelets: 206 10*3/uL (ref 140–400)
RBC: 4.01 10*6/uL (ref 3.80–5.10)
RDW: 13 % (ref 11.0–15.0)
Total Lymphocyte: 23.8 %
WBC: 6.6 10*3/uL (ref 3.8–10.8)

## 2020-03-01 LAB — IRON, TOTAL/TOTAL IRON BINDING CAP
%SAT: 18 % (calc) (ref 16–45)
Iron: 74 ug/dL (ref 45–160)
TIBC: 412 mcg/dL (calc) (ref 250–450)

## 2020-03-01 LAB — TSH: TSH: 1.2 mIU/L (ref 0.40–4.50)

## 2020-03-01 LAB — MAGNESIUM: Magnesium: 2.1 mg/dL (ref 1.5–2.5)

## 2020-03-01 LAB — FERRITIN: Ferritin: 62 ng/mL (ref 16–288)

## 2020-03-01 LAB — HEMOGLOBIN A1C
Hgb A1c MFr Bld: 5.9 % of total Hgb — ABNORMAL HIGH (ref <5.7)
Mean Plasma Glucose: 123 (calc)
eAG (mmol/L): 6.8 (calc)

## 2020-03-05 ENCOUNTER — Encounter: Payer: Self-pay | Admitting: *Deleted

## 2020-05-03 ENCOUNTER — Other Ambulatory Visit: Payer: Self-pay | Admitting: Internal Medicine

## 2020-05-03 DIAGNOSIS — I10 Essential (primary) hypertension: Secondary | ICD-10-CM

## 2020-05-04 ENCOUNTER — Other Ambulatory Visit: Payer: Self-pay | Admitting: Internal Medicine

## 2020-05-16 ENCOUNTER — Encounter: Payer: Self-pay | Admitting: Internal Medicine

## 2020-05-19 IMAGING — DX DG HAND COMPLETE 3+V*L*
3 series · 3 of 3 positions shown · non-contrast
Comparison: None.

CLINICAL DATA: Patient states that she fell today injuring her left
wrist and left hand, brusing along metacarpals of hand and some
tenderness along the wrist. No hx .

EXAM:
LEFT HAND - COMPLETE 3+ VIEW

[hand pa]
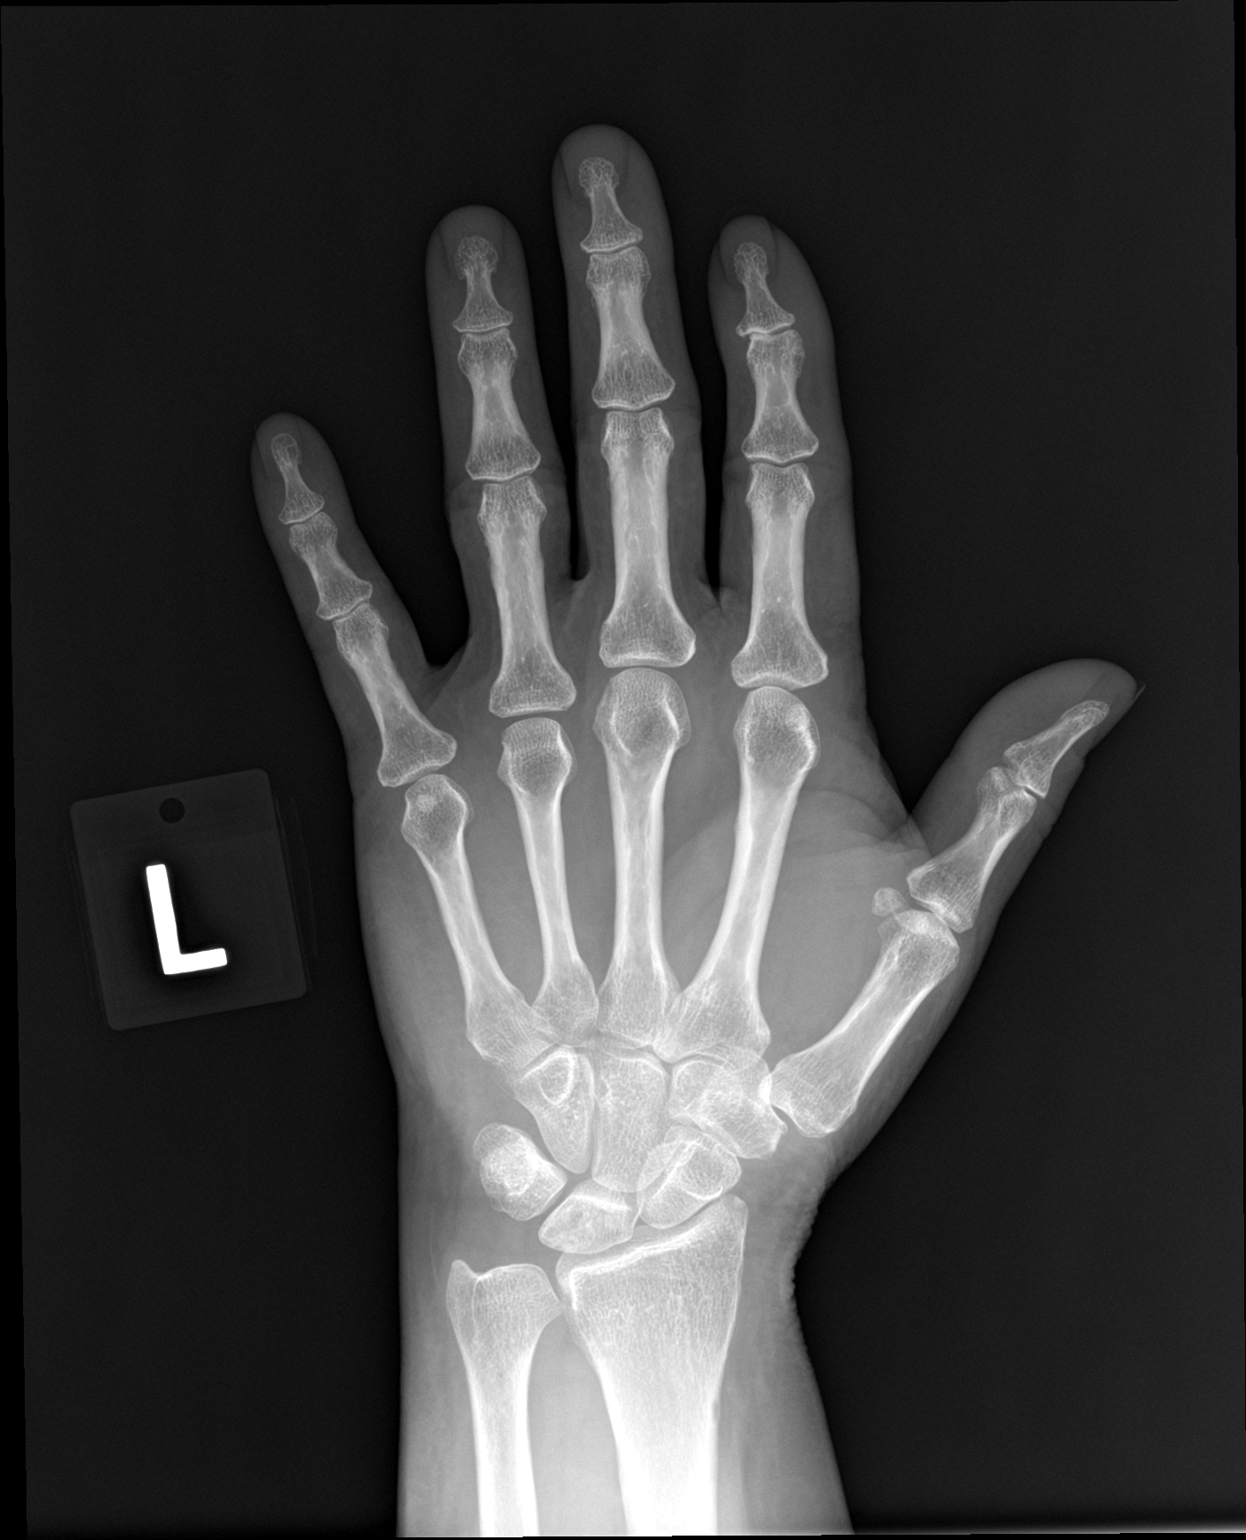

[hand obl]
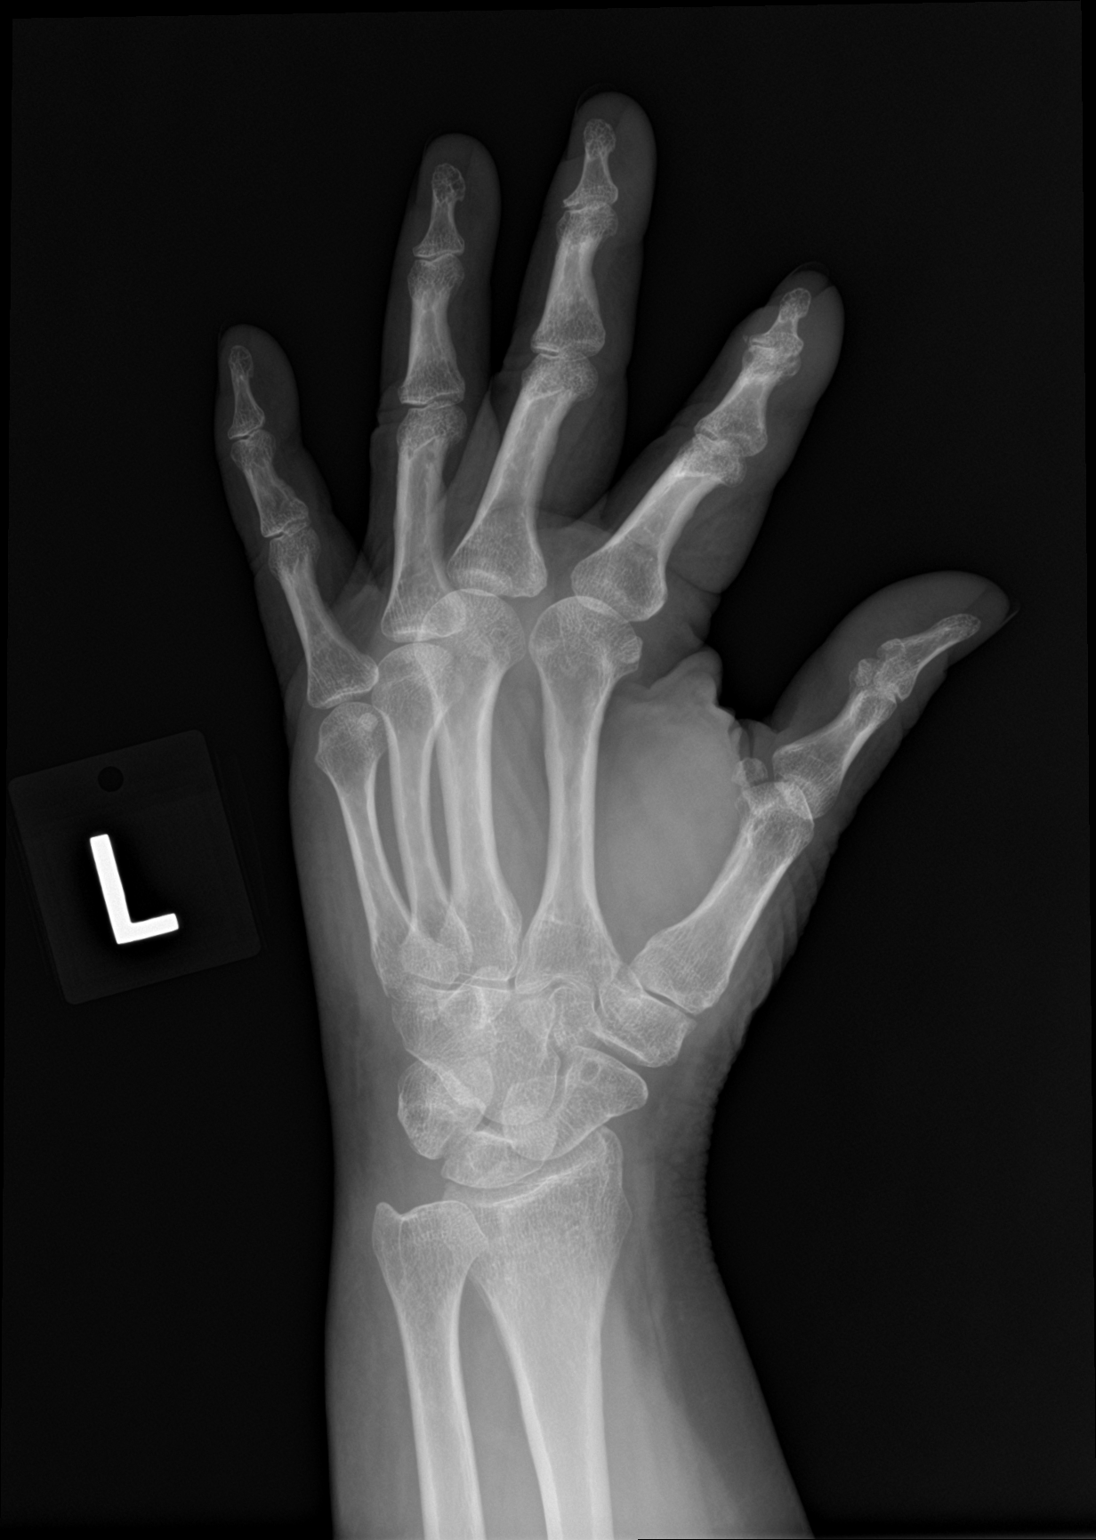

[hand lat]
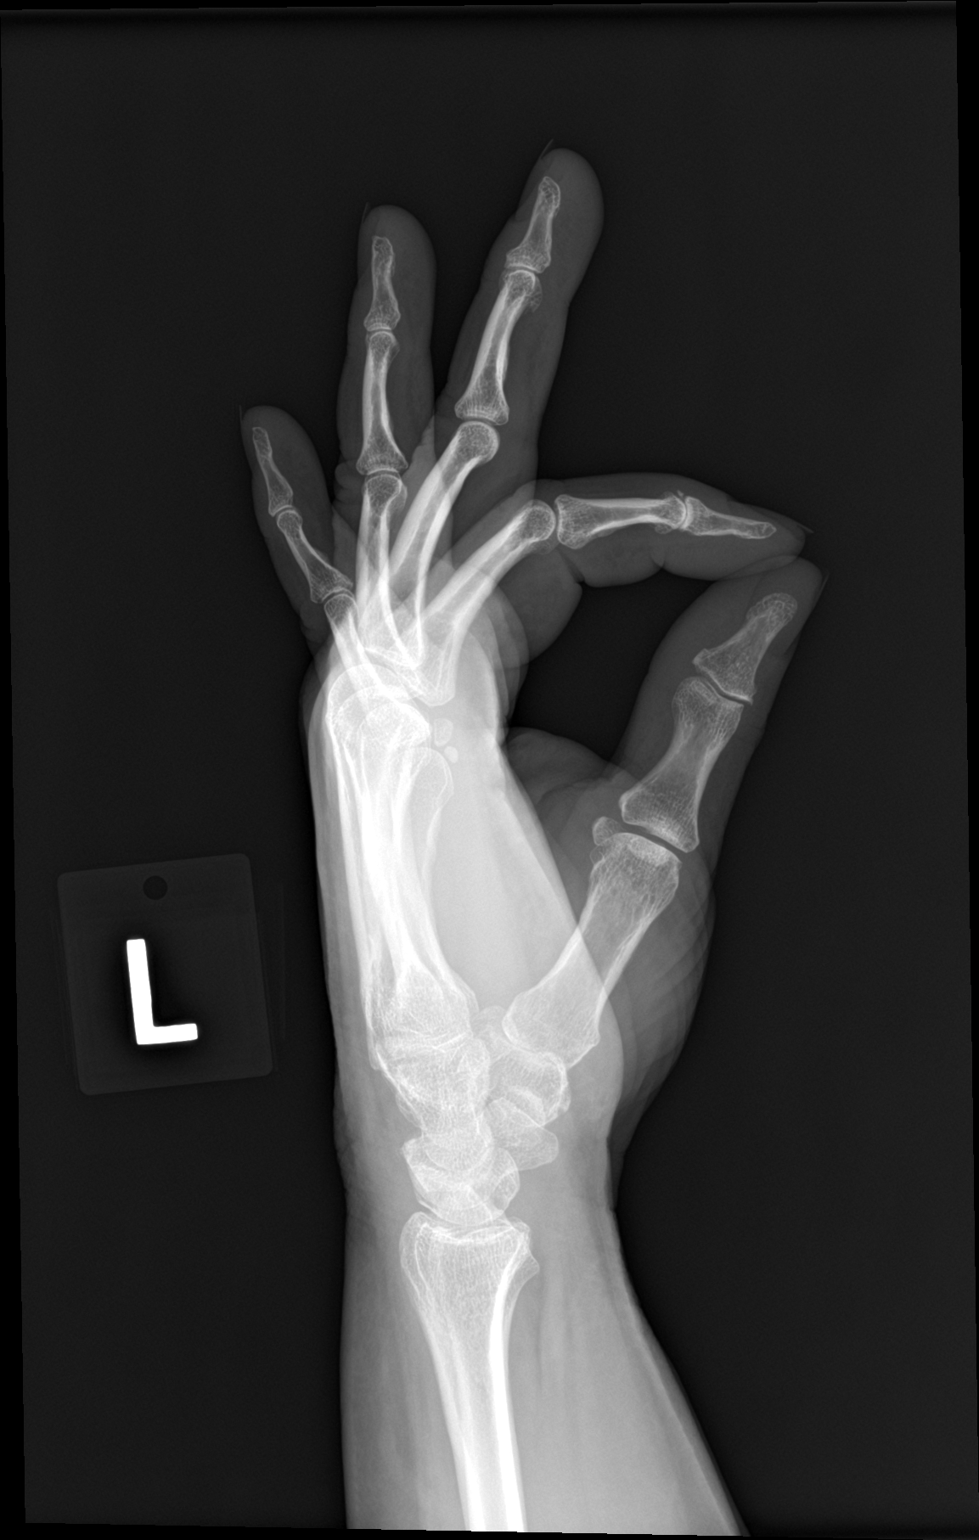

[3 of 3 positions shown; findings below may reference images not displayed]

FINDINGS: No evidence of fracture of the carpal or metacarpal bones.
Radiocarpal joint is intact. Phalanges are normal. No soft tissue
injury.
IMPRESSION: No fracture or dislocation.

## 2020-06-06 ENCOUNTER — Other Ambulatory Visit: Payer: Self-pay | Admitting: Internal Medicine

## 2020-06-06 DIAGNOSIS — Z1231 Encounter for screening mammogram for malignant neoplasm of breast: Secondary | ICD-10-CM

## 2020-06-12 ENCOUNTER — Ambulatory Visit: Payer: BC Managed Care – PPO | Admitting: Physician Assistant

## 2020-06-25 ENCOUNTER — Ambulatory Visit
Admission: RE | Admit: 2020-06-25 | Discharge: 2020-06-25 | Disposition: A | Discharge status: Home / Self Care | Payer: BLUE CROSS/BLUE SHIELD | Source: Ambulatory Visit | Attending: Internal Medicine | Admitting: Internal Medicine

## 2020-06-25 ENCOUNTER — Other Ambulatory Visit: Payer: Self-pay

## 2020-06-25 DIAGNOSIS — Z1231 Encounter for screening mammogram for malignant neoplasm of breast: Secondary | ICD-10-CM | POA: Diagnosis not present

## 2020-07-03 NOTE — Progress Notes (Signed)
Patient ID: DOMIQUE REARDON, female   DOB: 1956-06-08, 64 y.o.   MRN: [id number]  Assessment and Plan:   Essential hypertension -     CBC with Differential/Platelet -     COMPLETE METABOLIC PANEL WITH GFR -     TSH - continue medications, DASH diet, exercise and monitor at home. Call if greater than 130/80.   Hyperlipidemia -     Lipid panel check lipids decrease fatty foods increase activity.   Medication management -     Magnesium  Vitamin D deficiency -     VITAMIN D 25 Hydroxy (Vit-D Deficiency, Fractures)  Acute pain of left shoulder -     Ambulatory referral to Orthopedic Surgery -     predniSONE (DELTASONE) 20 MG tablet; 2 tablets daily for 3 days, 1 tablet daily for 4 days. -     Sedimentation rate - ? Shoulder impingement- will refer to ortho  Tinnitus of both ears with vertigo - no decreased hearing, normal neuro- no imaging needed at this time Likely inner ear, will treat with flonase and prednisone.  -     predniSONE (DELTASONE) 20 MG tablet; 2 tablets daily for 3 days, 1 tablet daily for 4 days. - follow up 1 month if not better may refer to ENT She was informed to call 911 if she develop any new symptoms such as worsening headaches, episodes of blurred vision, double vision or complete loss of vision or speech difficulties or motor weakness.  Anxiety -     ALPRAZolam (XANAX) 0.5 MG tablet; Take 1 tablet (0.5 mg total) by mouth at bedtime as needed for anxiety or sleep. Declines SSRI/SNRI  Abnormal glucose -     Hemoglobin A1c  Screening, anemia, deficiency, iron -     Iron,Total/Total Iron Binding Cap -     Ferritin -     Vitamin B12  Yeast dermatitis under bilateral breast -     triamcinolone cream (KENALOG) 0.5 %; Apply 1 application topically 2 (two) times daily. -     nystatin cream (MYCOSTATIN); Apply 1 application topically 2 (two) times daily.  Bradykinesia Per patient, during "episdoes" No tremor, no family history of parkinsons Patient  does have some mask face, ,some cog wheeling Discussed but will try current treatment, if not improved will refer to neuro She was informed to call 911 if she develop any new symptoms such as worsening headaches, episodes of blurred vision, double vision or complete loss of vision or speech difficulties or motor weakness.  Over 60 minutes of exam, counseling, chart review, and complex, high level critical decision making was performed this visit.  Continue diet and meds as discussed. Further disposition pending results of labs. Future Appointments  Date Time Provider Ramos  08/03/2020  8:45 AM Vicie Mutters, PA-C [id number] None  10/16/2020  9:00 AM Vicie Mutters, PA-C [facility] None    HPI 64 y.o. female  presents for 3 month follow up with hypertension, hyperlipidemia, prediabetes and vitamin D.  She has a multitude of symptoms since March. Getting progressively.  She feels like she is in a barrel, fullness in them, feels stopped up, with tinnitus both ears, constant. No hearing loss, no vision changes, no HA.  She has been taking the fluid pill more frequently, has been taking 1-2 x a week just this past week, no change in symptoms. Has been having allergy symptoms, only using flonase as needed due to it drying her out.   She is afraid some of  this is from the [place] vaccines but states she also got a right upper crown April 22nd.   When she is in bed or sitting she is fine but if she is walking she will have vertigo and states she is moving very slowly/stiff just with the episode. She does have some masked face. Will have some tremor with intention with left hand but that is the shoulder she is having pain in. No non intention tremor. Will have short steps when she has an episode.   She denies any associated neurological complications or symptoms, such as one-sided weakness, numbness, tingling, slurring of speech, droopy face, swallowing difficulties, diplopia, vision  loss.  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 She does snore. She will go to bed at 10:00, normally gets up at 4-5AM, has not been sleeping well, does not feel refreshed in the morning.  She continues to have left shoulder/elbow pain and has not been to orthopedics due to stress at work. Has pain in her forearm with supination, no pain at elbow, no numbness tingling in that hand. Has pain at posterior shoulder with abduction and external rotation. She also states she was not contacts.   BMI is Body mass index is 33.27 kg/m., she states she has been working from home but she is back in the office and not going to the gym. Has not taken her HCTZ in a week. She has increased her water to 70-80 oz a day.  Wt Readings from Last 3 Encounters:  07/04/20 179 lb (81.2 kg)  02/29/20 186 lb (84.4 kg)  10/13/19 179 lb (81.2 kg)   Lab Results  Component Value Date   IRON 74 02/29/2020   TIBC 412 02/29/2020   FERRITIN 62 02/29/2020   Lab Results  Component Value Date   VITAMINB12 >2,000 (H) 10/06/2018    Her blood pressure has been controlled at home, she is only on HCTZ every day, today their BP is BP: 136/64.   She does not workout.  She denies chest pain, shortness of breath, dizziness.   She has hypertensive CKD stage 2 Lab Results  Component Value Date   GFRNONAA 69 02/29/2020    She is on cholesterol medication, zetia and denies myalgias. Her cholesterol is at goal. The cholesterol last visit was:   Lab Results  Component Value Date   CHOL 210 (H) 02/29/2020   HDL 64 02/29/2020   LDLCALC 127 (H) 02/29/2020   TRIG 88 02/29/2020   CHOLHDL 3.3 02/29/2020    She has been working on diet and exercise for prediabetes, and denies foot ulcerations, hyperglycemia, hypoglycemia , increased appetite, nausea, paresthesia of the feet, polydipsia, polyuria, visual  disturbances, vomiting and weight loss. Last A1C in the office was:  Lab Results  Component Value Date   HGBA1C 5.9 (H) 02/29/2020   Patient is on Vitamin D supplement.  Lab Results  Component Value Date   VD25OH 76 02/29/2020      Current Medications:  Current Outpatient Medications on File Prior to Visit  Medication Sig Dispense Refill  . Cholecalciferol (VITAMIN D3) 2000 UNITS capsule Take 5,000 mg by mouth.    . cyclobenzaprine (FLEXERIL) 10 MG tablet Take 1 tablet (10 mg total) by mouth 3 (three) times daily as needed for muscle spasms. 30 tablet 0  . ezetimibe (ZETIA) 10 MG tablet TAKE 1 TABLET  BY MOUTH EVERY DAY FOR CHOLESTEROL 90 tablet 3  . famotidine (PEPCID) 40 MG tablet Take 1 tablet 2 x /day for Acid Indigestion & Reflux 180 tablet 3  . fluticasone (FLONASE) 50 MCG/ACT nasal spray SPRAY 2 SPRAYS INTO EACH NOSTRIL EVERY DAY 48 mL 1  . hydrochlorothiazide (HYDRODIURIL) 25 MG tablet TAKE 1 TABLET BY MOUTH EVERY DAY FOR BLOOD PRESSURE AND FLUID 90 tablet 3   No current facility-administered medications on file prior to visit.    Medical History:  Past Medical History:  Diagnosis Date  . Acid reflux   . Arthritis   . Cancer ([place])    Left Breast  . Complication of anesthesia    woke  up X 1  . Fatty liver   . High cholesterol   . Hypertension   . Vitamin D deficiency     Allergies:  Allergies  Allergen Reactions  . Lipitor [Atorvastatin] Anaphylaxis and Swelling    Other reaction(s): Other (See Comments) Unknown  . Effexor [Venlafaxine]     Other reaction(s): Other (See Comments)  . Paxil [Paroxetine Hcl]     Other reaction(s): Other (See Comments)  . Wellbutrin [Bupropion]     Other reaction(s): Other (See Comments) Hives Unknown Hives     Review of Systems:  Review of Systems  Constitutional: Negative for chills, fever and malaise/fatigue.  HENT: Negative for congestion, ear pain and sore throat.   Respiratory: Negative for cough, shortness of  breath and wheezing.   Cardiovascular: Negative for chest pain, palpitations and leg swelling.  Gastrointestinal: Negative for blood in stool, constipation, diarrhea, heartburn, melena and nausea.  Genitourinary: Negative.   Musculoskeletal: Positive for joint pain.  Neurological: Negative for dizziness, sensory change, loss of consciousness and headaches.  Psychiatric/Behavioral: Negative for depression. The patient is not nervous/anxious and does not have insomnia.     Family history- Review and unchanged  Social history- Review and unchanged  Physical Exam: BP 136/64   Pulse 73   Temp 98.1 F (36.7 C)   Resp 12   Ht 5' 1.5" (1.562 m)   Wt 179 lb (81.2 kg)   LMP 06/10/2010   SpO2 98%   BMI 33.27 kg/m  Wt Readings from Last 3 Encounters:  07/04/20 179 lb (81.2 kg)  02/29/20 186 lb (84.4 kg)  10/13/19 179 lb (81.2 kg)    General Appearance: Well nourished well developed, in no apparent distress. Eyes: PERRLA, EOMs, conjunctiva no swelling or erythema ENT/Mouth: Ear canals normal without obstruction, swelling, erythma, discharge.  TMs normal bilaterally.  Oropharynx moist, clear, without exudate, or postoropharyngeal swelling. Neck: Supple, thyroid normal,no cervical adenopathy. Some left supreclavicular fullness.   Respiratory: Respiratory effort normal, Breath sounds clear A&P without rhonchi, wheeze, or rale.  No retractions, no accessory usage. Cardio: RRR with no MRGs. Brisk peripheral pulses without edema.  Abdomen: Soft, + BS,  Non tender, no guarding, rebound, hernias, masses. Musculoskeletal: Full ROM, 5/5 strength, Normal gait, pain with external and internal rotation of left shoulder, negative empty can, mild left hand swelling Skin: Warm, dry without rashes, lesions, ecchymosis.  Neuro: Awake and oriented X 3, Cranial nerves intact. Normal muscle tone, no cerebellar symptoms. Normal finger to nose, mask face, + cog wheeling bilateral worse on left, slow getting up  from a chair, normal gait Psych: Normal affect, Insight and Judgment appropriate.    Vicie Mutters, PA-C 12:27 PM [facility]

## 2020-07-04 ENCOUNTER — Encounter: Payer: Self-pay | Admitting: Physician Assistant

## 2020-07-04 ENCOUNTER — Other Ambulatory Visit: Payer: Self-pay

## 2020-07-04 ENCOUNTER — Ambulatory Visit: Payer: BC Managed Care – PPO | Admitting: Physician Assistant

## 2020-07-04 VITALS — BP 136/64 | HR 73 | Temp 98.1°F | Resp 12 | Ht 61.5 in | Wt 179.0 lb

## 2020-07-04 DIAGNOSIS — E559 Vitamin D deficiency, unspecified: Secondary | ICD-10-CM | POA: Diagnosis not present

## 2020-07-04 DIAGNOSIS — R7309 Other abnormal glucose: Secondary | ICD-10-CM | POA: Diagnosis not present

## 2020-07-04 DIAGNOSIS — M25512 Pain in left shoulder: Secondary | ICD-10-CM

## 2020-07-04 DIAGNOSIS — I10 Essential (primary) hypertension: Secondary | ICD-10-CM | POA: Diagnosis not present

## 2020-07-04 DIAGNOSIS — R258 Other abnormal involuntary movements: Secondary | ICD-10-CM

## 2020-07-04 DIAGNOSIS — B372 Candidiasis of skin and nail: Secondary | ICD-10-CM

## 2020-07-04 DIAGNOSIS — E782 Mixed hyperlipidemia: Secondary | ICD-10-CM | POA: Diagnosis not present

## 2020-07-04 DIAGNOSIS — F419 Anxiety disorder, unspecified: Secondary | ICD-10-CM

## 2020-07-04 DIAGNOSIS — Z79899 Other long term (current) drug therapy: Secondary | ICD-10-CM

## 2020-07-04 DIAGNOSIS — Z130 Encounter for screening for diseases of the blood and blood-forming organs and certain disorders involving the immune mechanism: Secondary | ICD-10-CM

## 2020-07-04 DIAGNOSIS — R42 Dizziness and giddiness: Secondary | ICD-10-CM

## 2020-07-04 DIAGNOSIS — H9313 Tinnitus, bilateral: Secondary | ICD-10-CM

## 2020-07-04 MED ORDER — ALPRAZOLAM 0.5 MG PO TABS: 0.5000 mg | ORAL_TABLET | Freq: Every evening | ORAL | 0 refills | Status: DC | PRN

## 2020-07-04 MED ORDER — PREDNISONE 20 MG PO TABS: ORAL_TABLET | ORAL | 0 refills | Status: DC

## 2020-07-04 MED ORDER — TRIAMCINOLONE ACETONIDE 0.5 % EX CREA: 1.0000 "application " | TOPICAL_CREAM | Freq: Two times a day (BID) | CUTANEOUS | 2 refills | Status: DC

## 2020-07-04 MED ORDER — NYSTATIN 100000 UNIT/GM EX CREA: 1.0000 "application " | TOPICAL_CREAM | Freq: Two times a day (BID) | CUTANEOUS | 1 refills | Status: DC

## 2020-07-04 NOTE — Patient Instructions (Addendum)
Please take the prednisone as directed below, this is NOT an antibiotic so you do NOT have to finish it. You can take it for a few days and stop it if you are doing better. Will do quick taper.   Please take the prednisone to help decrease inflammation and therefore decrease symptoms. Take it it with food to avoid GI upset.  If you are diabetic it will increase your sugars so decrease carbs and monitor your sugars closely.     Can do a steroid nasal spary 1-2 sparys at night each nostril- USE WITH SALINE SPRAY TO KEEP NOSE MOIST Examples are nasonex, flonase, nasocort- they are over the counter.  Remember to spray each nostril twice towards the outer part of your eye.   Do not sniff but instead pinch your nose and tilt your head back to help the medicine get into your sinuses.   The best time to do this is at bedtime.  Stop if you get blurred vision or nose bleeds.   THIS WILL TAKE 7 DAYS TO WORK AND IS BETTER IF YOU START BEFORE SYMPTOMS SO IF YOU HAVE A SEASON OR TIME OF THE YEAR YOU ALWAYS GET A COLD, START BEFORE THAT!  IF YOU ARE NOT BETTER IN 2-4 WEEKS WILL REFER TO ENT  She was informed to call 911 if she develop any new symptoms such as worsening headaches, episodes of blurred vision, double vision or complete loss of vision or speech difficulties or motor weakness.    Tinnitus Tinnitus refers to hearing a sound when there is no actual source for that sound. This is often described as ringing in the ears. However, people with this condition may hear a variety of noises, in one ear or in both ears. The sounds of tinnitus can be soft, loud, or somewhere in between. Tinnitus can last for a few seconds or can be constant for days. It may go away without treatment and come back at various times. When tinnitus is constant or happens often, it can lead to other problems, such as trouble sleeping and trouble concentrating. Almost everyone experiences tinnitus at some point. Tinnitus that is  long-lasting (chronic) or comes back often (recurs) may require medical attention. What are the causes? The cause of tinnitus is often not known. In some cases, it can result from:  Exposure to loud noises from machinery, music, or other sources.  An object (foreign body) stuck in the ear.  Earwax buildup.  Drinking alcohol or caffeine.  Taking certain medicines.  Age-related hearing loss. It may also be caused by medical conditions such as:  Ear or sinus infections.  High blood pressure.  Heart diseases.  Anemia.  Allergies.  Meniere's disease.  Thyroid problems.  Tumors.  A weak, bulging blood vessel (aneurysm) near the ear. What are the signs or symptoms? The main symptom of tinnitus is hearing a sound when there is no source for that sound. It may sound like:  Buzzing.  Roaring.  Ringing.  Blowing air.  Hissing.  Whistling.  Sizzling.  Humming.  Running water.  A musical note.  Tapping. Symptoms may affect only one ear (unilateral) or both ears (bilateral). How is this diagnosed? Tinnitus is diagnosed based on your symptoms, your medical history, and a physical exam. Your health care provider may do a thorough hearing test (audiologic exam) if your tinnitus:  Is unilateral.  Causes hearing difficulties.  Lasts 6 months or longer. You may work with a health care provider who specializes  in hearing disorders (audiologist). You may be asked questions about your symptoms and how they affect your daily life. You may have other tests done, such as:  CT scan.  MRI.  An imaging test of how blood flows through your blood vessels (angiogram). How is this treated? Treating an underlying medical condition can sometimes make tinnitus go away. If your tinnitus continues, other treatments may include:  Medicines.  Therapy and counseling to help you manage the stress of living with tinnitus.  Sound generators to mask the tinnitus. These  include: ? Tabletop sound machines that play relaxing sounds to help you fall asleep. ? Wearable devices that fit in your ear and play sounds or music. ? Acoustic neural stimulation. This involves using headphones to listen to music that contains an auditory signal. Over time, listening to this signal may change some pathways in your brain and make you less sensitive to tinnitus. This treatment is used for very severe cases when no other treatment is working.  Using hearing aids or cochlear implants if your tinnitus is related to hearing loss. Hearing aids are worn in the outer ear. Cochlear implants are surgically placed in the inner ear. Follow these instructions at home: Managing symptoms      When possible, avoid being in loud places and being exposed to loud sounds.  Wear hearing protection, such as earplugs, when you are exposed to loud noises.  Use a white noise machine, a humidifier, or other devices to mask the sound of tinnitus.  Practice techniques for reducing stress, such as meditation, yoga, or deep breathing. Work with your health care provider if you need help with managing stress.  Sleep with your head slightly raised. This may reduce the impact of tinnitus. General instructions  Do not use stimulants, such as nicotine, alcohol, or caffeine. Talk with your health care provider about other stimulants to avoid. Stimulants are substances that can make you feel alert and attentive by increasing certain activities in the body (such as heart rate and blood pressure). These substances may make tinnitus worse.  Take over-the-counter and prescription medicines only as told by your health care provider.  Try to get plenty of sleep each night.  Keep all follow-up visits as told by your health care provider. This is important. Contact a health care provider if:  Your tinnitus continues for 3 weeks or longer without stopping.  You develop sudden hearing loss.  Your symptoms  get worse or do not get better with home care.  You feel you are not able to manage the stress of living with tinnitus. Get help right away if:  You develop tinnitus after a head injury.  You have tinnitus along with any of the following: ? Dizziness. ? Loss of balance. ? Nausea and vomiting. ? Sudden, severe headache. These symptoms may represent a serious problem that is an emergency. Do not wait to see if the symptoms will go away. Get medical help right away. Call your local emergency services (911 in the [place] Do not drive yourself to the hospital. Summary  Tinnitus refers to hearing a sound when there is no actual source for that sound. This is often described as ringing in the ears.  Symptoms may affect only one ear (unilateral) or both ears (bilateral).  Use a white noise machine, a humidifier, or other devices to mask the sound of tinnitus.  Do not use stimulants, such as nicotine, alcohol, or caffeine. Talk with your health care provider about other stimulants to  avoid. These substances may make tinnitus worse. This information is not intended to replace advice given to you by your health care provider. Make sure you discuss any questions you have with your health care provider. Document Revised: 06/29/2019 Document Reviewed: 09/24/2017 Elsevier Patient Education  2020 [employer].

## 2020-07-05 DIAGNOSIS — H9313 Tinnitus, bilateral: Secondary | ICD-10-CM

## 2020-07-05 LAB — COMPLETE METABOLIC PANEL WITH GFR
AG Ratio: 1.9 (calc) (ref 1.0–2.5)
ALT: 16 U/L (ref 6–29)
AST: 19 U/L (ref 10–35)
Albumin: 4.7 g/dL (ref 3.6–5.1)
Alkaline phosphatase (APISO): 51 U/L (ref 37–153)
BUN/Creatinine Ratio: 20 (calc) (ref 6–22)
BUN: 20 mg/dL (ref 7–25)
CO2: 26 mmol/L (ref 20–32)
Calcium: 10 mg/dL (ref 8.6–10.4)
Chloride: 101 mmol/L (ref 98–110)
Creat: 1.02 mg/dL — ABNORMAL HIGH (ref 0.50–0.99)
GFR, Est African American: 67 mL/min/{1.73_m2} (ref >=60)
GFR, Est Non African American: 58 mL/min/{1.73_m2} — ABNORMAL LOW (ref >=60)
Globulin: 2.5 g/dL (calc) (ref 1.9–3.7)
Glucose, Bld: 102 mg/dL — ABNORMAL HIGH (ref 65–99)
Potassium: 4.1 mmol/L (ref 3.5–5.3)
Sodium: 138 mmol/L (ref 135–146)
Total Bilirubin: 0.5 mg/dL (ref 0.2–1.2)
Total Protein: 7.2 g/dL (ref 6.1–8.1)

## 2020-07-05 LAB — CBC WITH DIFFERENTIAL/PLATELET
Absolute Monocytes: 580 cells/uL (ref 200–950)
Basophils Absolute: 28 cells/uL (ref 0–200)
Basophils Relative: 0.4 %
Eosinophils Absolute: 83 cells/uL (ref 15–500)
Eosinophils Relative: 1.2 %
HCT: 39.4 % (ref 35.0–45.0)
Hemoglobin: 13.2 g/dL (ref 11.7–15.5)
Lymphs Abs: 1760 cells/uL (ref 850–3900)
MCH: 30.3 pg (ref 27.0–33.0)
MCHC: 33.5 g/dL (ref 32.0–36.0)
MCV: 90.4 fL (ref 80.0–100.0)
MPV: 11.4 fL (ref 7.5–12.5)
Monocytes Relative: 8.4 %
Neutro Abs: 4451 cells/uL (ref 1500–7800)
Neutrophils Relative %: 64.5 %
Platelets: 214 10*3/uL (ref 140–400)
RBC: 4.36 10*6/uL (ref 3.80–5.10)
RDW: 13 % (ref 11.0–15.0)
Total Lymphocyte: 25.5 %
WBC: 6.9 10*3/uL (ref 3.8–10.8)

## 2020-07-05 LAB — TSH: TSH: 1.75 mIU/L (ref 0.40–4.50)

## 2020-07-05 LAB — LIPID PANEL
Cholesterol: 204 mg/dL — ABNORMAL HIGH (ref <200)
HDL: 63 mg/dL (ref >=50)
LDL Cholesterol (Calc): 124 mg/dL (calc) — ABNORMAL HIGH
Non-HDL Cholesterol (Calc): 141 mg/dL (calc) — ABNORMAL HIGH (ref <130)
Total CHOL/HDL Ratio: 3.2 (calc) (ref <5.0)
Triglycerides: 80 mg/dL (ref <150)

## 2020-07-05 LAB — SEDIMENTATION RATE: Sed Rate: 11 mm/h (ref 0–30)

## 2020-07-05 LAB — HEMOGLOBIN A1C
Hgb A1c MFr Bld: 5.8 % of total Hgb — ABNORMAL HIGH (ref <5.7)
Mean Plasma Glucose: 120 (calc)
eAG (mmol/L): 6.6 (calc)

## 2020-07-05 LAB — IRON, TOTAL/TOTAL IRON BINDING CAP
%SAT: 19 % (calc) (ref 16–45)
Iron: 76 ug/dL (ref 45–160)
TIBC: 390 mcg/dL (calc) (ref 250–450)

## 2020-07-05 LAB — VITAMIN D 25 HYDROXY (VIT D DEFICIENCY, FRACTURES): Vit D, 25-Hydroxy: 87 ng/mL (ref 30–100)

## 2020-07-05 LAB — VITAMIN B12: Vitamin B-12: >2000 pg/mL — ABNORMAL HIGH (ref 200–1100)

## 2020-07-05 LAB — FERRITIN: Ferritin: 60 ng/mL (ref 16–288)

## 2020-07-05 LAB — MAGNESIUM: Magnesium: 2 mg/dL (ref 1.5–2.5)

## 2020-07-09 NOTE — Telephone Encounter (Signed)
Patient called to request referral to ENT for fluid on inner ear. Please advise.

## 2020-07-10 NOTE — Telephone Encounter (Signed)
-----   Message from Vicie Mutters, [place] sent at 07/10/2020  6:20 AM EDT ----- Regarding: RE: phone I have sent in the ENT referral and Katrina is working on it. If she gets any fever, chills, or symptoms change let us know.  Estill Bamberg ----- Message ----- From: Elenor Quinones, CMA Sent: 07/09/2020   4:18 PM EDT To: Vicie Mutters, PA-C Subject: phone                                          Patient states that she needs a ENT referral  [facility]

## 2020-07-12 ENCOUNTER — Ambulatory Visit: Payer: BC Managed Care – PPO | Admitting: Orthopedic Surgery

## 2020-07-12 ENCOUNTER — Encounter: Payer: Self-pay | Admitting: Orthopedic Surgery

## 2020-07-12 ENCOUNTER — Ambulatory Visit: Payer: Self-pay

## 2020-07-12 DIAGNOSIS — M79602 Pain in left arm: Secondary | ICD-10-CM

## 2020-07-12 DIAGNOSIS — M542 Cervicalgia: Secondary | ICD-10-CM | POA: Diagnosis not present

## 2020-07-12 NOTE — Progress Notes (Signed)
Office Visit Note   Patient: Jenna Miller           Date of Birth: 1956-06-10           MRN: [id number] Visit Date: 07/12/2020 Requested by: Vicie Mutters, PA-C [address] [place],  [place] [address] PCP: Unk Pinto, MD  Subjective: Chief Complaint  Patient presents with  . Left Arm - Pain    HPI: Jenna Miller is a 64 year old patient with several month history of left shoulder pain.  She is a patient with a history of breast cancer.  She reports left arm pain from the shoulder to the wrist including a component of scapular pain.  She describes mildly decreased range of motion.  She does report some paresthesias in the left hand with most of her pain coming from just above the elbow to just below the wrist.  Hard for her to lift things at times.  She was working out about 6 months ago when she felt a pop in the scapular area.  She was doing some weight training at the time.  She describes forearm pain with lifting and a pulling sensation in her arm.  Takes Tylenol for her symptoms.              ROS: All systems reviewed are negative as they relate to the chief complaint within the history of present illness.  Patient denies  fevers or chills.   Assessment & Plan: Visit Diagnoses:  1. Left arm pain     Plan: Impression is left arm pain with normal forearm radiographs and normal shoulder radiographs.  I think this looks like radiculopathy.  Most of her symptoms are in that forearm region.  I would favor MRI scanning of the cervical spine at this time particularly based on her history of having breast cancer in the past.  No red flag symptoms today.  She is fairly concerned about going into the MRI scanner.  I told her we could try Valium as well as an open scanner but she wants to think about it for a little while.  Going to see her back as needed.  Follow-Up Instructions: Return if symptoms worsen or fail to improve.   Orders:  Orders Placed This Encounter    Procedures  . XR Cervical Spine 2 or 3 views  . XR Shoulder Left  . XR Forearm Left   No orders of the defined types were placed in this encounter.     Procedures: No procedures performed   Clinical Data: No additional findings.  Objective: Vital Signs: LMP 06/10/2010   Physical Exam:   Constitutional: Patient appears well-developed HEENT:  Head: Normocephalic Eyes:EOM are normal Neck: Normal range of motion Cardiovascular: Normal rate Pulmonary/chest: Effort normal Neurologic: Patient is alert Skin: Skin is warm Psychiatric: Patient has normal mood and affect    Ortho Exam: Ortho exam demonstrates good cervical spine range of motion.  Patient has 5 out of 5 grip EPL FPL interosseous wrist flexion extension bicep triceps and deltoid strength.  Radial pulse intact bilaterally.  No masses lymphadenopathy or skin changes noted in that shoulder girdle region.  She does lack about 10 degrees of full forward flexion on the left compared to the right as well as lacks about 10 degrees of symmetric external rotation on the left compared to the right.  On the right-hand side she is about 60 on the left she is about 45.  Rotator cuff strength is excellent infraspinatus supraspinatus and  subscap muscle testing and there is no AC joint tenderness to direct palpation.  No definite paresthesias C5-T1.  Radial pulse intact bilaterally.  T  Specialty Comments:  No specialty comments available.  Imaging: XR Cervical Spine 2 or 3 views  Result Date: 07/12/2020 AP lateral cervical spine reviewed.  Mild loss of lordosis is present.  Joint spaces relatively well-maintained throughout the cervical spine.  Facet arthritis is present on the AP view within the mid cervical region.  No spondylolisthesis or compression fractures.  No lesions affecting structural bony architecture.  XR Forearm Left  Result Date: 07/12/2020 AP lateral left forearm to include the elbow and wrist reviewed.  No  fracture or dislocation.  Normal alignment.  Normal forearm radiographs.  XR Shoulder Left  Result Date: 07/12/2020 AP axillary outlet radiographs left shoulder reviewed.  Shoulder is located.  Moderate degenerative changes noted at the [facility] joint.  No glenohumeral joint degenerative changes.  Visualized lung fields clear.  No soft tissue calcifications present.  Acromiohumeral distance maintained.    PMFS History: Patient Active Problem List   Diagnosis Date Noted  . Constipation 02/09/2019  . CKD Stage 2 (GFR 69 ml/min) 09/22/2014  . Medication management 09/22/2014  . Prediabetes 09/22/2014  . Morbid Obesity (BMI 32.54) 09/22/2014  . Breast cancer, Left 09/22/2014  . GERD   . Vitamin D deficiency   . Fatty liver   . Hypertension   . Hyperlipidemia    Past Medical History:  Diagnosis Date  . Acid reflux   . Arthritis   . Cancer ([place])    Left Breast  . Complication of anesthesia    woke  up X 1  . Fatty liver   . High cholesterol   . Hypertension   . Vitamin D deficiency     Family History  Problem Relation Age of Onset  . Hypertension Mother   . Hyperlipidemia Mother   . Cancer Cousin        lung  . Hyperlipidemia Brother   . Hypertension Brother     Past Surgical History:  Procedure Laterality Date  . BREAST SURGERY  1995   mastectomy left  . CHOLECYSTECTOMY  11/19/2012   Procedure: LAPAROSCOPIC CHOLECYSTECTOMY WITH INTRAOPERATIVE CHOLANGIOGRAM;  Surgeon: Zenovia Jarred, MD;  Location: [facility];  Service: General;  Laterality: N/A;  . [place] ARTHROSCOPY  1982  . MASTECTOMY Left 1995  . TRAM  1999-2000  . WISDOM TOOTH EXTRACTION     Social History   Occupational History  . Not on file  Tobacco Use  . Smoking status: Never Smoker  . Smokeless tobacco: Never Used  Substance and Sexual Activity  . Alcohol use: Yes    Comment: rare  . Drug use: No  . Sexual activity: Not on file

## 2020-07-13 ENCOUNTER — Other Ambulatory Visit: Payer: Self-pay | Admitting: Physician Assistant

## 2020-07-23 DIAGNOSIS — H11001 Unspecified pterygium of right eye: Secondary | ICD-10-CM | POA: Diagnosis not present

## 2020-07-23 DIAGNOSIS — R7309 Other abnormal glucose: Secondary | ICD-10-CM | POA: Diagnosis not present

## 2020-07-23 DIAGNOSIS — H524 Presbyopia: Secondary | ICD-10-CM | POA: Diagnosis not present

## 2020-07-23 DIAGNOSIS — H40053 Ocular hypertension, bilateral: Secondary | ICD-10-CM | POA: Diagnosis not present

## 2020-07-23 LAB — HM DIABETES EYE EXAM

## 2020-07-24 ENCOUNTER — Encounter: Payer: Self-pay | Admitting: *Deleted

## 2020-07-24 NOTE — Telephone Encounter (Signed)
Office visit scheduled.

## 2020-07-27 DIAGNOSIS — H6983 Other specified disorders of Eustachian tube, bilateral: Secondary | ICD-10-CM | POA: Diagnosis not present

## 2020-07-27 DIAGNOSIS — H903 Sensorineural hearing loss, bilateral: Secondary | ICD-10-CM | POA: Diagnosis not present

## 2020-08-03 ENCOUNTER — Ambulatory Visit: Payer: BC Managed Care – PPO | Admitting: Physician Assistant

## 2020-08-06 ENCOUNTER — Other Ambulatory Visit: Payer: Self-pay

## 2020-08-07 ENCOUNTER — Encounter: Payer: Self-pay | Admitting: Family Medicine

## 2020-08-07 ENCOUNTER — Ambulatory Visit: Payer: BC Managed Care – PPO | Admitting: Family Medicine

## 2020-08-07 VITALS — BP 122/68 | HR 77 | Temp 98.5°F | Ht 61.0 in | Wt 175.6 lb

## 2020-08-07 DIAGNOSIS — H6981 Other specified disorders of Eustachian tube, right ear: Secondary | ICD-10-CM

## 2020-08-07 DIAGNOSIS — R42 Dizziness and giddiness: Secondary | ICD-10-CM | POA: Diagnosis not present

## 2020-08-07 DIAGNOSIS — R7303 Prediabetes: Secondary | ICD-10-CM | POA: Diagnosis not present

## 2020-08-07 DIAGNOSIS — H6991 Unspecified Eustachian tube disorder, right ear: Secondary | ICD-10-CM | POA: Insufficient documentation

## 2020-08-07 NOTE — Patient Instructions (Signed)
Eustachian Tube Dysfunction  Eustachian tube dysfunction refers to a condition in which a blockage develops in the narrow passage that connects the middle ear to the back of the nose (eustachian tube). The eustachian tube regulates air pressure in the middle ear by letting air move between the ear and nose. It also helps to drain fluid from the middle ear space. Eustachian tube dysfunction can affect one or both ears. When the eustachian tube does not function properly, air pressure, fluid, or both can build up in the middle ear. What are the causes? This condition occurs when the eustachian tube becomes blocked or cannot open normally. Common causes of this condition include:  Ear infections.  Colds and other infections that affect the nose, mouth, and throat (upper respiratory tract).  Allergies.  Irritation from cigarette smoke.  Irritation from stomach acid coming up into the esophagus (gastroesophageal reflux). The esophagus is the tube that carries food from the mouth to the stomach.  Sudden changes in air pressure, such as from descending in an airplane or scuba diving.  Abnormal growths in the nose or throat, such as: ? Growths that line the nose (nasal polyps). ? Abnormal growth of cells (tumors). ? Enlarged tissue at the back of the throat (adenoids). What increases the risk? You are more likely to develop this condition if:  You smoke.  You are overweight.  You are a child who has: ? Certain birth defects of the mouth, such as cleft palate. ? Large tonsils or adenoids. What are the signs or symptoms? Common symptoms of this condition include:  A feeling of fullness in the ear.  Ear pain.  Clicking or popping noises in the ear.  Ringing in the ear.  Hearing loss.  Loss of balance.  Dizziness. Symptoms may get worse when the air pressure around you changes, such as when you travel to an area of high elevation, fly on an airplane, or go scuba diving. How is  this diagnosed? This condition may be diagnosed based on:  Your symptoms.  A physical exam of your ears, nose, and throat.  Tests, such as those that measure: ? The movement of your eardrum (tympanogram). ? Your hearing (audiometry). How is this treated? Treatment depends on the cause and severity of your condition.  In mild cases, you may relieve your symptoms by moving air into your ears. This is called "popping the ears."  In more severe cases, or if you have symptoms of fluid in your ears, treatment may include: ? Medicines to relieve congestion (decongestants). ? Medicines that treat allergies (antihistamines). ? Nasal sprays or ear drops that contain medicines that reduce swelling (steroids). ? A procedure to drain the fluid in your eardrum (myringotomy). In this procedure, a small tube is placed in the eardrum to:  Drain the fluid.  Restore the air in the middle ear space. ? A procedure to insert a balloon device through the nose to inflate the opening of the eustachian tube (balloon dilation). Follow these instructions at home: Lifestyle  Do not do any of the following until your health care provider approves: ? Travel to high altitudes. ? Fly in airplanes. ? Work in a pressurized cabin or room. ? Scuba dive.  Do not use any products that contain nicotine or tobacco, such as cigarettes and e-cigarettes. If you need help quitting, ask your health care provider.  Keep your ears dry. Wear fitted earplugs during showering and bathing. Dry your ears completely after. General instructions  Take over-the-counter   and prescription medicines only as told by your health care provider.  Use techniques to help pop your ears as recommended by your health care provider. These may include: ? Chewing gum. ? Yawning. ? Frequent, forceful swallowing. ? Closing your mouth, holding your nose closed, and gently blowing as if you are trying to blow air out of your nose.  Keep all  follow-up visits as told by your health care provider. This is important. Contact a health care provider if:  Your symptoms do not go away after treatment.  Your symptoms come back after treatment.  You are unable to pop your ears.  You have: ? A fever. ? Pain in your ear. ? Pain in your head or neck. ? Fluid draining from your ear.  Your hearing suddenly changes.  You become very dizzy.  You lose your balance. Summary  Eustachian tube dysfunction refers to a condition in which a blockage develops in the eustachian tube.  It can be caused by ear infections, allergies, inhaled irritants, or abnormal growths in the nose or throat.  Symptoms include ear pain, hearing loss, or ringing in the ears.  Mild cases are treated with maneuvers to unblock the ears, such as yawning or ear popping.  Severe cases are treated with medicines. Surgery may also be done (rare). This information is not intended to replace advice given to you by your health care provider. Make sure you discuss any questions you have with your health care provider. Document Revised: 04/06/2018 Document Reviewed: 04/06/2018 Elsevier Patient Education  Jenna Miller.  Preventing Type 2 Diabetes Mellitus Type 2 diabetes (type 2 diabetes mellitus) is a long-term (chronic) disease that affects blood sugar (glucose) levels. Normally, a hormone called insulin allows glucose to enter cells in the body. The cells use glucose for energy. In type 2 diabetes, one or both of these problems may be present:  The body does not make enough insulin.  The body does not respond properly to insulin that it makes (insulin resistance). Insulin resistance or lack of insulin causes excess glucose to build up in the blood instead of going into cells. As a result, high blood glucose (hyperglycemia) develops, which can cause many complications. Being overweight or obese and having an inactive (sedentary) lifestyle can increase your risk  for diabetes. Type 2 diabetes can be delayed or prevented by making certain nutrition and lifestyle changes. What nutrition changes can be made?   Eat healthy meals and snacks regularly. Keep a healthy snack with you for when you get hungry between meals, such as fruit or a handful of nuts.  Eat lean meats and proteins that are low in saturated fats, such as chicken, fish, egg whites, and beans. Avoid processed meats.  Eat plenty of fruits and vegetables and plenty of grains that have not been processed (whole grains). It is recommended that you eat: ? 1?2 cups of fruit every day. ? 2?3 cups of vegetables every day. ? 6?8 oz of whole grains every day, such as oats, whole wheat, bulgur, brown rice, quinoa, and millet.  Eat low-fat dairy products, such as milk, yogurt, and cheese.  Eat foods that contain healthy fats, such as nuts, avocado, olive oil, and canola oil.  Drink water throughout the day. Avoid drinks that contain added sugar, such as soda or sweet tea.  Follow instructions from your health care provider about specific eating or drinking restrictions.  Control how much food you eat at a time (portion size). ? Check food labels  to find out the serving sizes of foods. ? Use a kitchen scale to weigh amounts of foods.  Saute or steam food instead of frying it. Cook with water or broth instead of oils or butter.  Limit your intake of: ? Salt (sodium). Have no more than 1 tsp (2,400 mg) of sodium a day. If you have heart disease or high blood pressure, have less than ? tsp (1,500 mg) of sodium a day. ? Saturated fat. This is fat that is solid at room temperature, such as butter or fat on meat. What lifestyle changes can be made? Activity   Do moderate-intensity physical activity for at least 30 minutes on at least 5 days of the week, or as much as told by your health care provider.  Ask your health care provider what activities are safe for you. A mix of physical activities  may be best, such as walking, swimming, cycling, and strength training.  Try to add physical activity into your day. For example: ? Park in spots that are farther away than usual, so that you walk more. For example, park in a far corner of the parking lot when you go to the office or the grocery store. ? Take a walk during your lunch break. ? Use stairs instead of elevators or escalators. Weight Loss  Lose weight as directed. Your health care provider can determine how much weight loss is best for you and can help you lose weight safely.  If you are overweight or obese, you may be instructed to lose at least 5?7 % of your body weight. Alcohol and Tobacco   Limit alcohol intake to no more than 1 drink a day for nonpregnant women and 2 drinks a day for men. One drink equals 12 oz of beer, 5 oz of wine, or 1 oz of hard liquor.  Do not use any tobacco products, such as cigarettes, chewing tobacco, and e-cigarettes. If you need help quitting, ask your health care provider. Work With [facility] Provider  Have your blood glucose tested regularly, as told by your health care provider.  Discuss your risk factors and how you can reduce your risk for diabetes.  Get screening tests as told by your health care provider. You may have screening tests regularly, especially if you have certain risk factors for type 2 diabetes.  Make an appointment with a diet and nutrition specialist (registered dietitian). A registered dietitian can help you make a healthy eating plan and can help you understand portion sizes and food labels. Why are these changes important?  It is possible to prevent or delay type 2 diabetes and related health problems by making lifestyle and nutrition changes.  It can be difficult to recognize signs of type 2 diabetes. The best way to avoid possible damage to your body is to take actions to prevent the disease before you develop symptoms. What can happen if changes are not  made?  Your blood glucose levels may keep increasing. Having high blood glucose for a long time is dangerous. Too much glucose in your blood can damage your blood vessels, heart, kidneys, nerves, and eyes.  You may develop prediabetes or type 2 diabetes. Type 2 diabetes can lead to many chronic health problems and complications, such as: ? Heart disease. ? Stroke. ? Blindness. ? Kidney disease. ? Depression. ? Poor circulation in the feet and legs, which could lead to surgical removal (amputation) in severe cases. Where to find support  Ask your health care  provider to recommend a registered dietitian, diabetes educator, or weight loss program.  Look for local or online weight loss groups.  Join a gym, fitness club, or outdoor activity group, such as a walking club. Where to find more information To learn more about diabetes and diabetes prevention, visit:  American Diabetes Association (ADA): www.diabetes.[employer] of Diabetes and Digestive and Kidney Diseases: FindSpin.nl To learn more about healthy eating, visit:  The U.S. Department of Agriculture Scientist, research (physical sciences)), Choose My Plate: http://wiley-williams.com/  Office of Disease Prevention and Health Promotion (ODPHP), Dietary Guidelines: SurferLive.at Summary  You can reduce your risk for type 2 diabetes by increasing your physical activity, eating healthy foods, and losing weight as directed.  Talk with your health care provider about your risk for type 2 diabetes. Ask about any blood tests or screening tests that you need to have. This information is not intended to replace advice given to you by your health care provider. Make sure you discuss any questions you have with your health care provider. Document Revised: 04/08/2019 Document Reviewed: 02/05/2016 Elsevier Patient Education  Jenna Miller.

## 2020-08-07 NOTE — Progress Notes (Signed)
New Patient Office Visit  Subjective:  Patient ID: Jenna Miller, female    DOB: November 20, 1956  Age: 64 y.o. MRN: [id number]  CC:  Chief Complaint  Patient presents with  . Establish Care    New patient, patient states that both ears feel full causes dizzy spells and nausea have taken predinisone seen.     HPI Jenna Miller presents for establishment of care and follow-up of her ongoing issues with "dizziness".  Jenna Miller describes dizziness as a lightheaded feeling when Jenna Miller stands up.  Jenna Miller denies a spinning sensation.  Jenna Miller takes HCTZ approximately once a week for swelling in her lower extremities.  Recently diagnosed with prediabetes with a hemoglobin A1c of 5.8.  Jenna Miller denies headaches.  Jenna Miller is very stressed on her job.  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  Jenna Miller lives at home with her significant other and her 2 beagles.  Jenna Miller does not smoke or use illicit drugs.  Jenna Miller rarely drinks alcohol.  Jenna Miller is seeing ENT for eustachian tube dysfunction that is currently being treated with Flonase.  Jenna Miller is status post prednisone taper and steroid injection.    Past Medical History:  Diagnosis Date  . Acid reflux   . Arthritis   . Cancer ([place])    Left Breast  . Complication of anesthesia    woke  up X 1  . Fatty liver   . High cholesterol   . Hypertension   . Vitamin D deficiency     Past Surgical History:  Procedure Laterality Date  . BREAST SURGERY  1995   mastectomy left  . CHOLECYSTECTOMY  11/19/2012   Procedure: LAPAROSCOPIC CHOLECYSTECTOMY WITH INTRAOPERATIVE CHOLANGIOGRAM;  Surgeon: Zenovia Jarred, MD;  Location: [facility];  Service: General;  Laterality: N/A;  . [place] ARTHROSCOPY  1982  . MASTECTOMY Left 1995  . TRAM  1999-2000  . WISDOM TOOTH EXTRACTION      Family History  Problem Relation Age of Onset  . Hypertension Mother   . Hyperlipidemia Mother   . Cancer Cousin        lung  .  Hyperlipidemia Brother   . Hypertension Brother     Social History   Socioeconomic History  . Marital status: Divorced    Spouse name: Not on file  . Number of children: Not on file  . Years of education: Not on file  . Highest education level: Not on file  Occupational History  . Not on file  Tobacco Use  . Smoking status: Never Smoker  . Smokeless tobacco: Never Used  Vaping Use  . Vaping Use: Never used  Substance and Sexual Activity  . Alcohol use: Yes    Alcohol/week: 1.0 standard drink    Types: 1 Cans of beer per week    Comment: rare  . Drug use: No  . Sexual activity: Not on file  Other Topics Concern  . Not on file  Social History Narrative  . Not on file   Social Determinants of Health   Financial Resource Strain:   . Difficulty of Paying Living Expenses:   Food Insecurity:   . Worried About Charity fundraiser in the Last Year:   . Arboriculturist in the Last Year:   Transportation Needs:   . Film/video editor (Medical):   Marland Kitchen Lack of Transportation (Non-Medical):   Physical Activity:   .  Days of Exercise per Week:   . Minutes of Exercise per Session:   Stress:   . Feeling of Stress :   Social Connections:   . Frequency of Communication with Friends and Family:   . Frequency of Social Gatherings with Friends and Family:   . Attends Religious Services:   . Active Member of Clubs or Organizations:   . Attends Archivist Meetings:   Marland Kitchen Marital Status:   Intimate Partner Violence:   . Fear of Current or Ex-Partner:   . Emotionally Abused:   Marland Kitchen Physically Abused:   . Sexually Abused:     ROS Review of Systems  Constitutional: Negative for chills, diaphoresis, fatigue, fever and unexpected weight change.  HENT: Positive for hearing loss. Negative for ear discharge and ear pain.   Eyes: Negative for photophobia and visual disturbance.  Respiratory: Negative.   Cardiovascular: Negative.   Gastrointestinal: Negative.   Endocrine:  Negative for polyphagia and polyuria.  Musculoskeletal: Negative for gait problem and joint swelling.  Neurological: Positive for light-headedness. Negative for dizziness, weakness and headaches.  Hematological: Does not bruise/bleed easily.  Psychiatric/Behavioral: Negative.     Objective:   Today's Vitals: BP 122/68   Pulse 77   Temp 98.5 F (36.9 C) (Tympanic)   Ht 5\' 1"  (1.549 m)   Wt 175 lb 9.6 oz (79.7 kg)   LMP 06/10/2010   SpO2 97%   BMI 33.18 kg/m   Physical Exam Vitals and nursing note reviewed.  Constitutional:      General: Jenna Miller is not in acute distress.    Appearance: Normal appearance. Jenna Miller is not ill-appearing or toxic-appearing.  HENT:     Head: Normocephalic and atraumatic.     Right Ear: Tympanic membrane is scarred and retracted.     Left Ear: Tympanic membrane, ear canal and external ear normal.     Nose: Nose normal.     Mouth/Throat:     Mouth: Mucous membranes are moist.     Pharynx: Oropharynx is clear. No oropharyngeal exudate or posterior oropharyngeal erythema.  Eyes:     General:        Right eye: No discharge.        Left eye: No discharge.     Extraocular Movements: Extraocular movements intact.     Conjunctiva/sclera: Conjunctivae normal.     Pupils: Pupils are equal, round, and reactive to light.  Neck:     Vascular: No carotid bruit.  Cardiovascular:     Rate and Rhythm: Normal rate and regular rhythm.  Pulmonary:     Effort: Pulmonary effort is normal.     Breath sounds: Normal breath sounds.  Abdominal:     General: Bowel sounds are normal.  Musculoskeletal:     Cervical back: Normal range of motion and neck supple. No rigidity or tenderness.     Right lower leg: No edema.     Left lower leg: No edema.  Lymphadenopathy:     Cervical: No cervical adenopathy.  Skin:    General: Skin is warm and dry.  Neurological:     General: No focal deficit present.     Mental Status: Jenna Miller is alert and oriented to person, place, and time.    Psychiatric:        Mood and Affect: Mood normal.        Behavior: Behavior normal.     Assessment & Plan:   Problem List Items Addressed This Visit      Nervous and  Auditory   Dysfunction of right eustachian tube - Primary     Other   Pre-diabetes   Lightheaded      Outpatient Encounter Medications as of 08/07/2020  Medication Sig  . Cholecalciferol (VITAMIN D3) 2000 UNITS capsule Take 5,000 mg by mouth.  . ezetimibe (ZETIA) 10 MG tablet TAKE 1 TABLET BY MOUTH EVERY DAY FOR CHOLESTEROL  . famotidine (PEPCID) 40 MG tablet Take 1 tablet 2 x /day for Acid Indigestion & Reflux  . fluticasone (FLONASE) 50 MCG/ACT nasal spray SPRAY 2 SPRAYS INTO EACH NOSTRIL EVERY DAY  . hydrochlorothiazide (HYDRODIURIL) 25 MG tablet TAKE 1 TABLET BY MOUTH EVERY DAY FOR BLOOD PRESSURE AND FLUID  . nystatin cream (MYCOSTATIN) Apply 1 application topically 2 (two) times daily.  . predniSONE (DELTASONE) 20 MG tablet 2 tablets daily for 3 days, 1 tablet daily for 4 days.  . [DISCONTINUED] ALPRAZolam (XANAX) 0.5 MG tablet Take 1 tablet (0.5 mg total) by mouth at bedtime as needed for anxiety or sleep. (Patient not taking: Reported on 08/07/2020)  . [DISCONTINUED] cyclobenzaprine (FLEXERIL) 10 MG tablet Take 1 tablet (10 mg total) by mouth 3 (three) times daily as needed for muscle spasms. (Patient not taking: Reported on 08/07/2020)  . [DISCONTINUED] triamcinolone cream (KENALOG) 0.5 % Apply 1 application topically 2 (two) times daily. (Patient not taking: Reported on 08/07/2020)   No facility-administered encounter medications on file as of 08/07/2020.    Follow-up: Return in about 3 months (around 11/07/2020).   Advised her to go ahead and stop taking HCTZ because this could be causing her symptoms of lightheadedness.  Jenna Miller did not tilt with orthostatic checks.  Jenna Miller will continue Flonase for ETD.  Jenna Miller will continue weight loss efforts for treatment of her prediabetes.  Given information on ADD and preventing  diabetes.  Libby Maw, MD

## 2020-09-27 DIAGNOSIS — H903 Sensorineural hearing loss, bilateral: Secondary | ICD-10-CM | POA: Diagnosis not present

## 2020-09-27 DIAGNOSIS — G43809 Other migraine, not intractable, without status migrainosus: Secondary | ICD-10-CM | POA: Diagnosis not present

## 2020-09-27 DIAGNOSIS — J300 Vasomotor rhinitis: Secondary | ICD-10-CM | POA: Diagnosis not present

## 2020-10-09 DIAGNOSIS — Z01419 Encounter for gynecological examination (general) (routine) without abnormal findings: Secondary | ICD-10-CM | POA: Diagnosis not present

## 2020-10-09 DIAGNOSIS — R339 Retention of urine, unspecified: Secondary | ICD-10-CM | POA: Diagnosis not present

## 2020-10-09 DIAGNOSIS — Z6832 Body mass index (BMI) 32.0-32.9, adult: Secondary | ICD-10-CM | POA: Diagnosis not present

## 2020-10-15 NOTE — Progress Notes (Signed)
Patient ID: CELE MOTE, female   DOB: 1956-09-23, 64 y.o.   MRN: [id number]  COMPLETE PHYSICAL  Assessment and Plan:    Lillyanna was seen today for annual exam.  Diagnoses and all orders for this visit:  Essential hypertension Continue current medications: Monitor blood pressure at home; call if consistently over 130/80 Continue DASH diet.   Reminder to go to the ER if any CP, SOB, nausea, dizziness, severe HA, changes vision/speech, left arm numbness and tingling and jaw pain. -     CBC with Differential/Platelet -     COMPLETE METABOLIC PANEL WITH GFR -     Magnesium -     EKG 12-Lead  Hyperlipidemia Lifestyle diet controlled Discussed dietary and exercise modifications Low fat diet -     Lipid panel  Abnormal glucose Discussed dietary and exercise modifications -     Hemoglobin A1c  Vitamin D deficiency -     VITAMIN D 25 Hydroxy (Vit-D Deficiency, Fractures)  Morbid Obesity (BMI 32.54) .plankmdee  Gastroesophageal reflux disease, unspecified whether esophagitis present Doing well at this time Continue: famotidine 40mg  daily Diet discussed Monitor for triggers Avoid food with high acid content Avoid excessive cafeine Increase water intake  Fatty liver Weight loss advised, avoid alcohol/tylenol, will monitor LFTs  CKD Stage 2 (GFR 69 ml/min) Increase fluids  Avoid NSAIDS Blood pressure control Monitor sugars  Will continue to monitor  Malignant neoplasm of left female breast, unspecified estrogen receptor status, unspecified site of breast ([place]) Monitored  Irritable bowel syndrome with constipation If not on benefiber then add it, decrease stress,  if any worsening symptoms, blood in stool, AB pain, etc call office  Medication management Continued  Acute pain of left shoulder Following with orthopedics Reports needing MRI  Vestibular Migraines Follows with ENT for this Recommended topamax daily Patient not taking at this time Symptoms  returning Discussed follow up with ENT  Screening for thyroid disorder -     TSH  Screening for blood or protein in urine -     Urinalysis w microscopic + reflex cultur -     Microalbumin / creatinine urine ratio  Encounter for vitamin deficiency screening -     Vitamin B12 -     Iron,Total/Total Iron Binding Cap  Other orders -     Flu vaccine HIGH DOSE PF -     Urine Culture -     REFLEXIVE URINE CULTURE  Vitamin D deficiency -     VITAMIN D 25 Hydroxy (Vit-D Deficiency, Fractures)  Malignant neoplasm of left female breast, unspecified estrogen receptor status, unspecified site of breast ([place]) monitored  Received influenza vaccination today.  Continue diet and meds as discussed. Further disposition pending results of labs.  HPI 64 y.o. female  presents for CPE and follow up on HTN, HLD, GERD, abnormal glucose / prediabetes, IBS-C, CKDII, weight and vitamin D.   She recently had evaluation by Orthopedics for left shoulder pain.  Reports some cervical nerve impingement.  She is having excruciating pain.  MRI is next step as reportd by patint.   She had recent evaluation of dizziness on 08/07/20.  Eructation tube improved after prednisone injection.  Reports she is having inner ear migraines.   She was given toporimate to help reduce migraines.  She did not take this medicaiton.  She reports she has ear fullness and has ringing.  She has been using Dramamine on two ocassions and this was effective.   Recommendations to stop HCTZ.  Follow  up in 3 months with Dr. Ethelene Hal, [place].   Her blood pressure has been controlled at home, today their BP is  .   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   She denies chest pain, shortness of breath, dizziness.  BMI is There is no height or weight on file to calculate BMI., she is working on diet and exercise. Wt Readings from Last 3  Encounters:  08/07/20 175 lb 9.6 oz (79.7 kg)  07/04/20 179 lb (81.2 kg)  02/29/20 186 lb (84.4 kg)   She has hypertensive CKD stage 2, she is on HCTZ. Lab Results  Component Value Date   GGYIRSWN 46 (L) 07/04/2020   She has IBS-C is on linzess, states the 75 was working, then she moved to the 145 and now she is still having round pellets.    She is on cholesterol medication, zetia and denies myalgias. Her cholesterol is at goal. The cholesterol last visit was:   Lab Results  Component Value Date   CHOL 204 (H) 07/04/2020   HDL 63 07/04/2020   LDLCALC 124 (H) 07/04/2020   TRIG 80 07/04/2020   CHOLHDL 3.2 07/04/2020    She has been working on diet and exercise for prediabetes, and denies foot ulcerations, hyperglycemia, hypoglycemia , increased appetite, nausea, paresthesia of the feet, polydipsia, polyuria, visual disturbances, vomiting and weight loss. Last A1C in the office was:  Lab Results  Component Value Date   HGBA1C 5.8 (H) 07/04/2020   Patient is on Vitamin D supplement, 8000 a day.   Lab Results  Component Value Date   VD25OH 87 07/04/2020    Current Medications:  Current Outpatient Medications on File Prior to Visit  Medication Sig Dispense Refill  . Cholecalciferol (VITAMIN D3) 2000 UNITS capsule Take 5,000 mg by mouth.    . ezetimibe (ZETIA) 10 MG tablet TAKE 1 TABLET BY MOUTH EVERY DAY FOR CHOLESTEROL 90 tablet 3  . famotidine (PEPCID) 40 MG tablet Take 1 tablet 2 x /day for Acid Indigestion & Reflux 180 tablet 3  . fluticasone (FLONASE) 50 MCG/ACT nasal spray SPRAY 2 SPRAYS INTO EACH NOSTRIL EVERY DAY 48 mL 1  . hydrochlorothiazide (HYDRODIURIL) 25 MG tablet TAKE 1 TABLET BY MOUTH EVERY DAY FOR BLOOD PRESSURE AND FLUID 90 tablet 3  . nystatin cream (MYCOSTATIN) Apply 1 application topically 2 (two) times daily. 30 g 1  . predniSONE (DELTASONE) 20 MG tablet 2 tablets daily for 3 days, 1 tablet daily for 4 days. 10 tablet 0   No current facility-administered  medications on file prior to visit.    Medical History:  Past Medical History:  Diagnosis Date  . Acid reflux   . Arthritis   . Cancer ([place])    Left Breast  . Complication of anesthesia    woke  up X 1  . Fatty liver   . High cholesterol   . Hypertension   . Vitamin D deficiency    Immunization History  Administered Date(s) Administered  . Influenza Inj Mdck Quad With Preservative 10/06/2018, 10/13/2019  . Influenza Split 08/29/2013, 10/16/2015  . Influenza, High Dose Seasonal PF 09/29/2017  . Influenza, Seasonal, Injecte, Preservative Fre 11/02/2014  . Influenza,inj,quad, With Preservative 09/24/2016  . PFIZER SARS-COV-2 Vaccination 03/10/2020, 03/31/2020  . PPD Test 03/14/2014, 07/11/2015  . Pneumococcal Polysaccharide-23 05/22/2003  . Td 05/28/2005  . Tdap 07/11/2015  .  Zoster 02/26/2016   Flu TODAY Tetanus: 2016 Pneumovax: 2004 Flu vaccine: 2019 Zostavax: 2017  MGM: 11/2018 Colonoscopy: 2017 CT AB 2014 CXR 2018 DEXA 2007 PAP 2016 declines another  Patient Care Team: Libby Maw, MD as PCP - General (Family Medicine)  Allergies Allergies  Allergen Reactions  . Lipitor [Atorvastatin] Anaphylaxis and Swelling    Other reaction(s): Other (See Comments) Unknown  . Effexor [Venlafaxine]     Other reaction(s): Other (See Comments)  . Paxil [Paroxetine Hcl]     Other reaction(s): Other (See Comments)  . Wellbutrin [Bupropion]     Other reaction(s): Other (See Comments) Hives Unknown Hives    SURGICAL HISTORY She  has a past surgical history that includes TRAM (1999-2000); Knee arthroscopy (1982); Wisdom tooth extraction; Cholecystectomy (11/19/2012); Breast surgery (1995); Hernia repair; and Mastectomy (Left, 1995). FAMILY HISTORY Her family history includes Cancer in her cousin; Hyperlipidemia in her brother and mother; Hypertension in her brother and mother. SOCIAL HISTORY She  reports that she has never smoked. She has never used  smokeless tobacco. She reports current alcohol use of about 1.0 standard drink of alcohol per week. She reports that she does not use drugs.  Review of Systems:  Review of Systems  Constitutional: Negative for chills, fever and malaise/fatigue.  HENT: Negative for congestion, ear pain and sore throat.   Respiratory: Negative for cough, shortness of breath and wheezing.   Cardiovascular: Negative for chest pain, palpitations and leg swelling.  Gastrointestinal: Negative for blood in stool, constipation, diarrhea, heartburn, melena and nausea.  Genitourinary: Negative.   Neurological: Negative for dizziness, sensory change, loss of consciousness and headaches.  Psychiatric/Behavioral: Negative for depression. The patient is not nervous/anxious and does not have insomnia.     Physical Exam: LMP 06/10/2010  Wt Readings from Last 3 Encounters:  08/07/20 175 lb 9.6 oz (79.7 kg)  07/04/20 179 lb (81.2 kg)  02/29/20 186 lb (84.4 kg)    General Appearance: Well nourished well developed, in no apparent distress. Eyes: PERRLA, EOMs, conjunctiva no swelling or erythema ENT/Mouth: Ear canals normal without obstruction, swelling, erythma, discharge.  TMs normal bilaterally.  Oropharynx moist, clear, without exudate, or postoropharyngeal swelling. Neck: Supple, thyroid normal,no cervical adenopathy  Respiratory: Respiratory effort normal, Breath sounds clear A&P without rhonchi, wheeze, or rale.  No retractions, no accessory usage. Cardio: RRR with no MRGs. Brisk peripheral pulses without edema.  Abdomen: Soft, + BS,  Non tender, no guarding, rebound, hernias, masses. Musculoskeletal: Full ROM, 5/5 strength, Normal gait Skin: Warm, dry without rashes, lesions, ecchymosis.  Neuro: Awake and oriented X 3, Cranial nerves intact. Normal muscle tone, no cerebellar symptoms. Psych: Normal affect, Insight and Judgment appropriate.   EKG: NSR    Bayard Males, DNP [facility] 10/16/2020  9:45 AM

## 2020-10-16 ENCOUNTER — Encounter: Payer: Self-pay | Admitting: Adult Health Nurse Practitioner

## 2020-10-16 ENCOUNTER — Ambulatory Visit: Payer: BC Managed Care – PPO | Admitting: Adult Health Nurse Practitioner

## 2020-10-16 ENCOUNTER — Other Ambulatory Visit: Payer: Self-pay

## 2020-10-16 VITALS — BP 120/82 | HR 70 | Temp 97.7°F | Ht 61.0 in | Wt 170.0 lb

## 2020-10-16 DIAGNOSIS — Z1321 Encounter for screening for nutritional disorder: Secondary | ICD-10-CM

## 2020-10-16 DIAGNOSIS — I10 Essential (primary) hypertension: Secondary | ICD-10-CM

## 2020-10-16 DIAGNOSIS — Z23 Encounter for immunization: Secondary | ICD-10-CM | POA: Diagnosis not present

## 2020-10-16 DIAGNOSIS — Z0000 Encounter for general adult medical examination without abnormal findings: Secondary | ICD-10-CM

## 2020-10-16 DIAGNOSIS — R7309 Other abnormal glucose: Secondary | ICD-10-CM

## 2020-10-16 DIAGNOSIS — Z131 Encounter for screening for diabetes mellitus: Secondary | ICD-10-CM | POA: Diagnosis not present

## 2020-10-16 DIAGNOSIS — K581 Irritable bowel syndrome with constipation: Secondary | ICD-10-CM

## 2020-10-16 DIAGNOSIS — Z79899 Other long term (current) drug therapy: Secondary | ICD-10-CM | POA: Diagnosis not present

## 2020-10-16 DIAGNOSIS — N182 Chronic kidney disease, stage 2 (mild): Secondary | ICD-10-CM

## 2020-10-16 DIAGNOSIS — R258 Other abnormal involuntary movements: Secondary | ICD-10-CM

## 2020-10-16 DIAGNOSIS — Z13220 Encounter for screening for lipoid disorders: Secondary | ICD-10-CM

## 2020-10-16 DIAGNOSIS — Z136 Encounter for screening for cardiovascular disorders: Secondary | ICD-10-CM

## 2020-10-16 DIAGNOSIS — G43809 Other migraine, not intractable, without status migrainosus: Secondary | ICD-10-CM

## 2020-10-16 DIAGNOSIS — Z130 Encounter for screening for diseases of the blood and blood-forming organs and certain disorders involving the immune mechanism: Secondary | ICD-10-CM | POA: Diagnosis not present

## 2020-10-16 DIAGNOSIS — E559 Vitamin D deficiency, unspecified: Secondary | ICD-10-CM | POA: Diagnosis not present

## 2020-10-16 DIAGNOSIS — E782 Mixed hyperlipidemia: Secondary | ICD-10-CM

## 2020-10-16 DIAGNOSIS — Z1389 Encounter for screening for other disorder: Secondary | ICD-10-CM | POA: Diagnosis not present

## 2020-10-16 DIAGNOSIS — K219 Gastro-esophageal reflux disease without esophagitis: Secondary | ICD-10-CM

## 2020-10-16 DIAGNOSIS — K760 Fatty (change of) liver, not elsewhere classified: Secondary | ICD-10-CM

## 2020-10-16 DIAGNOSIS — Z1329 Encounter for screening for other suspected endocrine disorder: Secondary | ICD-10-CM

## 2020-10-16 DIAGNOSIS — C50912 Malignant neoplasm of unspecified site of left female breast: Secondary | ICD-10-CM

## 2020-10-16 DIAGNOSIS — M25512 Pain in left shoulder: Secondary | ICD-10-CM

## 2020-10-16 NOTE — Patient Instructions (Addendum)
°  Consider taking the topamax (taporimate) as prescribed by ENT.  Contact to let them know  How the over the counter medication worked but that you are still having symptoms.  Either through follow up visit or message what your next step would be.   After your plan with ENT contact orthopedics to let them know the severity of your symptoms.  Since you are having nerve pain it is important to follow up.   We will send you a message about your lab results in 1-3 days.   Aim for 7+ servings of fruits and vegetables daily   65+ fluid ounces of water for healthy kidneys   Limit animal fats in diet for cholesterol and heart health - choose grass fed whenever available   Aim for low stress - take time to unwind and care for your mental health   Aim for 150 min of moderate intensity exercise weekly for heart health, and weights twice weekly for bone health   Aim for 7-9 hours of sleep daily          When it comes to diets, agreement about the perfect plan isnt easy to find, even among the experts. Experts at the [employer] developed an idea known as the Healthy Eating Plate. Just imagine a plate divided into logical, healthy portions.   The emphasis is on diet quality:   Load up on vegetables and fruits - one-half of your plate: Aim for color and variety, and remember that white potatoes are high starch and should be consumed in moderation.     Go for whole grains - one-quarter of your plate: Whole wheat, barley, wheat berries, quinoa, oats, brown rice, and foods made with them. If you want pasta, go with whole wheat pasta as serving sized noted on package.   Protein power - one-quarter of your plate: Fish, chicken, beans, and nuts are all healthy, versatile protein sources. Limit red meat.   The diet, however, does go beyond the plate, offering a few other suggestions.   Use healthy plant oils, such as olive, canola, soy, corn, sunflower and peanut. Check the  labels, and avoid partially hydrogenated oil, which have unhealthy trans fats.  This will help your cholesterol and heart health.   If youre thirsty, drink water.  Aim for 80 oz of water a day.  Coffee and tea are ok in moderation, but skip sugary drinks (juices, sodas, sweet tea) and limit milk and dairy products to one or two daily servings.   The type of carbohydrate in the diet is more important than the amount. Some sources of carbohydrates, such as vegetables, fruits, whole grains, and beans--are healthier than others.  If you are tech-savvy, check out [employer] for tips for replacing current unhealthy food choices, eating healthy on a budget, recipes and more.   Finally, stay active! Walking is an activity, involve family and friends.  It is FREE and maintains and or improves your health.

## 2020-10-18 LAB — CBC WITH DIFFERENTIAL/PLATELET
Absolute Monocytes: 435 cells/uL (ref 200–950)
Basophils Absolute: 28 cells/uL (ref 0–200)
Basophils Relative: 0.4 %
Eosinophils Absolute: 62 cells/uL (ref 15–500)
Eosinophils Relative: 0.9 %
HCT: 39.2 % (ref 35.0–45.0)
Hemoglobin: 13.1 g/dL (ref 11.7–15.5)
Lymphs Abs: 1352 cells/uL (ref 850–3900)
MCH: 30.3 pg (ref 27.0–33.0)
MCHC: 33.4 g/dL (ref 32.0–36.0)
MCV: 90.7 fL (ref 80.0–100.0)
MPV: 11 fL (ref 7.5–12.5)
Monocytes Relative: 6.3 %
Neutro Abs: 5023 cells/uL (ref 1500–7800)
Neutrophils Relative %: 72.8 %
Platelets: 244 10*3/uL (ref 140–400)
RBC: 4.32 10*6/uL (ref 3.80–5.10)
RDW: 13 % (ref 11.0–15.0)
Total Lymphocyte: 19.6 %
WBC: 6.9 10*3/uL (ref 3.8–10.8)

## 2020-10-18 LAB — COMPLETE METABOLIC PANEL WITH GFR
AG Ratio: 1.8 (calc) (ref 1.0–2.5)
ALT: 14 U/L (ref 6–29)
AST: 13 U/L (ref 10–35)
Albumin: 4.7 g/dL (ref 3.6–5.1)
Alkaline phosphatase (APISO): 49 U/L (ref 37–153)
BUN: 14 mg/dL (ref 7–25)
CO2: 29 mmol/L (ref 20–32)
Calcium: 10.2 mg/dL (ref 8.6–10.4)
Chloride: 103 mmol/L (ref 98–110)
Creat: 0.84 mg/dL (ref 0.50–0.99)
GFR, Est African American: 85 mL/min/{1.73_m2} (ref >=60)
GFR, Est Non African American: 73 mL/min/{1.73_m2} (ref >=60)
Globulin: 2.6 g/dL (calc) (ref 1.9–3.7)
Glucose, Bld: 100 mg/dL — ABNORMAL HIGH (ref 65–99)
Potassium: 4.4 mmol/L (ref 3.5–5.3)
Sodium: 140 mmol/L (ref 135–146)
Total Bilirubin: 0.5 mg/dL (ref 0.2–1.2)
Total Protein: 7.3 g/dL (ref 6.1–8.1)

## 2020-10-18 LAB — CULTURE INDICATED

## 2020-10-18 LAB — MICROALBUMIN / CREATININE URINE RATIO
Creatinine, Urine: 16 mg/dL — ABNORMAL LOW (ref 20–275)
Microalb, Ur: <0.2 mg/dL

## 2020-10-18 LAB — URINALYSIS W MICROSCOPIC + REFLEX CULTURE
Bacteria, UA: NONE SEEN /HPF
Bilirubin Urine: NEGATIVE
Glucose, UA: NEGATIVE
Hgb urine dipstick: NEGATIVE
Hyaline Cast: NONE SEEN /LPF
Ketones, ur: NEGATIVE
Nitrites, Initial: NEGATIVE
Protein, ur: NEGATIVE
RBC / HPF: NONE SEEN /HPF (ref 0–2)
Specific Gravity, Urine: 1.003 (ref 1.001–1.03)
Squamous Epithelial / HPF: NONE SEEN /HPF (ref <=5)
WBC, UA: NONE SEEN /HPF (ref 0–5)
pH: 6.5 (ref 5.0–8.0)

## 2020-10-18 LAB — LIPID PANEL
Cholesterol: 208 mg/dL — ABNORMAL HIGH (ref <200)
HDL: 67 mg/dL (ref >=50)
LDL Cholesterol (Calc): 123 mg/dL (calc) — ABNORMAL HIGH
Non-HDL Cholesterol (Calc): 141 mg/dL (calc) — ABNORMAL HIGH (ref <130)
Total CHOL/HDL Ratio: 3.1 (calc) (ref <5.0)
Triglycerides: 82 mg/dL (ref <150)

## 2020-10-18 LAB — URINE CULTURE
MICRO NUMBER:: 11092220
SPECIMEN QUALITY:: ADEQUATE

## 2020-10-18 LAB — HEMOGLOBIN A1C
Hgb A1c MFr Bld: 5.7 % of total Hgb — ABNORMAL HIGH (ref <5.7)
Mean Plasma Glucose: 117 (calc)
eAG (mmol/L): 6.5 (calc)

## 2020-10-18 LAB — VITAMIN B12: Vitamin B-12: 830 pg/mL (ref 200–1100)

## 2020-10-18 LAB — IRON, TOTAL/TOTAL IRON BINDING CAP
%SAT: 14 % (calc) — ABNORMAL LOW (ref 16–45)
Iron: 56 ug/dL (ref 45–160)
TIBC: 409 mcg/dL (calc) (ref 250–450)

## 2020-10-18 LAB — TSH: TSH: 1.26 mIU/L (ref 0.40–4.50)

## 2020-10-18 LAB — MAGNESIUM: Magnesium: 2.2 mg/dL (ref 1.5–2.5)

## 2020-10-18 LAB — VITAMIN D 25 HYDROXY (VIT D DEFICIENCY, FRACTURES): Vit D, 25-Hydroxy: 97 ng/mL (ref 30–100)

## 2020-10-27 ENCOUNTER — Other Ambulatory Visit: Payer: Self-pay | Admitting: Internal Medicine

## 2020-11-08 ENCOUNTER — Ambulatory Visit: Payer: BC Managed Care – PPO | Admitting: Family Medicine

## 2020-11-15 ENCOUNTER — Other Ambulatory Visit: Payer: Self-pay | Admitting: Adult Health Nurse Practitioner

## 2020-11-15 DIAGNOSIS — H9313 Tinnitus, bilateral: Secondary | ICD-10-CM

## 2020-11-16 ENCOUNTER — Encounter: Payer: Self-pay | Admitting: Orthopedic Surgery

## 2020-11-16 NOTE — Telephone Encounter (Signed)
She would likely need to come back in for repeat clinical appointment just so the MRI scan is not denied.  Thanks

## 2020-12-10 ENCOUNTER — Other Ambulatory Visit: Payer: Self-pay | Admitting: Internal Medicine

## 2020-12-10 DIAGNOSIS — M545 Low back pain, unspecified: Secondary | ICD-10-CM

## 2020-12-10 DIAGNOSIS — R079 Chest pain, unspecified: Secondary | ICD-10-CM

## 2020-12-11 ENCOUNTER — Encounter: Payer: Self-pay | Admitting: Adult Health

## 2020-12-11 ENCOUNTER — Ambulatory Visit
Admission: RE | Admit: 2020-12-11 | Discharge: 2020-12-11 | Disposition: A | Discharge status: Home / Self Care | Payer: BC Managed Care – PPO | Source: Ambulatory Visit | Attending: Internal Medicine | Admitting: Internal Medicine

## 2020-12-11 ENCOUNTER — Other Ambulatory Visit: Payer: Self-pay

## 2020-12-11 ENCOUNTER — Ambulatory Visit: Payer: BC Managed Care – PPO | Admitting: Adult Health

## 2020-12-11 VITALS — BP 122/72 | HR 70 | Temp 95.7°F | Wt 171.0 lb

## 2020-12-11 DIAGNOSIS — M545 Low back pain, unspecified: Secondary | ICD-10-CM

## 2020-12-11 DIAGNOSIS — R079 Chest pain, unspecified: Secondary | ICD-10-CM | POA: Diagnosis not present

## 2020-12-11 MED ORDER — TIZANIDINE HCL 4 MG PO TABS: 4.0000 mg | ORAL_TABLET | Freq: Three times a day (TID) | ORAL | 1 refills | Status: DC

## 2020-12-11 MED ORDER — DEXAMETHASONE SODIUM PHOSPHATE 100 MG/10ML IJ SOLN
10.0000 mg | Freq: Once | INTRAMUSCULAR | Status: AC
Start: 2020-12-11 — End: 2020-12-11
  Administered 2020-12-11: 10 mg via INTRAMUSCULAR

## 2020-12-11 MED ORDER — MUPIROCIN CALCIUM 2 % EX CREA: TOPICAL_CREAM | CUTANEOUS | 0 refills | Status: DC

## 2020-12-11 NOTE — Progress Notes (Signed)
Assessment and Plan:  Caera was seen today for back pain and chest pain.  Diagnoses and all orders for this visit:  Lumbar back pain Xray results pending, exam suggestive of strain with spasm negative straight leg, no bowel/bladder problems RICE, and exercise given Declines NSAID, steroid taper - Decadron IM given, try tylenol, but if not improving follow up, plan to give meloxicam Proper lifting, bending technique discussed. Stretching exercises discussed. Regular aerobic and trunk strengthening exercises discussed. Ice to affected area as needed for local pain relief. Heat to affected area as needed for local pain relief. Muscle relaxants per medication orders. Suggested massage therapy If not better with refer to orthopedics.  -     tiZANidine (ZANAFLEX) 4 MG tablet; Take 1 tablet (4 mg total) by mouth 3 (three) times daily. -     dexamethasone (DECADRON) injection 10 mg  Further disposition pending results of labs. Discussed med's effects and SE's.   Over 30 minutes of exam, counseling, chart review, and critical decision making was performed.   Future Appointments  Date Time Provider [place]  02/04/2021  8:45 AM Garnet Sierras, NP [facility] None  10/16/2021  9:00 AM McClanahan, Danton Sewer, NP [facility] None    ------------------------------------------------------------------------------------------------------------------   HPI BP 122/72   Pulse 70   Temp (!) 95.7 F (35.4 C)   Wt 171 lb (77.6 kg)   LMP 06/10/2010   SpO2 98%   BMI 32.31 kg/m   64 y.o.female presents for evaluation due to 3-4 weeks of sensation of lump in her back when she leans back (moves, lower to mid back), also sense of tightness in her muscles, some tightness of lower ribs bilaterally with movement, also with straining for BM (seeing GI for constipation). She has tried heat with some benefit, hasn't tried any OTC analgesics. Denies recent injury or event prior to onset.   Denies  numbness/tingling, weakness, loss of bladder/bowel control.   Dr. Melford Aase ordered CXR and lumbar xray, had today, results pending.   Follows with ortho ? Dr. Marlou Sa for neck/upper extremity radicular sx  Past Medical History:  Diagnosis Date  . Acid reflux   . Arthritis   . Cancer ([place])    Left Breast  . Complication of anesthesia    woke  up X 1  . Fatty liver   . High cholesterol   . Hypertension   . Vitamin D deficiency      Allergies  Allergen Reactions  . Lipitor [Atorvastatin] Anaphylaxis and Swelling    Other reaction(s): Other (See Comments) Unknown  . Effexor [Venlafaxine]     Other reaction(s): Other (See Comments)  . Paxil [Paroxetine Hcl]     Other reaction(s): Other (See Comments)  . Wellbutrin [Bupropion]     Other reaction(s): Other (See Comments) Hives Unknown Hives    Current Outpatient Medications on File Prior to Visit  Medication Sig  . Cholecalciferol (VITAMIN D3) 2000 UNITS capsule Take 5,000 mg by mouth.  . ezetimibe (ZETIA) 10 MG tablet TAKE 1 TABLET BY MOUTH EVERY DAY FOR CHOLESTEROL  . famotidine (PEPCID) 40 MG tablet TAKE 1 TABLET 2 X /DAY FOR ACID INDIGESTION & REFLUX  . fluticasone (FLONASE) 50 MCG/ACT nasal spray SPRAY 2 SPRAYS INTO EACH NOSTRIL EVERY DAY  . nystatin cream (MYCOSTATIN) Apply 1 application topically 2 (two) times daily. (Patient not taking: Reported on 12/11/2020)   No current facility-administered medications on file prior to visit.    ROS: all negative except above.   Physical Exam:  BP  122/72   Pulse 70   Temp (!) 95.7 F (35.4 C)   Wt 171 lb (77.6 kg)   LMP 06/10/2010   SpO2 98%   BMI 32.31 kg/m   General Appearance: Well nourished, well dressed female in no apparent distress. Eyes: PERRLA, EOMs, conjunctiva no swelling or erythema Sinuses: No Frontal/maxillary tenderness ENT/Mouth: Ext aud canals clear, TMs without erythema, bulging. No erythema, swelling, or exudate on post pharynx.  Tonsils not swollen  or erythematous. Hearing normal.  Neck: Supple Respiratory: Respiratory effort normal, BS equal bilaterally without rales, rhonchi, wheezing or stridor.  Cardio: RRR with no MRGs. Brisk peripheral pulses without edema.  Abdomen: Soft, + BS.  Non tender, no guarding, rebound, hernias, masses. Lymphatics: Non tender without lymphadenopathy.  Musculoskeletal: Patient is able to ambulate well. Gait is not  Antalgic. Straight leg raising with dorsiflexion negative bilaterally for radicular symptoms. Sensory exam in the legs are normal. Knee reflexes are normal Ankle reflexes are normal Strength is normal and symmetric in legs. There is not SI tenderness to palpation.  There is paraspinal muscle spasm.  There is not midline tenderness.  ROM of spine with  limited in all spheres due to pain.  Skin: Warm, dry without rashes, lesions, ecchymosis.  Neuro: Normal muscle tone, Sensation intact.  Psych: Awake and oriented X 3, normal affect, Insight and Judgment appropriate.     Izora Ribas, NP 4:01 PM [facility]

## 2020-12-11 NOTE — Patient Instructions (Signed)
[employer] - ask insurance if they cover, can get at [facility] or [facility]     Muscle Cramps and Spasms Muscle cramps and spasms occur when a muscle or muscles tighten and you have no control over this tightening (involuntary muscle contraction). They are a common problem and can develop in any muscle. The most common place is in the calf muscles of the leg. Muscle cramps and muscle spasms are both involuntary muscle contractions, but there are some differences between the two:  Muscle cramps are painful. They come and go and may last for a few seconds or up to 15 minutes. Muscle cramps are often more forceful and last longer than muscle spasms.  Muscle spasms may or may not be painful. They may also last just a few seconds or much longer. Certain medical conditions, such as diabetes or Parkinson's disease, can make it more likely to develop cramps or spasms. However, cramps or spasms are usually not caused by a serious underlying problem. Common causes include:  Doing more physical work or exercise than your body is ready for (overexertion).  Overuse from repeating certain movements too many times.  Remaining in a certain position for a long period of time.  Improper preparation, form, or technique while playing a sport or doing an activity.  Dehydration.  Injury.  Side effects of some medicines.  Abnormally low levels of the salts and minerals in your blood (electrolytes), especially potassium and calcium. This could happen if you are taking water pills (diuretics) or if you are pregnant. In many cases, the cause of muscle cramps or spasms is not known. Follow these instructions at home: Managing pain and stiffness      Try massaging, stretching, and relaxing the affected muscle. Do this for several minutes at a time.  If directed, apply heat to tight or tense muscles as often as told by your health care provider. Use the heat source that your health care provider recommends,  such as a moist heat pack or a heating pad. ? Place a towel between your skin and the heat source. ? Leave the heat on for 20-30 minutes. ? Remove the heat if your skin turns bright red. This is especially important if you are unable to feel pain, heat, or cold. You may have a greater risk of getting burned.  If directed, put ice on the affected area. This may help if you are sore or have pain after a cramp or spasm. ? Put ice in a plastic bag. ? Place a towel between your skin and the bag. ? Leavethe ice on for 20 minutes, 2-3 times a day.  Try taking hot showers or baths to help relax tight muscles. Eating and drinking  Drink enough fluid to keep your urine pale yellow. Staying well hydrated may help prevent cramps or spasms.  Eat a healthy diet that includes plenty of nutrients to help your muscles function. A healthy diet includes fruits and vegetables, lean protein, whole grains, and low-fat or nonfat dairy products. General instructions  If you are having frequent cramps, avoid intense exercise for several days.  Take over-the-counter and prescription medicines only as told by your health care provider.  Pay attention to any changes in your symptoms.  Keep all follow-up visits as told by your health care provider. This is important. Contact a health care provider if:  Your cramps or spasms get more severe or happen more often.  Your cramps or spasms do not improve over time. Summary  Muscle cramps and spasms occur when a muscle or muscles tighten and you have no control over this tightening (involuntary muscle contraction).  The most common place for cramps or spasms to occur is in the calf muscles of the leg.  Massaging, stretching, and relaxing the affected muscle may relieve the cramp or spasm.  Drink enough fluid to keep your urine pale yellow. Staying well hydrated may help prevent cramps or spasms. This information is not intended to replace advice given to you by  your health care provider. Make sure you discuss any questions you have with your health care provider. Document Revised: 05/10/2018 Document Reviewed: 05/10/2018 Elsevier Patient Education  [place].    Zoster Vaccine, Recombinant injection What is this medicine? ZOSTER VACCINE (ZOS ter vak SEEN) is used to prevent shingles in adults 65 years old and over. This vaccine is not used to treat shingles or nerve pain from shingles. This medicine may be used for other purposes; ask your health care provider or pharmacist if you have questions. COMMON BRAND NAME(S): [facility] What should I tell my health care provider before I take this medicine? They need to know if you have any of these conditions:  blood disorders or disease  cancer like leukemia or lymphoma  immune system problems or therapy  an unusual or allergic reaction to vaccines, other medications, foods, dyes, or preservatives  pregnant or trying to get pregnant  breast-feeding How should I use this medicine? This vaccine is for injection in a muscle. It is given by a health care professional. Talk to your pediatrician regarding the use of this medicine in children. This medicine is not approved for use in children. Overdosage: If you think you have taken too much of this medicine contact a poison control center or emergency room at once. NOTE: This medicine is only for you. Do not share this medicine with others. What if I miss a dose? Keep appointments for follow-up (booster) doses as directed. It is important not to miss your dose. Call your doctor or health care professional if you are unable to keep an appointment. What may interact with this medicine?  medicines that suppress your immune system  medicines to treat cancer  steroid medicines like prednisone or cortisone This list may not describe all possible interactions. Give your health care provider a list of all the medicines, herbs, non-prescription  drugs, or dietary supplements you use. Also tell them if you smoke, drink alcohol, or use illegal drugs. Some items may interact with your medicine. What should I watch for while using this medicine? Visit your doctor for regular check ups. This vaccine, like all vaccines, may not fully protect everyone. What side effects may I notice from receiving this medicine? Side effects that you should report to your doctor or health care professional as soon as possible:  allergic reactions like skin rash, itching or hives, swelling of the face, lips, or tongue  breathing problems Side effects that usually do not require medical attention (report these to your doctor or health care professional if they continue or are bothersome):  chills  headache  fever  nausea, vomiting  redness, warmth, pain, swelling or itching at site where injected  tiredness This list may not describe all possible side effects. Call your doctor for medical advice about side effects. You may report side effects to FDA at 1-800-FDA-1088. Where should I keep my medicine? This vaccine is only given in a clinic, pharmacy, doctor's office, or other health care  setting and will not be stored at home. NOTE: This sheet is a summary. It may not cover all possible information. If you have questions about this medicine, talk to your doctor, pharmacist, or health care provider.  2020 Elsevier/Gold Standard (2017-07-27 13:20:30)

## 2020-12-12 NOTE — Progress Notes (Signed)
========================================================== ==========================================================  -    Lumbar spine Xrays show Arthritic changes in lower lumbar spine area..  - No signs of cancers in bones

## 2020-12-12 NOTE — Progress Notes (Signed)
========================================================== ==========================================================  -    CXR shows Normal Heart, Lungs & no signsof cancer in bones.

## 2020-12-13 ENCOUNTER — Other Ambulatory Visit: Payer: Self-pay | Admitting: Internal Medicine

## 2020-12-13 MED ORDER — DEXAMETHASONE 4 MG PO TABS
ORAL_TABLET | ORAL | 0 refills | Status: DC
Start: 2020-12-13 — End: 2021-02-14

## 2021-01-03 ENCOUNTER — Other Ambulatory Visit: Payer: Self-pay | Admitting: Adult Health

## 2021-01-03 DIAGNOSIS — M545 Low back pain, unspecified: Secondary | ICD-10-CM

## 2021-01-12 ENCOUNTER — Other Ambulatory Visit: Payer: Self-pay | Admitting: Internal Medicine

## 2021-01-12 DIAGNOSIS — M545 Low back pain, unspecified: Secondary | ICD-10-CM

## 2021-01-21 DIAGNOSIS — H938X1 Other specified disorders of right ear: Secondary | ICD-10-CM | POA: Diagnosis not present

## 2021-01-21 DIAGNOSIS — H6983 Other specified disorders of Eustachian tube, bilateral: Secondary | ICD-10-CM | POA: Diagnosis not present

## 2021-01-21 DIAGNOSIS — H903 Sensorineural hearing loss, bilateral: Secondary | ICD-10-CM | POA: Diagnosis not present

## 2021-01-21 DIAGNOSIS — J300 Vasomotor rhinitis: Secondary | ICD-10-CM | POA: Diagnosis not present

## 2021-02-04 ENCOUNTER — Ambulatory Visit: Payer: BC Managed Care – PPO | Admitting: Adult Health Nurse Practitioner

## 2021-02-08 ENCOUNTER — Other Ambulatory Visit: Payer: Self-pay | Admitting: Internal Medicine

## 2021-02-08 DIAGNOSIS — K8050 Calculus of bile duct without cholangitis or cholecystitis without obstruction: Secondary | ICD-10-CM

## 2021-02-13 ENCOUNTER — Ambulatory Visit: Payer: BC Managed Care – PPO | Admitting: Adult Health Nurse Practitioner

## 2021-02-13 NOTE — Progress Notes (Signed)
Assessment and Plan:  Jenna Miller was seen today for flank pain.  Diagnoses and all orders for this visit:  Upper abdominal pain Epigastric abdominal tenderness without rebound tenderness HPI most suggestive of gastric etiology, ? Possible ulcer Discussed recommendation to r/o H. Pylori, cannot check today due to recent pepto bismol use; offered GI referral for EGD/biopsy but declines; will have her do only famotidine x 2 weeks then return for h. Pylori breath test per her preference, then start tx if indicated vs omeprazole 20 mg BID x 2 weeks to evaluate response Also check upper abdominal organ labs, abd [place] to evaluate upper abdominal organs with hx of fatty liver, no recent imaging GERD lifestyle reviewed; avoid ETOH, NSAIDS  The patient was advised to call immediately if Jenna Miller has any concerning symptoms in the interval. The patient voices understanding of current treatment options and is in agreement with the current care plan.The patient knows to call the clinic with any problems, questions or concerns or go to the ER if any further progression of symptoms.  -     CBC with Differential/Platelet -     COMPLETE METABOLIC PANEL WITH GFR -     Urinalysis w microscopic + reflex cultur -     Lipase -     H. pylori breath test; Future  History of adenomatous polyp of colon Due follow up colonoscopy 09/2021 If abdominal pain remains unexplained, plan to refer back sooner   Further disposition pending results of labs. Discussed med's effects and SE's.   Over 30 minutes of exam, counseling, chart review, and critical decision making was performed.   Future Appointments  Date Time Provider [place]  02/28/2021  8:45 AM [facility] None  03/01/2021  8:30 AM [facility] [place] [facility]  10/16/2021  9:00 AM [place], Danton Sewer, NP [facility] None    ------------------------------------------------------------------------------------------------------------------   HPI BP  122/66   Pulse 75   Temp (!) 97.3 F (36.3 C)   Wt 167 lb 12.8 oz (76.1 kg)   LMP 06/10/2010   SpO2 98%   BMI 31.71 kg/m   65 y.o.female with hx of left breast cancer s/p mastectomy with TRAM, cholecystectomy, fatty liver, arthritis, reflux presents for evaluation of upper abdominal/R lower chest pain.   Jenna Miller reports this began in December 2021, recalls this originally seemed worse after eating, intermittent, right anterior chest/upper abdominal, sharp waves, intermittently radiates around to back/flank; each episode lasts 1-2 hours; has been progressive; recently notes onset within 1 hour of eating, typically will last 2 hours then resolve. However Jenna Miller has also noted recently worse with lifting and vacuuming. Jenna Miller has noted heat sometimes helps.   Stopping eating didn't help. Jenna Miller takes famotidine twice daily for GERD. Denies ETOH, NSAID use.  Occasionally takes pepto bismol and finds this to be helpful. Endorses increased belching/burping, even after drinking water in the last week which is atypical for her. Denies nausea.   Jenna Miller does note chronic constipation, reports is improved on linzess via GI provider. Reports has week had 4-5 days of diarrhea but this resolved, back to typical constipation. Denies blood in stool, black stools. Last colonoscopy 10/06/2016 by Dr. Earlean Shawl with 1 tubular adenoma removed, had normal EGD at that time. 5 year recall -   Jenna Miller had CXR and lumbar spine imaging 12/11/2020 after onset of sx that were benign excepting mild lumbar arthritic changes.   S/p cholecystectomy in 2013. Hx of fatty liver per [place] 2013 without recent imaging.   BMI is  Body mass index is 31.71 kg/m., Denies fatigue, night sweats. Sx are never exertional. Denies hx of renal stones. No evidence of stones CT 12/02/2013. Denies urinary sx.  Wt Readings from Last 3 Encounters:  02/14/21 167 lb 12.8 oz (76.1 kg)  12/11/20 171 lb (77.6 kg)  10/16/20 170 lb (77.1 kg)   Hx of fatty liver Lab Results   Component Value Date   ALT 14 10/16/2020   AST 13 10/16/2020   ALKPHOS 52 02/20/2017   BILITOT 0.5 10/16/2020     Past Medical History:  Diagnosis Date  . Acid reflux   . Arthritis   . Cancer ([place])    Left Breast  . Complication of anesthesia    woke  up X 1  . Fatty liver   . High cholesterol   . Hypertension   . Vitamin D deficiency      Allergies  Allergen Reactions  . Lipitor [Atorvastatin] Anaphylaxis and Swelling    Other reaction(s): Other (See Comments) Unknown  . Effexor [Venlafaxine]     Other reaction(s): Other (See Comments)  . Paxil [Paroxetine Hcl]     Other reaction(s): Other (See Comments)  . Wellbutrin [Bupropion]     Other reaction(s): Other (See Comments) Hives Unknown Hives    Current Outpatient Medications on File Prior to Visit  Medication Sig  . Cholecalciferol (VITAMIN D3) 2000 UNITS capsule Take 5,000 mg by mouth.  . ezetimibe (ZETIA) 10 MG tablet TAKE 1 TABLET BY MOUTH EVERY DAY FOR CHOLESTEROL  . famotidine (PEPCID) 40 MG tablet TAKE 1 TABLET 2 X /DAY FOR ACID INDIGESTION & REFLUX  . fluticasone (FLONASE) 50 MCG/ACT nasal spray SPRAY 2 SPRAYS INTO EACH NOSTRIL EVERY DAY  . linaclotide (LINZESS) 290 MCG CAPS capsule Take 290 mcg by mouth daily before breakfast.  . tiZANidine (ZANAFLEX) 4 MG tablet TAKE 1 TABLET 3 X /DAY AS NEEDED FOR MUSCLE SPASM  . mupirocin cream (BACTROBAN) 2 % Apply to affected area 3 times daily (Patient not taking: Reported on 02/14/2021)  . nystatin cream (MYCOSTATIN) Apply 1 application topically 2 (two) times daily. (Patient not taking: No sig reported)   No current facility-administered medications on file prior to visit.   Allergies:  Allergies  Allergen Reactions  . Lipitor [Atorvastatin] Anaphylaxis and Swelling    Other reaction(s): Other (See Comments) Unknown  . Effexor [Venlafaxine]     Other reaction(s): Other (See Comments)  . Paxil [Paroxetine Hcl]     Other reaction(s): Other (See Comments)   . Wellbutrin [Bupropion]     Other reaction(s): Other (See Comments) Hives Unknown Hives   Surgical History:  Jenna Miller  has a past surgical history that includes TRAM (1999-2000); Knee arthroscopy (1982); Wisdom tooth extraction; Cholecystectomy (11/19/2012); Breast surgery (1995); Hernia repair; and Mastectomy (Left, 1995). Family History:  Herfamily history includes Cancer in her cousin; Hyperlipidemia in her brother and mother; Hypertension in her brother and mother. Social History:   reports that Jenna Miller has never smoked. Jenna Miller has never used smokeless tobacco. Jenna Miller reports current alcohol use of about 1.0 standard drink of alcohol per week. Jenna Miller reports that Jenna Miller does not use drugs.    ROS: Review of Systems  Constitutional: Negative for chills, diaphoresis, fever, malaise/fatigue and weight loss.  HENT: Negative.   Eyes: Negative.   Respiratory: Negative for cough, sputum production, shortness of breath and wheezing.   Cardiovascular: Negative for chest pain, palpitations, leg swelling and PND.  Gastrointestinal: Positive for abdominal pain, constipation (chronic) and heartburn. Negative for  blood in stool, diarrhea, melena, nausea and vomiting.  Genitourinary: Negative.  Negative for dysuria, flank pain, frequency, hematuria and urgency.  Musculoskeletal: Negative for back pain and falls.  Skin: Negative for rash.     Physical Exam:  BP 122/66   Pulse 75   Temp (!) 97.3 F (36.3 C)   Wt 167 lb 12.8 oz (76.1 kg)   LMP 06/10/2010   SpO2 98%   BMI 31.71 kg/m   General Appearance: Well nourished, in no apparent distress. Eyes: PERRLA, EOMs, conjunctiva no swelling or erythema Sinuses: No Frontal/maxillary tenderness ENT/Mouth: Ext aud canals clear, TMs without erythema, bulging. No erythema, swelling, or exudate on post pharynx.  Tonsils not swollen or erythematous. Hearing normal.  Neck: Supple Respiratory: Respiratory effort normal, BS equal bilaterally without rales, rhonchi,  wheezing or stridor.  Cardio: RRR with no MRGs. Brisk peripheral pulses without edema.  Abdomen: Soft, non-distended, + BS.  Mild epigastric tenderness, no guarding, rebound, palpable hernias, masses. No CVA tenderness.  Lymphatics: Non tender without lymphadenopathy.  Musculoskeletal: No obvious deformity, no spinous tenderness, good ROM, normal gait. Non-tender lower ribs/chest wall/sternum.  Skin: Warm, dry without rashes, lesions, ecchymosis.  Neuro: Normal muscle tone Psych: Awake and oriented X 3, flax affect, Insight and Judgment appropriate.     Izora Ribas, NP 12:50 PM [facility]

## 2021-02-14 ENCOUNTER — Ambulatory Visit: Payer: BC Managed Care – PPO | Admitting: Adult Health

## 2021-02-14 ENCOUNTER — Other Ambulatory Visit: Payer: Self-pay

## 2021-02-14 ENCOUNTER — Encounter: Payer: Self-pay | Admitting: Adult Health

## 2021-02-14 VITALS — BP 122/66 | HR 75 | Temp 97.3°F | Wt 167.8 lb

## 2021-02-14 DIAGNOSIS — R1010 Upper abdominal pain, unspecified: Secondary | ICD-10-CM

## 2021-02-14 DIAGNOSIS — Z86010 Personal history of colonic polyps: Secondary | ICD-10-CM | POA: Diagnosis not present

## 2021-02-14 DIAGNOSIS — R10816 Epigastric abdominal tenderness: Secondary | ICD-10-CM | POA: Diagnosis not present

## 2021-02-14 NOTE — Patient Instructions (Addendum)
We are checking upper abominal labs today  However it may be possible that you have an ulcer Will check H. Pylori in 2 weeks - please avoid ALL stomach acid medications except for famotidine (including pepto bismol) until you complete     YOU CAN CALL TO MAKE AN ULTRASOUND..  I have put in an order for an ultrasound for you to have You can set them up at your convenience by calling this number [phone] You will likely have the ultrasound at [address] [facility]  If you have any issues call our office and we will set this up for you.       Helicobacter Pylori Antibodies Test Why am I having this test? This test is used to check for a type of bacteria called Helicobacter pylori (H. pylori). H. pylori can be found in the cells that line the stomach. Having high levels of H. pylori in your stomach puts you at risk for:  Stomach ulcers and small bowel ulcers.  Long-term (chronic) inflammation of the lining of the stomach.  Ulcers in the part of the body that moves food from the mouth to the stomach (esophagus).  Stomach cancer if the infection is not treated. Most people with H. pylori in their stomach have no symptoms. Your health care provider may ask you to have this test if you have symptoms of a stomach ulcer or small bowel ulcer, such as stomach pain before or after eating, heartburn, or nausea after eating. What is being tested? This test checks your blood for antibodies to the H. pylori bacteria. Antibodies are proteins made by your immune system to fight germs and infection. The test checks for antibodies that the immune system produces in response to infection with H. pylori. What kind of sample is taken? A blood sample is required for this test. It is usually collected by inserting a needle into a blood vessel or by sticking a finger with a small needle.   Tell a health care provider about:  All medicines you are taking, including vitamins, herbs, eye drops,  creams, and over-the-counter medicines. How are the results reported? Your test results will be reported as values that are categorized as positive, negative, or equivocal. Equivocal means that your results are neither positive nor negative. Your health care provider will compare your results to normal ranges that were established after testing a large group of people (reference ranges). Reference ranges may vary among labs and hospitals. For this test, common reference ranges for the two types of antibodies that may be tested are:  IgG antibodies: ? Less than 0.75 units/mL. This is negative. ? Greater than or equal to 1 unit/mL. This is positive. ? 0.75-0.99 units/mL. This is equivocal.  IgM antibodies: ? Less than or equal to 30 units/mL. This is negative. ? Greater than or equal to 40 units/mL. This is positive. ? 30.01-39.99 units/mL. This is equivocal. What do the results mean? Test results that are higher than normal, or positive, may indicate various health conditions, such as:  Short-term or long-term irritation of the stomach lining (gastritis).  Small bowel ulcer.  Stomach ulcer.  Stomach cancer. Talk with your health care provider about what your results mean. Questions to ask your health care provider Ask your health care provider, or the department that is doing the test:  When will my results be ready?  How will I get my results?  What are my treatment options?  What other tests do  I need?  What are my next steps? Summary  This test is used to check for a type of bacteria called Helicobacter pylori (H. pylori). Having high levels of H. pylori in your stomach puts you at risk for ulcers in the gastrointestinal tract or stomach cancer.  This test checks your blood for antibodies to the H. pylori bacteria.  Most people with H. pylori in their stomach have no symptoms. Your health care provider may ask you to have this test if you have symptoms of a stomach ulcer  or small bowel ulcer, such as stomach pain before or after eating, heartburn, or nausea after eating.  Talk with your health care provider about what your results mean. This information is not intended to replace advice given to you by your health care provider. Make sure you discuss any questions you have with your health care provider. Document Revised: 11/27/2017 Document Reviewed: 07/28/2017 Elsevier Patient Education  2021 [place].         Peptic Ulcer  A peptic ulcer is a sore in the lining of the stomach (gastric ulcer) or the first part of the small intestine (duodenal ulcer). The ulcer causes a gradual wearing away (erosion) of the deeper tissue. What are the causes? Normally, the lining of the stomach and the small intestine protects them from the acid that digests food. The protective lining can be damaged by:  An infection caused by a type of bacteria called Helicobacter pylori or H. pylori.  Regular use of NSAIDs, such as ibuprofen or aspirin.  Rare tumors in the stomach, small intestine, or pancreas (Zollinger-Ellison syndrome). What increases the risk? The following factors may make you more likely to develop this condition:  Smoking.  Having a family history of ulcer disease.  Drinking alcohol.  Having been hospitalized in an intensive care unit (ICU). What are the signs or symptoms? Symptoms of this condition include:  Persistent burning pain in the area between the chest and the belly button. The pain may be worse on an empty stomach and at night.  Heartburn.  Nausea and vomiting.  Bloating. If the ulcer results in bleeding, it can cause:  Black, tarry stools.  Vomiting of bright red blood.  Vomiting of material that looks like coffee grounds. How is this diagnosed? This condition may be diagnosed based on:  Your medical history and a physical exam.  Various tests or procedures, such as: ? Blood tests, stool tests, or breath tests to  check for the H. pylori bacteria. ? An X-ray exam (upper gastrointestinal series) of the esophagus, stomach, and small intestine. ? Upper endoscopy. The health care provider examines the esophagus, stomach, and small intestine using a small flexible tube that has a video camera at the end. ? Biopsy. A tissue sample is removed to be examined under a microscope. How is this treated? Treatment for this condition may include:  Eliminating the cause of the ulcer, such as smoking or use of NSAIDs, and limiting alcohol and caffeine intake.  Medicines to reduce the amount of acid in your digestive tract.  Antibiotic medicines, if the ulcer is caused by an H. pylori infection.  An upper endoscopy may be used to treat a bleeding ulcer.  Surgery. This may be needed if the bleeding is severe or if the ulcer created a hole somewhere in the digestive system. Follow these instructions at home:  Do not drink alcohol if your health care provider tells you not to drink.  Do not use any  products that contain nicotine or tobacco, such as cigarettes, e-cigarettes, and chewing tobacco. If you need help quitting, ask your health care provider.  Take over-the-counter and prescription medicines only as told by your health care provider. ? Do not use over-the-counter medicines in place of prescription medicines unless your health care provider approves. ? Do not take aspirin, ibuprofen, or other NSAIDs unless your health care provider told you to do so.  Take over-the-counter and prescription medicines only as told by your health care provider.  Keep all follow-up visits as told by your health care provider. This is important. Contact a health care provider if:  Your symptoms do not improve within 7 days of starting treatment.  You have ongoing indigestion or heartburn. Get help right away if:  You have sudden, sharp, or persistent pain in your abdomen.  You have bloody or dark black, tarry  stools.  You vomit blood or material that looks like coffee grounds.  You become light-headed or you feel faint.  You become weak.  You become sweaty or clammy. Summary  A peptic ulcer is a sore in the lining of the stomach (gastric ulcer) or the first part of the small intestine (duodenal ulcer). The ulcer causes a gradual wearing away (erosion) of the deeper tissue.  Do not use any products that contain nicotine or tobacco, such as cigarettes, e-cigarettes, and chewing tobacco. If you need help quitting, ask your health care provider.  Take over-the-counter and prescription medicines only as told by your health care provider. Do not use over-the-counter medicines in place of prescription medicines unless your health care provider approves.  Contact your health care provider if you have ongoing indigestion or heartburn.  Keep all follow-up visits as told by your health care provider. This is important. This information is not intended to replace advice given to you by your health care provider. Make sure you discuss any questions you have with your health care provider. Document Revised: 06/22/2018 Document Reviewed: 06/22/2018 Elsevier Patient Education  2021 [employer].

## 2021-02-15 ENCOUNTER — Other Ambulatory Visit: Payer: Self-pay | Admitting: Adult Health

## 2021-02-15 ENCOUNTER — Other Ambulatory Visit (HOSPITAL_COMMUNITY): Payer: Self-pay | Admitting: Adult Health

## 2021-02-15 DIAGNOSIS — R7989 Other specified abnormal findings of blood chemistry: Secondary | ICD-10-CM

## 2021-02-15 DIAGNOSIS — R1010 Upper abdominal pain, unspecified: Secondary | ICD-10-CM

## 2021-02-16 LAB — CBC WITH DIFFERENTIAL/PLATELET
Absolute Monocytes: 400 cells/uL (ref 200–950)
Basophils Absolute: 12 cells/uL (ref 0–200)
Basophils Relative: 0.3 %
Eosinophils Absolute: 32 cells/uL (ref 15–500)
Eosinophils Relative: 0.8 %
HCT: 36.7 % (ref 35.0–45.0)
Hemoglobin: 12.2 g/dL (ref 11.7–15.5)
Lymphs Abs: 884 cells/uL (ref 850–3900)
MCH: 29.7 pg (ref 27.0–33.0)
MCHC: 33.2 g/dL (ref 32.0–36.0)
MCV: 89.3 fL (ref 80.0–100.0)
MPV: 11.1 fL (ref 7.5–12.5)
Monocytes Relative: 10 %
Neutro Abs: 2672 cells/uL (ref 1500–7800)
Neutrophils Relative %: 66.8 %
Platelets: 147 10*3/uL (ref 140–400)
RBC: 4.11 10*6/uL (ref 3.80–5.10)
RDW: 13.1 % (ref 11.0–15.0)
Total Lymphocyte: 22.1 %
WBC: 4 10*3/uL (ref 3.8–10.8)

## 2021-02-16 LAB — URINALYSIS W MICROSCOPIC + REFLEX CULTURE
Bacteria, UA: NONE SEEN /HPF
Bilirubin Urine: NEGATIVE
Glucose, UA: NEGATIVE
Hgb urine dipstick: NEGATIVE
Hyaline Cast: NONE SEEN /LPF
Nitrites, Initial: NEGATIVE
Protein, ur: NEGATIVE
Specific Gravity, Urine: 1.017 (ref 1.001–1.03)
Squamous Epithelial / HPF: NONE SEEN /HPF (ref <=5)
pH: 5.5 (ref 5.0–8.0)

## 2021-02-16 LAB — COMPLETE METABOLIC PANEL WITH GFR
AG Ratio: 1.9 (calc) (ref 1.0–2.5)
ALT: 118 U/L — ABNORMAL HIGH (ref 6–29)
AST: 54 U/L — ABNORMAL HIGH (ref 10–35)
Albumin: 4.2 g/dL (ref 3.6–5.1)
Alkaline phosphatase (APISO): 50 U/L (ref 37–153)
BUN: 16 mg/dL (ref 7–25)
CO2: 28 mmol/L (ref 20–32)
Calcium: 9.6 mg/dL (ref 8.6–10.4)
Chloride: 105 mmol/L (ref 98–110)
Creat: 0.75 mg/dL (ref 0.50–0.99)
GFR, Est African American: 97 mL/min/{1.73_m2} (ref >=60)
GFR, Est Non African American: 84 mL/min/{1.73_m2} (ref >=60)
Globulin: 2.2 g/dL (calc) (ref 1.9–3.7)
Glucose, Bld: 93 mg/dL (ref 65–99)
Potassium: 4.5 mmol/L (ref 3.5–5.3)
Sodium: 140 mmol/L (ref 135–146)
Total Bilirubin: 0.5 mg/dL (ref 0.2–1.2)
Total Protein: 6.4 g/dL (ref 6.1–8.1)

## 2021-02-16 LAB — URINE CULTURE
MICRO NUMBER:: 11549471
SPECIMEN QUALITY:: ADEQUATE

## 2021-02-16 LAB — CULTURE INDICATED

## 2021-02-16 LAB — LIPASE: Lipase: 9 U/L (ref 7–60)

## 2021-02-18 ENCOUNTER — Other Ambulatory Visit: Payer: Self-pay

## 2021-02-18 ENCOUNTER — Ambulatory Visit (HOSPITAL_COMMUNITY)
Admission: RE | Admit: 2021-02-18 | Discharge: 2021-02-18 | Disposition: A | Discharge status: Home / Self Care | Payer: BC Managed Care – PPO | Source: Ambulatory Visit | Attending: Adult Health | Admitting: Adult Health

## 2021-02-18 DIAGNOSIS — R1010 Upper abdominal pain, unspecified: Secondary | ICD-10-CM | POA: Diagnosis not present

## 2021-02-18 DIAGNOSIS — R109 Unspecified abdominal pain: Secondary | ICD-10-CM | POA: Diagnosis not present

## 2021-02-19 ENCOUNTER — Ambulatory Visit: Payer: BC Managed Care – PPO | Admitting: Adult Health Nurse Practitioner

## 2021-02-28 ENCOUNTER — Other Ambulatory Visit: Payer: Self-pay

## 2021-02-28 ENCOUNTER — Other Ambulatory Visit: Payer: BC Managed Care – PPO

## 2021-02-28 DIAGNOSIS — R10816 Epigastric abdominal tenderness: Secondary | ICD-10-CM | POA: Diagnosis not present

## 2021-02-28 DIAGNOSIS — R259 Unspecified abnormal involuntary movements: Secondary | ICD-10-CM | POA: Diagnosis not present

## 2021-02-28 DIAGNOSIS — R7989 Other specified abnormal findings of blood chemistry: Secondary | ICD-10-CM | POA: Diagnosis not present

## 2021-02-28 DIAGNOSIS — R1010 Upper abdominal pain, unspecified: Secondary | ICD-10-CM | POA: Diagnosis not present

## 2021-03-01 ENCOUNTER — Other Ambulatory Visit: Payer: BC Managed Care – PPO

## 2021-03-01 LAB — H. PYLORI BREATH TEST: H. pylori Breath Test: NOT DETECTED

## 2021-03-13 NOTE — Progress Notes (Signed)
° °  History of Present Illness:     This 65 yo DWF  With prior hx/o labile HTN (2009) , HLD , PreDiabetes (A1c 6.1% /2013) , Vitamin D Deficiency ("39"/2009) , remote hx/o Lt breast cancer  presents with concerns of poor appetite and weight loss(4 # over last 3 months). Recent labs showed elevated LFT's being repeated today.           Patient has a multitude of complaints including dizziness, vertigo or feeling imbalanced. She reports a recent abrupt onset of a foreward stooped gait with weakness/ clumsiness of her left arm & leg . She says "people" have commented on her gait and that she doesn't swing her arms when she walks anymore.   Wt Readings from Last 3 Encounters:  02/14/21 167 lb 12.8 oz (76.1 kg)  12/11/20 171 lb (77.6 kg)  10/16/20 170 lb (77.1 kg)   Medications    ZETIA 10 MG tablet, TAKE 1 TABLET  EVERY DAY     FLONASE nasal spray, SPRAY 2 SPRAYS INTO EACH NOSTRIL EVERY DAY    VITAMIN D 5,000 u, Take  Daily    PEPCID 40 MG t, TAKE 1 TABLET 2 X /DAY     LINZESS 290 MCG CAPS, Take  daily     tiZANidine (ZANAFLEX) 4 MG tablet, TAKE 1 TABLET 3 X /DAY AS NEEDED   Problem list She has Hypertension; Hyperlipidemia; GERD; Vitamin D deficiency; Fatty liver; CKD Stage 2 (GFR 69 ml/min); Medication management; Pre-diabetes; Morbid Obesity (BMI 32.54); Breast cancer, Left; Constipation; Lightheaded; Dysfunction of right eustachian tube; and History of adenomatous polyp of colon on their problem list.   Observations/Objective:  Ht 5\' 2"  (1.575 m)    LMP 06/10/2010    BMI 30.69 kg/m   ?  masked facies  HEENT - WNL. Neck - supple.  Chest - Clear equal BS. Cor - Nl HS. RRR w/o sig MGR. PP 1(+). No edema. MS- FROM w/o deformities.  Forward stooped sl fenestinating short steppage  gait with reduced arm swing.  Slight beats of cogwheeling , Lt >> Rt UE.  Neuro -  Nl w/o focal abnormalities.  Assessment and Plan:      Follow Up Instructions:       I discussed the  assessment and treatment plan with the patient. The patient was provided an opportunity to ask questions and all were answered. The patient agreed with the plan and demonstrated an understanding of the instructions.      The patient was advised to call back or seek an in-person evaluation if the symptoms worsen or if the condition fails to improve as anticipated.    Kirtland Bouchard, MD

## 2021-03-14 ENCOUNTER — Ambulatory Visit: Payer: BC Managed Care – PPO | Admitting: Internal Medicine

## 2021-03-14 ENCOUNTER — Other Ambulatory Visit: Payer: Self-pay

## 2021-03-14 ENCOUNTER — Encounter: Payer: Self-pay | Admitting: Internal Medicine

## 2021-03-14 VITALS — BP 126/62 | HR 76 | Temp 97.7°F | Resp 16 | Ht 62.0 in | Wt 167.2 lb

## 2021-03-14 DIAGNOSIS — R634 Abnormal weight loss: Secondary | ICD-10-CM

## 2021-03-14 DIAGNOSIS — R1010 Upper abdominal pain, unspecified: Secondary | ICD-10-CM | POA: Diagnosis not present

## 2021-03-14 DIAGNOSIS — R259 Unspecified abnormal involuntary movements: Secondary | ICD-10-CM

## 2021-03-14 DIAGNOSIS — R7989 Other specified abnormal findings of blood chemistry: Secondary | ICD-10-CM | POA: Diagnosis not present

## 2021-03-14 LAB — GAMMA GT: GGT: 17 U/L (ref 3–65)

## 2021-03-15 ENCOUNTER — Encounter: Payer: Self-pay | Admitting: Neurology

## 2021-03-15 LAB — COMPLETE METABOLIC PANEL WITH GFR
AG Ratio: 2.2 (calc) (ref 1.0–2.5)
ALT: 12 U/L (ref 6–29)
AST: 13 U/L (ref 10–35)
Albumin: 4.6 g/dL (ref 3.6–5.1)
Alkaline phosphatase (APISO): 45 U/L (ref 37–153)
BUN: 14 mg/dL (ref 7–25)
CO2: 27 mmol/L (ref 20–32)
Calcium: 10.1 mg/dL (ref 8.6–10.4)
Chloride: 104 mmol/L (ref 98–110)
Creat: 0.81 mg/dL (ref 0.50–0.99)
GFR, Est African American: 88 mL/min/{1.73_m2} (ref >=60)
GFR, Est Non African American: 76 mL/min/{1.73_m2} (ref >=60)
Globulin: 2.1 g/dL (calc) (ref 1.9–3.7)
Glucose, Bld: 93 mg/dL (ref 65–99)
Potassium: 4.1 mmol/L (ref 3.5–5.3)
Sodium: 139 mmol/L (ref 135–146)
Total Bilirubin: 0.5 mg/dL (ref 0.2–1.2)
Total Protein: 6.7 g/dL (ref 6.1–8.1)

## 2021-03-15 LAB — CBC WITH DIFFERENTIAL/PLATELET
Absolute Monocytes: 390 cells/uL (ref 200–950)
Basophils Absolute: 42 cells/uL (ref 0–200)
Basophils Relative: 0.9 %
Eosinophils Absolute: 71 cells/uL (ref 15–500)
Eosinophils Relative: 1.5 %
HCT: 36.2 % (ref 35.0–45.0)
Hemoglobin: 12.3 g/dL (ref 11.7–15.5)
Lymphs Abs: 1495 cells/uL (ref 850–3900)
MCH: 30.5 pg (ref 27.0–33.0)
MCHC: 34 g/dL (ref 32.0–36.0)
MCV: 89.8 fL (ref 80.0–100.0)
MPV: 11.2 fL (ref 7.5–12.5)
Monocytes Relative: 8.3 %
Neutro Abs: 2703 cells/uL (ref 1500–7800)
Neutrophils Relative %: 57.5 %
Platelets: 205 10*3/uL (ref 140–400)
RBC: 4.03 10*6/uL (ref 3.80–5.10)
RDW: 13.1 % (ref 11.0–15.0)
Total Lymphocyte: 31.8 %
WBC: 4.7 10*3/uL (ref 3.8–10.8)

## 2021-03-15 LAB — HEPATITIS B E ANTIBODY: Hep B E Ab: REACTIVE — AB

## 2021-03-15 LAB — HEPATITIS C ANTIBODY
Hepatitis C Ab: NONREACTIVE
SIGNAL TO CUT-OFF: 0 (ref <1.00)

## 2021-03-15 LAB — HEPATITIS A ANTIBODY, TOTAL: Hepatitis A AB,Total: NONREACTIVE

## 2021-03-15 LAB — HEPATITIS B CORE ANTIBODY, TOTAL: Hep B Core Total Ab: REACTIVE — AB

## 2021-03-15 LAB — HEPATITIS B SURFACE ANTIBODY,QUALITATIVE: Hep B S Ab: REACTIVE — AB

## 2021-03-15 NOTE — Progress Notes (Signed)
Assessment/Plan:   Idiopathic Parkinson's disease.  The patient has mild left-sided tremor, bradykinesia, rigidity and mild postural instability.  -We discussed the diagnosis as well as pathophysiology of the disease.  We discussed treatment options as well as prognostic indicators.  Patient education was provided.  -Greater than 50% of the 60 minute visit was spent in counseling answering questions and talking about what to expect now as well as in the future.  We talked about medication options as well as potential future surgical options.  We talked about safety in the home.  -We decided to add carbidopa/levodopa 25/100.  1/2 tab tid x 1 wk, then 1/2 in am & noon & 1 at night for a week, then 1/2 in am &1 at noon &night for a week, then 1 po tid.  Risks, benefits, side effects and alternative therapies were discussed.  The opportunity to ask questions was given and they were answered to the best of my ability.  The patient expressed understanding and willingness to follow the outlined treatment protocols.  -I will refer the patient [facility] for [facility].  We talked about the importance of safe, cardiovascular exercise in Parkinson's disease.  -We discussed community resources in the area including patient support groups and community exercise programs for PD and pt education was provided to the patient.  -Refer to social work for counseling with Dennison Nancy.  -Patient had several questions and I answered those to the best of my ability.  Explained in detail to the patient that this diagnosis does not change lifespan.  She asked about stopping working and I told her that I did not recommend that right now.  She asked about not getting married, and I certainly did not recommend that.  She really was overwhelmed with the diagnosis.  I asked her about obtaining a second opinion and she did not want to do that.  Denies SI.   Subjective:   Jenna Feher  Miller was seen today in the movement disorders clinic for neurologic consultation at the request of Unk Pinto, MD.    Records made available to me are reviewed.  Patient saw primary care physician on March 17.  At that visit, she complained of stooped gait and decreased arm swing.  Primary care physician noted same and she was sent to rule out Parkinson's disease.   Specific Symptoms:  Tremor: Yes.  , only when get cold in the UE bilateral Family hx of similar:  No. Voice: no voice change per pt Sleep: intermittent trouble  Vivid Dreams:  No.  Acting out dreams:  Yes.  , some sleep talking Wet Pillows: No. Postural symptoms:  Yes.   - going on off and on x 8 months - she associates with first covid vaccine.  She thinks that she is slower.  States that L leg is painful and she thinks that it is from straining from painful constipation.    Falls?  No. Bradykinesia symptoms: some speed of gait change intermittently; no shuffling; able to get OOC without trouble Loss of smell:  No. Loss of taste:  No. Urinary Incontinence:  No. Difficulty Swallowing:  No. Handwriting, micrographia: No. Trouble with ADL's:  No.  Trouble buttoning clothing: No. Depression:  No. Memory changes:  No. Hallucinations:  No.  visual distortions: No. N/V:  No. Lightheaded:  No.  Syncope: No. Diplopia:  No. Dyskinesia:  No. Prior exposure to reglan/antipsychotics: No.  Neuroimaging of the brain has not  previously been performed in the recent years (had one more than 30 years ago)   PREVIOUS MEDICATIONS: none to date  ALLERGIES:   Allergies  Allergen Reactions  . Lipitor [Atorvastatin] Anaphylaxis and Swelling    Other reaction(s): Other (See Comments) Unknown  . Effexor [Venlafaxine]     Other reaction(s): Other (See Comments)  . Paxil [Paroxetine Hcl]     Other reaction(s): Other (See Comments)  . Wellbutrin [Bupropion] Hives    CURRENT MEDICATIONS:  Current Outpatient Medications   Medication Instructions  . Cholecalciferol (VITAMIN D3 PO) 5,000 mg, Oral, Daily  . ezetimibe (ZETIA) 10 MG tablet TAKE 1 TABLET BY MOUTH EVERY DAY FOR CHOLESTEROL  . famotidine (PEPCID) 40 MG tablet TAKE 1 TABLET 2 X /DAY FOR ACID INDIGESTION & REFLUX  . fluticasone (FLONASE) 50 MCG/ACT nasal spray SPRAY 2 SPRAYS INTO EACH NOSTRIL EVERY DAY  . linaclotide (LINZESS) 145 mcg, Oral, Daily before breakfast  . Methylcobalamin (B12-ACTIVE) 1 MG CHEW 1 each, Oral, Daily  . tiZANidine (ZANAFLEX) 4 MG tablet TAKE 1 TABLET 3 X /DAY AS NEEDED FOR MUSCLE SPASM    Objective:   VITALS:   Vitals:   03/19/21 0829  BP: 134/68  Pulse: 70  SpO2: 98%  Weight: 168 lb (76.2 kg)  Height: 5' 1.5" (1.562 m)    GEN:  The patient appears stated age and is in NAD. HEENT:  Normocephalic, atraumatic.  The mucous membranes are moist. The superficial temporal arteries are without ropiness or tenderness. CV:  RRR Lungs:  CTAB Neck/HEME:  There are no carotid bruits bilaterally.  Neurological examination:  Orientation: The patient is alert and oriented x3.  Cranial nerves: There is good facial symmetry. There is facial hypomimia.  Extraocular muscles are intact. The visual fields are full to confrontational testing. The speech is fluent and clear. Soft palate rises symmetrically and there is no tongue deviation. Hearing is intact to conversational tone. Sensation: Sensation is intact to light and pinprick throughout (facial, trunk, extremities). Vibration is intact at the bilateral big toe. There is no extinction with double simultaneous stimulation. There is no sensory dermatomal level identified. Motor: Strength is 5/5 in the bilateral upper and lower extremities.   Shoulder shrug is equal and symmetric.  There is no pronator drift. Deep tendon reflexes: Deep tendon reflexes are 2/4 at the bilateral biceps, triceps, brachioradialis, patella and achilles. Plantar responses are downgoing bilaterally.  Movement  examination: Tone: There is mild increased tone in the LUE and bilateral lower extremities Abnormal movements: there is LUE/LLE tremor that is felt with distraction procedures only Coordination:  There is  decremation with RAM's, with any form of RAMS, including alternating supination and pronation of the forearm, hand opening and closing, finger taps, heel taps and toe taps, L>R and UE>LE Gait and Station: The patient has no difficulty arising out of a deep-seated chair without the use of the hands. The patient's stride length is decreased with marked decreased arm swing on the L.  She drags the L leg I have reviewed and interpreted the following labs independently   Chemistry      Component Value Date/Time   NA 139 03/14/2021 0000   K 4.1 03/14/2021 0000   CL 104 03/14/2021 0000   CO2 27 03/14/2021 0000   BUN 14 03/14/2021 0000   CREATININE 0.81 03/14/2021 0000      Component Value Date/Time   CALCIUM 10.1 03/14/2021 0000   ALKPHOS 52 02/20/2017 0934   AST 13 03/14/2021 0000  ALT 12 03/14/2021 0000   BILITOT 0.5 03/14/2021 0000      Lab Results  Component Value Date   TSH 1.26 10/16/2020   Lab Results  Component Value Date   WBC 4.7 03/14/2021   HGB 12.3 03/14/2021   HCT 36.2 03/14/2021   MCV 89.8 03/14/2021   PLT 205 03/14/2021     Total time spent on today's visit was 60 minutes, including both face-to-face time and nonface-to-face time.  Time included that spent on review of records (prior notes available to me/labs/imaging if pertinent), discussing treatment and goals, answering patient's questions and coordinating care.  Cc:  Unk Pinto, MD

## 2021-03-15 NOTE — Progress Notes (Signed)
============================================================ -   Test results slightly outside the reference range are not unusual. If there is anything important, I will review this with you,  otherwise it is considered normal test values.  If you have further questions,  please do not hesitate to contact me at the office or via My Chart.  ============================================================ ============================================================  -  Liver Enzymes - All back to Normal - Wonderful  ! ============================================================ ============================================================  - Hepatitis A & Hepatitis C tests - Both Negative & OK    - Hepatitis B Antibody is Positive and apparently was Positive 7 years ago, So. . .   Either had the Infection or Vaccine many years ago ============================================================ ============================================================  - All Else - CBC - Kidneys - Electrolytes - all  Normal / OK ============================================================ ============================================================

## 2021-03-19 ENCOUNTER — Encounter: Payer: Self-pay | Admitting: Neurology

## 2021-03-19 ENCOUNTER — Other Ambulatory Visit: Payer: Self-pay

## 2021-03-19 ENCOUNTER — Ambulatory Visit: Payer: BC Managed Care – PPO | Admitting: Neurology

## 2021-03-19 VITALS — BP 134/68 | HR 70 | Ht 61.5 in | Wt 168.0 lb

## 2021-03-19 DIAGNOSIS — G20 Parkinson's disease: Secondary | ICD-10-CM | POA: Diagnosis not present

## 2021-03-19 NOTE — Patient Instructions (Addendum)
1.  Start Carbidopa Levodopa as follows:  Take 1/2 tablet three times daily, at least 30 minutes before meals (approximately 7am/11am/4pm), for one week  Then take 1/2 tablet in the morning, 1/2 tablet in the afternoon, 1 tablet in the evening, at least 30 minutes before meals, for one week  Then take 1/2 tablet in the morning, 1 tablet in the afternoon, 1 tablet in the evening, at least 30 minutes before meals, for one week  Then take 1 tablet three times daily at 7am/11am/4pm, at least 30 minutes before meals   As a reminder, carbidopa/levodopa can be taken at the same time as a carbohydrate, but we like to have you take your pill either 30 minutes before a protein source or 1 hour after as protein can interfere with carbidopa/levodopa absorption.  2.  You have been referred to [facility] for therapy. They will call you directly to schedule an appointment.  Please call [phone] if you do not hear from them.    Please contact Sandford Craze, LCSW  Her number is [phone].

## 2021-03-21 ENCOUNTER — Telehealth: Payer: Self-pay | Admitting: Neurology

## 2021-03-21 MED ORDER — CARBIDOPA-LEVODOPA 25-100 MG PO TABS: 1.0000 | ORAL_TABLET | Freq: Three times a day (TID) | ORAL | 2 refills | Status: DC

## 2021-03-21 NOTE — Telephone Encounter (Signed)
Patient called and said she was seen on 03/18/21 and prescribed a new medication by Dr. Carles Collet, not sure of name. She said the pharmacy does not have it.  [facility] in [place]

## 2021-03-21 NOTE — Telephone Encounter (Signed)
Spoke with patient who states the pharmacy does not have her rx.  Reviewed the chart and saw that the rx for carbidopa levodopa had not been sent over. Rx(s) sent to pharmacy electronically.  Contacted pharmacy to verify rx was received.   Left message on patients voicemail informing her that the rx had been received by the pharmacy and they will send a text when its ready for pick up. Advised a call back with any questions or concerns.

## 2021-04-18 DIAGNOSIS — H04123 Dry eye syndrome of bilateral lacrimal glands: Secondary | ICD-10-CM | POA: Diagnosis not present

## 2021-04-18 DIAGNOSIS — H2513 Age-related nuclear cataract, bilateral: Secondary | ICD-10-CM | POA: Diagnosis not present

## 2021-04-18 DIAGNOSIS — H35363 Drusen (degenerative) of macula, bilateral: Secondary | ICD-10-CM | POA: Diagnosis not present

## 2021-04-18 DIAGNOSIS — R7309 Other abnormal glucose: Secondary | ICD-10-CM | POA: Diagnosis not present

## 2021-04-18 LAB — HM DIABETES EYE EXAM

## 2021-04-27 ENCOUNTER — Other Ambulatory Visit: Payer: Self-pay | Admitting: Neurology

## 2021-04-27 ENCOUNTER — Other Ambulatory Visit: Payer: Self-pay | Admitting: Adult Health

## 2021-04-30 ENCOUNTER — Encounter: Payer: Self-pay | Admitting: *Deleted

## 2021-05-09 ENCOUNTER — Other Ambulatory Visit: Payer: Self-pay | Admitting: Adult Health

## 2021-05-09 MED ORDER — TERBINAFINE HCL 250 MG PO TABS: 250.0000 mg | ORAL_TABLET | Freq: Every day | ORAL | 0 refills | Status: DC

## 2021-05-09 NOTE — Progress Notes (Signed)
Per telephone conversation and photo via mychart, discussed doubt onychomycosis due to timeline but possible subungal fungal or bacterial infection. She has been doing regular epsom salt soaks without improvement, no purulent discharge, not significantly tender as would expect with paronychia. Will try oral antifungal med but advised if worse despite this or no improvement in 2-4 weeks schedule OV in office.

## 2021-05-24 ENCOUNTER — Other Ambulatory Visit: Payer: Self-pay | Admitting: Neurology

## 2021-06-07 ENCOUNTER — Other Ambulatory Visit: Payer: Self-pay | Admitting: Adult Health

## 2021-06-09 ENCOUNTER — Other Ambulatory Visit: Payer: Self-pay | Admitting: Neurology

## 2021-06-20 NOTE — Progress Notes (Signed)
Assessment/Plan:   1.  Parkinsons Disease, diagnosed March, 2022  -continue carbidopa/levodopa 25/100, 1 at 7am/11am/4pm  -We discussed that it used to be thought that levodopa would increase risk of melanoma but now it is believed that Parkinsons itself likely increases risk of melanoma. she is to get regular skin checks.  -she asked several questions and answered to the best of my ability    Subjective:   Jenna Miller was seen today in follow up for newly diagnosed Parkinsons disease.  My previous records were reviewed prior to todays visit as well as outside records available to me. Started levodopa last visit and patient reports that the medication is helpful and she has no SE with the medication.  Pt denies falls.  Pt denies lightheadedness, near syncope.  No hallucinations.  Mood has been good.  Work is going well.   Walking for exercise - started at 3,000 steps and worked up to 8,000 and now is doing, 10,000-12,000 steps per day.    Current prescribed movement disorder medications: Carbidopa/levodopa 25/100, 1 tablet 3 times per day (started last visit)   PREVIOUS MEDICATIONS: Sinemet  ALLERGIES:   Allergies  Allergen Reactions   Lipitor [Atorvastatin] Anaphylaxis and Swelling    Other reaction(s): Other (See Comments) Unknown   Effexor [Venlafaxine]     Other reaction(s): Other (See Comments)   Paxil [Paroxetine Hcl]     Other reaction(s): Other (See Comments)   Wellbutrin [Bupropion] Hives    CURRENT MEDICATIONS:  Outpatient Encounter Medications as of 06/24/2021  Medication Sig   carbidopa-levodopa (SINEMET IR) 25-100 MG tablet Take 1 tablet by mouth 3 (three) times daily.   Cholecalciferol (VITAMIN D3 PO) Take 5,000 mg by mouth daily.   ezetimibe (ZETIA) 10 MG tablet Take  1 tablet  Daily  for Cholesterol   famotidine (PEPCID) 40 MG tablet TAKE 1 TABLET 2 X /DAY FOR ACID INDIGESTION & REFLUX   fluticasone (FLONASE) 50 MCG/ACT nasal spray SPRAY 2 SPRAYS  INTO EACH NOSTRIL EVERY DAY   linaclotide (LINZESS) 145 MCG CAPS capsule Take 145 mcg by mouth daily before breakfast.   Methylcobalamin (B12-ACTIVE) 1 MG CHEW Chew 1 each by mouth daily.   terbinafine (LAMISIL) 250 MG tablet Take  1 tablet  Daily  for Fungal Infection   [DISCONTINUED] tiZANidine (ZANAFLEX) 4 MG tablet TAKE 1 TABLET 3 X /DAY AS NEEDED FOR MUSCLE SPASM   No facility-administered encounter medications on file as of 06/24/2021.    Objective:   PHYSICAL EXAMINATION:    VITALS:   Vitals:   06/24/21 0818  BP: 124/72  Pulse: 79  SpO2: 98%  Weight: 171 lb (77.6 kg)  Height: 5\' 1"  (1.549 m)    GEN:  The patient appears stated age and is in NAD. HEENT:  Normocephalic, atraumatic.  The mucous membranes are moist. The superficial temporal arteries are without ropiness or tenderness. CV:  RRR Lungs:  CTAB Neck/HEME:  There are no carotid bruits bilaterally.  Neurological examination:  Orientation: The patient is alert and oriented x3. Cranial nerves: There is good facial symmetry with facial hypomimia. The speech is fluent and clear. Soft palate rises symmetrically and there is no tongue deviation. Hearing is intact to conversational tone. Sensation: Sensation is intact to light touch throughout Motor: Strength is at least antigravity x4.  Movement examination: Tone: There is nl tone in the UE/LE Abnormal movements:no tremor Coordination:  There is min decremation with RAM's, with any form of RAMS, including alternating supination and  pronation of the forearm, hand opening and closing, finger taps, heel taps and toe taps, only in the L foot today Gait and Station: The patient has no difficulty arising out of a deep-seated chair without the use of the hands. The patient's stride length is good with decreased arm swing on the L  I have reviewed and interpreted the following labs independently    Chemistry      Component Value Date/Time   NA 139 03/14/2021 0000   K 4.1  03/14/2021 0000   CL 104 03/14/2021 0000   CO2 27 03/14/2021 0000   BUN 14 03/14/2021 0000   CREATININE 0.81 03/14/2021 0000      Component Value Date/Time   CALCIUM 10.1 03/14/2021 0000   ALKPHOS 52 02/20/2017 0934   AST 13 03/14/2021 0000   ALT 12 03/14/2021 0000   BILITOT 0.5 03/14/2021 0000       Lab Results  Component Value Date   WBC 4.7 03/14/2021   HGB 12.3 03/14/2021   HCT 36.2 03/14/2021   MCV 89.8 03/14/2021   PLT 205 03/14/2021    Lab Results  Component Value Date   TSH 1.26 10/16/2020     Total time spent on today's visit was 20 minutes, including both face-to-face time and nonface-to-face time.  Time included that spent on review of records (prior notes available to me/labs/imaging if pertinent), discussing treatment and goals, answering patient's questions and coordinating care.  Cc:  Unk Pinto, MD

## 2021-06-24 ENCOUNTER — Ambulatory Visit: Payer: BC Managed Care – PPO | Admitting: Neurology

## 2021-06-24 ENCOUNTER — Other Ambulatory Visit: Payer: Self-pay

## 2021-06-24 ENCOUNTER — Encounter: Payer: Self-pay | Admitting: Neurology

## 2021-06-24 VITALS — BP 124/72 | HR 79 | Ht 61.0 in | Wt 171.0 lb

## 2021-06-24 DIAGNOSIS — G20 Parkinson's disease: Secondary | ICD-10-CM

## 2021-06-24 MED ORDER — CARBIDOPA-LEVODOPA 25-100 MG PO TABS: 1.0000 | ORAL_TABLET | Freq: Three times a day (TID) | ORAL | 2 refills | Status: DC

## 2021-06-24 NOTE — Patient Instructions (Signed)
Online Resources for Power over Parkinson's Group   [employer] Online Groups  Power over Parkinson's Group :    Upcoming Power over Parkinson's Meetings:  2nd Wednesdays of the month at 2 pm:  June 8th, July 13th Maiden at [email or web address] if interested in participating in this group Parkinson's Care Partners Group:    3rd Mondays, Contact Misty Paladino Atypical Parkinsonian Patient Group:   4th Wednesdays, Contact Misty Paladino If you are interested in participating in these groups with Misty, please contact her directly for how to join those meetings.  Her contact information is [email or web address].   [place]:  [email or web address] PD Health at Home continues:  Mindfulness Mondays, Expert Briefing Tuesdays, Wellness Wednesdays, Take Time Thursdays, Fitness Fridays Register for expert briefings (webinars) at [email or web address]  Please check out their website to sign up for emails and see their full online offerings  [place]:  [email or web address]  Check out additional information on their website to see their full online offerings  [facility]:  [email or web address] Upcoming Webinar:  Stay tuned Care Partner Monthly Meetup.  With [facility] [place].  First Tuesday of each month, 2 pm Joy Breaks:  First Wednesday of each month, 2-3 pm. There will be art, doodling, making, crafting, listening, laughing, stories, and everything in between. No art experience necessary. No supplies required. Just show up for joy!  Register on their website. Check out additional information to Live Well Today on their website  Parkinson and Movement Disorders (PMD) Alliance:  www.[email or web address].org [employer] Online:  Online Education Events Sign up for emails, which are sent weekly to give you updates on programming and online offerings     Parkinson's Association of the [place]:   www.[email or web address] Information on online support groups, education events, and online exercises including Yoga, Parkinson's exercises and more-LOTS of information on links to PD resources and online events Virtual Support Group through Parkinson's Association of the [place]; next one is scheduled for Wednesday, May 4th, 2022 at 2 pm. (These are typically scheduled for the 1st Wednesday of the month at 2 pm).  Visit website for details.  Additional links for movement activities: PWR! Moves Classes at [place] RESUMED!  Wednesdays 10 and 11 am.  Contact Amy Marriott, PT [email or web address] or [phone] if interested Here is a link to the PWR!Moves classes on Zoom from [phone] - Daily Mon-Sat at 10:00. Via Zoom, FREE and open to all.  There is also a link below via Facebook if you use that platform. [place] [email or web address] Parkinson's Wellness Recovery (PWR! Moves)  www.[email or web address] Info on the PWR! Virtual Experience:  You will have access to our expertise through self-assessment, guided plans that start with the PD-specific fundamentals, educational content, tips, Q&A with an expert, and a growing PrepaidParty.no of PD-specific pre-recorded and live exercise classes of varying types and intensity - both physical and cognitive! If that is not enough, we offer 1:1 wellness consultations (in-person or virtual) to personalize your PWR! Art therapist.  Check out the PWR! Move of the month on the [place] [facility] website:  [address] [email or web address] Fridays:  As part of the PD Health @ Home program, this free video series focuses each week on one aspect of fitness designed to support  people living with Parkinson's.  These weekly videos highlight the [place] recent fitness guidelines for people with Parkinson's disease.  [address] Dance for PD website is offering free, live-stream classes throughout the week, as well as links to  Arts administrator of classes:  [email or web address] Dance for Parkinson's Class:  [place].  Free offering for people with Parkinson's and care partners; virtual class.  For more information, contact [phone] or email Ruffin Frederick at [email or web address] Virtual dance and Pilates for Parkinson's classes: Click on the Community Tab> Parkinson's Movement Initiative Tab.  To register for classes and for more information, visit [email or web address] and click the "community" tab.     [employer] Parkinson's Cycling Classes  [employer]: 1pm on Fridays-Live classes at [employer] ([employer] at [email or web address] or [phone]) [employer]: Virtual Classes Mondays and Thursdays Jeanette Caprice classes Tuesday, Wednesday and Thursday (contact Lucas at [email or web address]  or [phone])  [facility] Boxing Three levels of classes are offered Tuesdays and Thursdays:  10:30 am,  12 noon & 1:45 pm at [facility].  Active Stretching with Paula Compton Class starting in March, on Fridays To observe a class or for  more information, call [phone] or email [email or web address] [employer]: Online Caregiver Education Opportunities:  www.well-[email or web address]/caregiver-education/caregiver-support-group.  You may also contact Vickki Muff at [email or web address] or [phone].   Well[employer]Spring Navigator:  [phone] program, a free service to help individuals and families through the journey of determining care for older adults.  The "Navigator" is a  [employer], Education officer, museum, who will speak with a prospective client and/or loved ones to provide an assessment of the situation and a set of recommendations for a personalized care plan -- all free of charge, and whether Well[employer]Spring [employer] offers the needed service or not. If the need is not a service we provide, we are well-connected with reputable programs in town that we can refer you to.  www.well-[email or web address] or to speak with the Navigator, call [phone].

## 2021-10-02 ENCOUNTER — Other Ambulatory Visit: Payer: Self-pay | Admitting: Internal Medicine

## 2021-10-02 DIAGNOSIS — Z1231 Encounter for screening mammogram for malignant neoplasm of breast: Secondary | ICD-10-CM

## 2021-10-06 ENCOUNTER — Other Ambulatory Visit: Payer: Self-pay | Admitting: Adult Health

## 2021-10-16 ENCOUNTER — Encounter: Payer: BC Managed Care – PPO | Admitting: Adult Health Nurse Practitioner

## 2021-10-22 NOTE — Progress Notes (Signed)
Patient ID: Jenna Miller, female   DOB: 10/04/1956, 65 y.o.   MRN: [id number]  COMPLETE PHYSICAL  Assessment and Plan:    Maleiya was seen today for annual exam.  Diagnoses and all orders for this visit:  Encounter for general adult medical examination with abnormal findings Due Annually  Essential hypertension Continue current medications: Monitor blood pressure at home; call if consistently over 130/80 Continue DASH diet.   Reminder to go to the ER if any CP, SOB, nausea, dizziness, severe HA, changes vision/speech, left arm numbness and tingling and jaw pain. -     CBC with Differential/Platelet -     COMPLETE METABOLIC PANEL WITH GFR -     Magnesium -     EKG 12-Lead  Hyperlipidemia Lifestyle diet controlled Discussed dietary and exercise modifications Low fat diet -     Lipid panel  Abnormal glucose Discussed dietary and exercise modifications -     Hemoglobin A1c  Vitamin D deficiency -     VITAMIN D 25 Hydroxy (Vit-D Deficiency, Fractures)  Morbid Obesity (BMI 32.54)  Reviewed diet, exercise and weight control- educational material given to patient General weight loss/lifestyle modification strategies discussed (elicit support from others; identify saboteurs; non-food rewards, etc). Continue food diary Continue restricted calorie diet Continue daily exercise as well as behavior modification such as walking further away, putting down the fork, having a plan, using stairs, etc.    Gastroesophageal reflux disease, unspecified whether esophagitis present Doing well at this time Continue: famotidine 40mg  daily Diet discussed Monitor for triggers Avoid food with high acid content Avoid excessive cafeine Increase water intake  Fatty liver Weight loss advised, avoid alcohol/tylenol, will monitor LFTs  CKD Stage 2 (GFR 69 ml/min) Increase fluids  Avoid NSAIDS Blood pressure control Monitor sugars  Will continue to monitor  Malignant neoplasm of left  female breast, unspecified estrogen receptor status, unspecified site of breast (HCC) Monitored  Irritable bowel syndrome with constipation If not on benefiber then add it, decrease stress,  if any worsening symptoms, blood in stool, AB pain, etc call office  Medication management Continued  Acute pain of left shoulder Following with orthopedics Reports needing MRI  Vestibular Migraines Follows with ENT for this Recommended topamax daily Patient not taking at this time Symptoms returning Discussed follow up with ENT  Screening for thyroid disorder -     TSH  Parkinson's Disease Continue to follow with Dr. Carles Collet Continue Sinemet [phone]  Screening for blood or protein in urine -     Urinalysis w microscopic + reflex cultur -     Microalbumin / creatinine urine ratio  Screening for ischemic heart disease EKG  Flu vaccine need -     Flu vaccine HIGH DOSE PF  Need for pneumococcal vaccine  Prevnar 20 given  Vitamin D deficiency -     VITAMIN D 25 Hydroxy (Vit-D Deficiency, Fractures) Continue Vit D supplementation  Malignant neoplasm of left female breast, unspecified estrogen receptor status, unspecified site of breast [facility]) monitored  Future Appointments  Date Time Provider [place]  10/30/2021  7:00 AM GI-BCG MM 2 GI-BCGMM GI-BREAST CE  01/09/2022  8:15 AM Tat, Eustace Quail, DO LBN-LBNG None  10/24/2022  9:00 AM Magda Bernheim, NP GAAM-GAAIM None     Continue diet and meds as discussed. Further disposition pending results of labs.  HPI 65 y.o. female  presents for CPE and follow up on HTN, HLD, GERD, abnormal glucose / prediabetes, IBS-C, CKDII, weight and vitamin D.  She was diagnosed with Parkinson's in 02/2021 with Dr. Carles Collet. On Sinemet IR 1 tab TID. Symptoms are currently controlled. Tremor is controlled.   Her blood pressure has been controlled at home, today their BP is BP: 128/76.   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   She denies chest pain, shortness of breath, dizziness.  BMI is Body mass index is 33.22 kg/m., she is working on diet and exercise. Wt Readings from Last 3 Encounters:  10/24/21 175 lb 12.8 oz (79.7 kg)  06/24/21 171 lb (77.6 kg)  03/19/21 168 lb (76.2 kg)   She has hypertensive CKD stage 2, she is on HCTZ. Lab Results  Component Value Date   GFRNONAA 76 03/14/2021   She has IBS-C is on linzess, states the 75 was working, then she moved to the 145 and now she is still having round pellets.    She is on cholesterol medication, zetia and denies myalgias. Her cholesterol is at goal. The cholesterol last visit was:   Lab Results  Component Value Date   CHOL 208 (H) 10/16/2020   HDL 67 10/16/2020   LDLCALC 123 (H) 10/16/2020   TRIG 82 10/16/2020   CHOLHDL 3.1 10/16/2020    She has been working on diet and exercise for prediabetes, and denies foot ulcerations, hyperglycemia, hypoglycemia , increased appetite, nausea, paresthesia of the feet, polydipsia, polyuria, visual disturbances, vomiting and weight loss. Last A1C in the office was:  Lab Results  Component Value Date   HGBA1C 5.7 (H) 10/16/2020   Patient is on Vitamin D supplement, 8000 a day.   Lab Results  Component Value Date   VD25OH 97 10/16/2020    Current Medications:  Current Outpatient Medications on File Prior to Visit  Medication Sig Dispense Refill   carbidopa-levodopa (SINEMET IR) 25-100 MG tablet Take 1 tablet by mouth 3 (three) times daily. 270 tablet 2   Cholecalciferol (VITAMIN D3 PO) Take 5,000 mg by mouth daily.     ezetimibe (ZETIA) 10 MG tablet Take  1 tablet  Daily  for Cholesterol 90 tablet 3   famotidine (PEPCID) 40 MG tablet Take  1 tablet  2 x /day  to Prevent Heartburn & Indigestion 180 tablet 3   fluticasone (FLONASE) 50 MCG/ACT  nasal spray SPRAY 2 SPRAYS INTO EACH NOSTRIL EVERY DAY 48 mL 1   linaclotide (LINZESS) 145 MCG CAPS capsule Take 145 mcg by mouth daily before breakfast.     Methylcobalamin (B12-ACTIVE) 1 MG CHEW Chew 1 each by mouth daily.     terbinafine (LAMISIL) 250 MG tablet Take  1 tablet  Daily  for Fungal Infection (Patient not taking: Reported on 10/24/2021) 30 tablet 1   No current facility-administered medications on file prior to visit.    Medical History:  Past Medical History:  Diagnosis Date   Acid reflux    Arthritis    Cancer ([place])    Left Breast   Complication of anesthesia    woke  up X 1   Fatty liver    High cholesterol    Hypertension    Spider bite 2016   Vitamin D deficiency    Immunization History  Administered Date(s) Administered   Influenza Inj Mdck Quad Pf 11/12/2019   Influenza Inj Mdck  Quad With Preservative 10/06/2018, 10/13/2019   Influenza Split 08/29/2013, 10/16/2015   Influenza, High Dose Seasonal PF 09/29/2017, 10/16/2020   Influenza, Seasonal, Injecte, Preservative Fre 11/02/2014   Influenza,inj,quad, With Preservative 09/24/2016   PFIZER(Purple Top)SARS-COV-2 Vaccination 03/10/2020, 03/31/2020   PPD Test 03/14/2014, 07/11/2015   Pneumococcal Polysaccharide-23 05/22/2003   Td 05/28/2005   Tdap 07/11/2015   Zoster, Live 02/26/2016   Flu : 10/24/21 Tetanus: 2016 Pneumovax: 2004 Prevnar 20: 10/24/21 Flu vaccine: 2019 Zostavax: 2017  MGM: [place] 10/30/2021 is next Colonoscopy: 2017 due this year, pt will schedule CT AB 2014 CXR 2018 DEXA 2007 PAP 2021- [facility]  Patient Care Team: Unk Pinto, MD as PCP - General (Internal Medicine) Unk Pinto, MD as Referring Physician (Internal Medicine)  Allergies Allergies  Allergen Reactions   Lipitor [Atorvastatin] Anaphylaxis and Swelling    Other reaction(s): Other (See Comments) Unknown   Effexor [Venlafaxine]     Other reaction(s): Other (See Comments)   Paxil  [Paroxetine Hcl]     Other reaction(s): Other (See Comments)   Wellbutrin [Bupropion] Hives    SURGICAL HISTORY She  has a past surgical history that includes TRAM (1999-2000); Knee arthroscopy (1982); Wisdom tooth extraction; Cholecystectomy (11/19/2012); Breast surgery (1995); Hernia repair; and Mastectomy (Left, 1995). FAMILY HISTORY Her family history includes COPD in her sister; Cancer in her cousin; Gout (age of onset: 46) in her father; Hyperlipidemia in her brother and mother; Hypertension in her brother and mother. SOCIAL HISTORY She  reports that she has never smoked. She has never used smokeless tobacco. She reports current alcohol use of about 1.0 standard drink per week. She reports that she does not use drugs.  Review of Systems:  Review of Systems  Constitutional:  Negative for chills, fever and malaise/fatigue.  HENT:  Negative for congestion, ear pain, hearing loss, sinus pain, sore throat and tinnitus.   Eyes:  Negative for blurred vision and double vision.  Respiratory:  Negative for cough, hemoptysis, sputum production, shortness of breath and wheezing.   Cardiovascular:  Negative for chest pain, palpitations and leg swelling.  Gastrointestinal:  Positive for constipation. Negative for abdominal pain, blood in stool, diarrhea, heartburn, melena, nausea and vomiting.  Genitourinary: Negative.  Negative for dysuria and urgency.  Musculoskeletal:  Negative for back pain, falls, joint pain, myalgias and neck pain.  Skin:  Negative for rash.  Neurological:  Positive for headaches (irregular doesnt take medication). Negative for dizziness, tingling, tremors, sensory change, loss of consciousness and weakness.  Endo/Heme/Allergies:  Does not bruise/bleed easily.  Psychiatric/Behavioral:  Negative for depression and suicidal ideas. The patient has insomnia (difficulty staying asleep). The patient is not nervous/anxious.    Physical Exam: BP 128/76   Pulse 75   Temp 97.6 F  (36.4 C)   Resp 16   Ht 5\' 1"  (1.549 m)   Wt 175 lb 12.8 oz (79.7 kg)   LMP 06/10/2010   SpO2 99%   BMI 33.22 kg/m  Wt Readings from Last 3 Encounters:  10/24/21 175 lb 12.8 oz (79.7 kg)  06/24/21 171 lb (77.6 kg)  03/19/21 168 lb (76.2 kg)    General Appearance: Well nourished well developed, in no apparent distress. Eyes: PERRLA, EOMs, conjunctiva no swelling or erythema ENT/Mouth: Ear canals normal without obstruction, swelling, erythma, discharge.  TMs normal bilaterally.  Oropharynx moist, clear, without exudate, or postoropharyngeal swelling. Neck: Supple, thyroid normal,no cervical adenopathy  Respiratory: Respiratory effort normal, Breath sounds clear A&P without rhonchi, wheeze, or rale.  No  retractions, no accessory usage. Cardio: RRR with no MRGs. Brisk peripheral pulses without edema.  Abdomen: Soft, + BS,  Non tender, no guarding, rebound, hernias, masses. Musculoskeletal: Full ROM, 5/5 strength, Normal gait. LUE tremor.  Skin: Warm, dry without rashes, lesions, ecchymosis.  Neuro: Awake and oriented X 3, Cranial nerves intact. Normal muscle tone, no cerebellar symptoms. Psych: Normal affect, Insight and Judgment appropriate.   EKG: NSR  [facility]  10/24/2021

## 2021-10-24 ENCOUNTER — Ambulatory Visit (INDEPENDENT_AMBULATORY_CARE_PROVIDER_SITE_OTHER): Payer: BC Managed Care – PPO | Admitting: Nurse Practitioner

## 2021-10-24 ENCOUNTER — Encounter: Payer: Self-pay | Admitting: Nurse Practitioner

## 2021-10-24 ENCOUNTER — Other Ambulatory Visit: Payer: Self-pay

## 2021-10-24 VITALS — BP 128/76 | HR 75 | Temp 97.6°F | Resp 16 | Ht 61.0 in | Wt 175.8 lb

## 2021-10-24 DIAGNOSIS — I10 Essential (primary) hypertension: Secondary | ICD-10-CM | POA: Diagnosis not present

## 2021-10-24 DIAGNOSIS — Z1329 Encounter for screening for other suspected endocrine disorder: Secondary | ICD-10-CM

## 2021-10-24 DIAGNOSIS — Z23 Encounter for immunization: Secondary | ICD-10-CM | POA: Diagnosis not present

## 2021-10-24 DIAGNOSIS — Z0000 Encounter for general adult medical examination without abnormal findings: Secondary | ICD-10-CM

## 2021-10-24 DIAGNOSIS — R7309 Other abnormal glucose: Secondary | ICD-10-CM

## 2021-10-24 DIAGNOSIS — Z79899 Other long term (current) drug therapy: Secondary | ICD-10-CM | POA: Diagnosis not present

## 2021-10-24 DIAGNOSIS — G20 Parkinson's disease: Secondary | ICD-10-CM

## 2021-10-24 DIAGNOSIS — Z1321 Encounter for screening for nutritional disorder: Secondary | ICD-10-CM

## 2021-10-24 DIAGNOSIS — M25512 Pain in left shoulder: Secondary | ICD-10-CM

## 2021-10-24 DIAGNOSIS — Z13220 Encounter for screening for lipoid disorders: Secondary | ICD-10-CM | POA: Diagnosis not present

## 2021-10-24 DIAGNOSIS — Z1389 Encounter for screening for other disorder: Secondary | ICD-10-CM

## 2021-10-24 DIAGNOSIS — E559 Vitamin D deficiency, unspecified: Secondary | ICD-10-CM | POA: Diagnosis not present

## 2021-10-24 DIAGNOSIS — E782 Mixed hyperlipidemia: Secondary | ICD-10-CM

## 2021-10-24 DIAGNOSIS — K581 Irritable bowel syndrome with constipation: Secondary | ICD-10-CM

## 2021-10-24 DIAGNOSIS — Z0001 Encounter for general adult medical examination with abnormal findings: Secondary | ICD-10-CM

## 2021-10-24 DIAGNOSIS — Z131 Encounter for screening for diabetes mellitus: Secondary | ICD-10-CM | POA: Diagnosis not present

## 2021-10-24 DIAGNOSIS — G43809 Other migraine, not intractable, without status migrainosus: Secondary | ICD-10-CM

## 2021-10-24 DIAGNOSIS — K219 Gastro-esophageal reflux disease without esophagitis: Secondary | ICD-10-CM

## 2021-10-24 DIAGNOSIS — K760 Fatty (change of) liver, not elsewhere classified: Secondary | ICD-10-CM

## 2021-10-24 DIAGNOSIS — Z136 Encounter for screening for cardiovascular disorders: Secondary | ICD-10-CM | POA: Diagnosis not present

## 2021-10-24 DIAGNOSIS — C50912 Malignant neoplasm of unspecified site of left female breast: Secondary | ICD-10-CM

## 2021-10-24 DIAGNOSIS — N182 Chronic kidney disease, stage 2 (mild): Secondary | ICD-10-CM

## 2021-10-24 NOTE — Patient Instructions (Signed)

## 2021-10-25 LAB — CBC WITH DIFFERENTIAL/PLATELET
Absolute Monocytes: 482 cells/uL (ref 200–950)
Basophils Absolute: 40 cells/uL (ref 0–200)
Basophils Relative: 0.6 %
Eosinophils Absolute: 53 cells/uL (ref 15–500)
Eosinophils Relative: 0.8 %
HCT: 38.5 % (ref 35.0–45.0)
Hemoglobin: 12.8 g/dL (ref 11.7–15.5)
Lymphs Abs: 1399 cells/uL (ref 850–3900)
MCH: 30.2 pg (ref 27.0–33.0)
MCHC: 33.2 g/dL (ref 32.0–36.0)
MCV: 90.8 fL (ref 80.0–100.0)
MPV: 10.9 fL (ref 7.5–12.5)
Monocytes Relative: 7.3 %
Neutro Abs: 4627 cells/uL (ref 1500–7800)
Neutrophils Relative %: 70.1 %
Platelets: 216 10*3/uL (ref 140–400)
RBC: 4.24 10*6/uL (ref 3.80–5.10)
RDW: 12.4 % (ref 11.0–15.0)
Total Lymphocyte: 21.2 %
WBC: 6.6 10*3/uL (ref 3.8–10.8)

## 2021-10-25 LAB — HEMOGLOBIN A1C
Hgb A1c MFr Bld: 5.6 % of total Hgb (ref <5.7)
Mean Plasma Glucose: 114 mg/dL
eAG (mmol/L): 6.3 mmol/L

## 2021-10-25 LAB — MICROALBUMIN / CREATININE URINE RATIO
Creatinine, Urine: 46 mg/dL (ref 20–275)
Microalb Creat Ratio: 9 mcg/mg creat (ref <30)
Microalb, Ur: 0.4 mg/dL

## 2021-10-25 LAB — URINALYSIS, ROUTINE W REFLEX MICROSCOPIC
Bacteria, UA: NONE SEEN /HPF
Bilirubin Urine: NEGATIVE
Glucose, UA: NEGATIVE
Hgb urine dipstick: NEGATIVE
Hyaline Cast: NONE SEEN /LPF
Ketones, ur: NEGATIVE
Nitrite: NEGATIVE
Protein, ur: NEGATIVE
RBC / HPF: NONE SEEN /HPF (ref 0–2)
Specific Gravity, Urine: 1.007 (ref 1.001–1.035)
Squamous Epithelial / HPF: NONE SEEN /HPF (ref <=5)
pH: 6 (ref 5.0–8.0)

## 2021-10-25 LAB — COMPLETE METABOLIC PANEL WITH GFR
AG Ratio: 1.8 (calc) (ref 1.0–2.5)
ALT: 11 U/L (ref 6–29)
AST: 16 U/L (ref 10–35)
Albumin: 4.4 g/dL (ref 3.6–5.1)
Alkaline phosphatase (APISO): 52 U/L (ref 37–153)
BUN: 13 mg/dL (ref 7–25)
CO2: 28 mmol/L (ref 20–32)
Calcium: 9.7 mg/dL (ref 8.6–10.4)
Chloride: 103 mmol/L (ref 98–110)
Creat: 0.83 mg/dL (ref 0.50–1.05)
Globulin: 2.4 g/dL (calc) (ref 1.9–3.7)
Glucose, Bld: 95 mg/dL (ref 65–99)
Potassium: 4.5 mmol/L (ref 3.5–5.3)
Sodium: 139 mmol/L (ref 135–146)
Total Bilirubin: 0.4 mg/dL (ref 0.2–1.2)
Total Protein: 6.8 g/dL (ref 6.1–8.1)
eGFR: 78 mL/min/{1.73_m2} (ref >=60)

## 2021-10-25 LAB — LIPID PANEL
Cholesterol: 219 mg/dL — ABNORMAL HIGH (ref <200)
HDL: 68 mg/dL (ref >=50)
LDL Cholesterol (Calc): 131 mg/dL (calc) — ABNORMAL HIGH
Non-HDL Cholesterol (Calc): 151 mg/dL (calc) — ABNORMAL HIGH (ref <130)
Total CHOL/HDL Ratio: 3.2 (calc) (ref <5.0)
Triglycerides: 97 mg/dL (ref <150)

## 2021-10-25 LAB — MICROSCOPIC MESSAGE

## 2021-10-25 LAB — TSH: TSH: 0.8 mIU/L (ref 0.40–4.50)

## 2021-10-25 LAB — MAGNESIUM: Magnesium: 2 mg/dL (ref 1.5–2.5)

## 2021-10-25 LAB — VITAMIN D 25 HYDROXY (VIT D DEFICIENCY, FRACTURES): Vit D, 25-Hydroxy: 86 ng/mL (ref 30–100)

## 2021-10-30 ENCOUNTER — Other Ambulatory Visit: Payer: Self-pay

## 2021-10-30 ENCOUNTER — Ambulatory Visit
Admission: RE | Admit: 2021-10-30 | Discharge: 2021-10-30 | Disposition: A | Discharge status: Home / Self Care | Payer: BC Managed Care – PPO | Source: Ambulatory Visit | Attending: Internal Medicine | Admitting: Internal Medicine

## 2021-10-30 DIAGNOSIS — Z1231 Encounter for screening mammogram for malignant neoplasm of breast: Secondary | ICD-10-CM

## 2022-01-07 NOTE — Progress Notes (Signed)
Assessment/Plan:   1.  Parkinsons Disease, diagnosed March, 2022  -continue carbidopa/levodopa 25/100, 1 at 7am/11am/4pm  -We discussed that it used to be thought that levodopa would increase risk of melanoma but now it is believed that Parkinsons itself likely increases risk of melanoma. she is to get regular skin checks.     Subjective:   Jenna Miller was seen today in follow up for newly diagnosed Parkinsons disease.  My previous records were reviewed prior to todays visit as well as outside records available to me.  She is noting some constipation.  She is drinking lots of water (over 80 oz per day).  No falls.  No lightheadedness or near syncope.  She is walking for exercise.  She is working full time.    Current prescribed movement disorder medications: Carbidopa/levodopa 25/100, 1 tablet 3 times per day    PREVIOUS MEDICATIONS: Sinemet  ALLERGIES:   Allergies  Allergen Reactions   Lipitor [Atorvastatin] Anaphylaxis and Swelling    Other reaction(s): Other (See Comments) Unknown   Effexor [Venlafaxine]     Other reaction(s): Other (See Comments)   Paxil [Paroxetine Hcl]     Other reaction(s): Other (See Comments)   Wellbutrin [Bupropion] Hives    CURRENT MEDICATIONS:  Outpatient Encounter Medications as of 01/09/2022  Medication Sig   BIOTIN 5000 PO Take by mouth.   carbidopa-levodopa (SINEMET IR) 25-100 MG tablet Take 1 tablet by mouth 3 (three) times daily.   Cholecalciferol (VITAMIN D3 PO) Take 5,000 mg by mouth daily.   ezetimibe (ZETIA) 10 MG tablet Take  1 tablet  Daily  for Cholesterol   famotidine (PEPCID) 40 MG tablet Take  1 tablet  2 x /day  to Prevent Heartburn & Indigestion   fluticasone (FLONASE) 50 MCG/ACT nasal spray SPRAY 2 SPRAYS INTO EACH NOSTRIL EVERY DAY   Methylcobalamin (B12-ACTIVE) 1 MG CHEW Chew 1 each by mouth daily.   Zinc Sulfate (ZINC 15 PO) Take by mouth.   [DISCONTINUED] linaclotide (LINZESS) 145 MCG CAPS capsule Take 145 mcg  by mouth daily before breakfast.   [DISCONTINUED] terbinafine (LAMISIL) 250 MG tablet Take  1 tablet  Daily  for Fungal Infection (Patient not taking: Reported on 10/24/2021)   No facility-administered encounter medications on file as of 01/09/2022.    Objective:   PHYSICAL EXAMINATION:    VITALS:   Vitals:   01/09/22 0740  BP: 118/60  Pulse: 73  SpO2: 98%  Weight: 177 lb 9.6 oz (80.6 kg)  Height: 5\' 2"  (1.575 m)     GEN:  The patient appears stated age and is in NAD. HEENT:  Normocephalic, atraumatic.  The mucous membranes are moist. The superficial temporal arteries are without ropiness or tenderness. CV:  RRR Lungs:  CTAB Neck/HEME:  There are no carotid bruits bilaterally.  Neurological examination:  Orientation: The patient is alert and oriented x3. Cranial nerves: There is good facial symmetry with facial hypomimia. The speech is fluent and clear. Soft palate rises symmetrically and there is no tongue deviation. Hearing is intact to conversational tone. Sensation: Sensation is intact to light touch throughout Motor: Strength is at least antigravity x4.  Movement examination: Tone: There is nl tone in the UE/LE Abnormal movements:no tremor Coordination:  There is mild decremation with RAM's, with any form of RAMS, including alternating supination and pronation of the forearm, hand opening and closing, finger taps, heel taps and toe taps, on the L Gait and Station: The patient has no difficulty arising out  of a deep-seated chair without the use of the hands. The patient's stride length is good with purposeful arm swing bilaterally  I have reviewed and interpreted the following labs independently    Chemistry      Component Value Date/Time   NA 139 10/24/2021 0940   K 4.5 10/24/2021 0940   CL 103 10/24/2021 0940   CO2 28 10/24/2021 0940   BUN 13 10/24/2021 0940   CREATININE 0.83 10/24/2021 0940      Component Value Date/Time   CALCIUM 9.7 10/24/2021 0940    ALKPHOS 52 02/20/2017 0934   AST 16 10/24/2021 0940   ALT 11 10/24/2021 0940   BILITOT 0.4 10/24/2021 0940       Lab Results  Component Value Date   WBC 6.6 10/24/2021   HGB 12.8 10/24/2021   HCT 38.5 10/24/2021   MCV 90.8 10/24/2021   PLT 216 10/24/2021    Lab Results  Component Value Date   TSH 0.80 10/24/2021      Cc:  Unk Pinto, MD

## 2022-01-09 ENCOUNTER — Ambulatory Visit: Payer: BC Managed Care – PPO | Admitting: Neurology

## 2022-01-09 ENCOUNTER — Other Ambulatory Visit: Payer: Self-pay

## 2022-01-09 ENCOUNTER — Encounter: Payer: Self-pay | Admitting: Neurology

## 2022-01-09 VITALS — BP 118/60 | HR 73 | Ht 62.0 in | Wt 177.6 lb

## 2022-01-09 DIAGNOSIS — G20 Parkinson's disease: Secondary | ICD-10-CM

## 2022-01-09 MED ORDER — CARBIDOPA-LEVODOPA 25-100 MG PO TABS: 1.0000 | ORAL_TABLET | Freq: Three times a day (TID) | ORAL | 1 refills | Status: DC

## 2022-01-09 NOTE — Patient Instructions (Signed)
Good to see you today!!  You look good!  As we discussed, it used to be thought that levodopa would increase risk of melanoma but now it is believed that Parkinsons itself likely increases risk of melanoma. I recommend yearly skin checks with a board certified dermatologist.  You can call [facility] or [facility] for an appointment.  [facility] Address: [address] [place], [place], [place] [address] Phone: [phone]  [facility] [place], [place], [place] Phone: [phone]

## 2022-01-28 ENCOUNTER — Encounter: Payer: Self-pay | Admitting: Internal Medicine

## 2022-03-03 ENCOUNTER — Other Ambulatory Visit: Payer: Self-pay | Admitting: Internal Medicine

## 2022-03-15 IMAGING — MG DIGITAL SCREENING UNILAT RIGHT W/ TOMO W/ CAD
4 series · 4 of 12 positions shown · non-contrast
Comparison: Previous exam(s).

CLINICAL DATA: Screening.

EXAM:
DIGITAL SCREENING UNILATERAL RIGHT MAMMOGRAM WITH CAD AND TOMO

[R CC synth-2D]
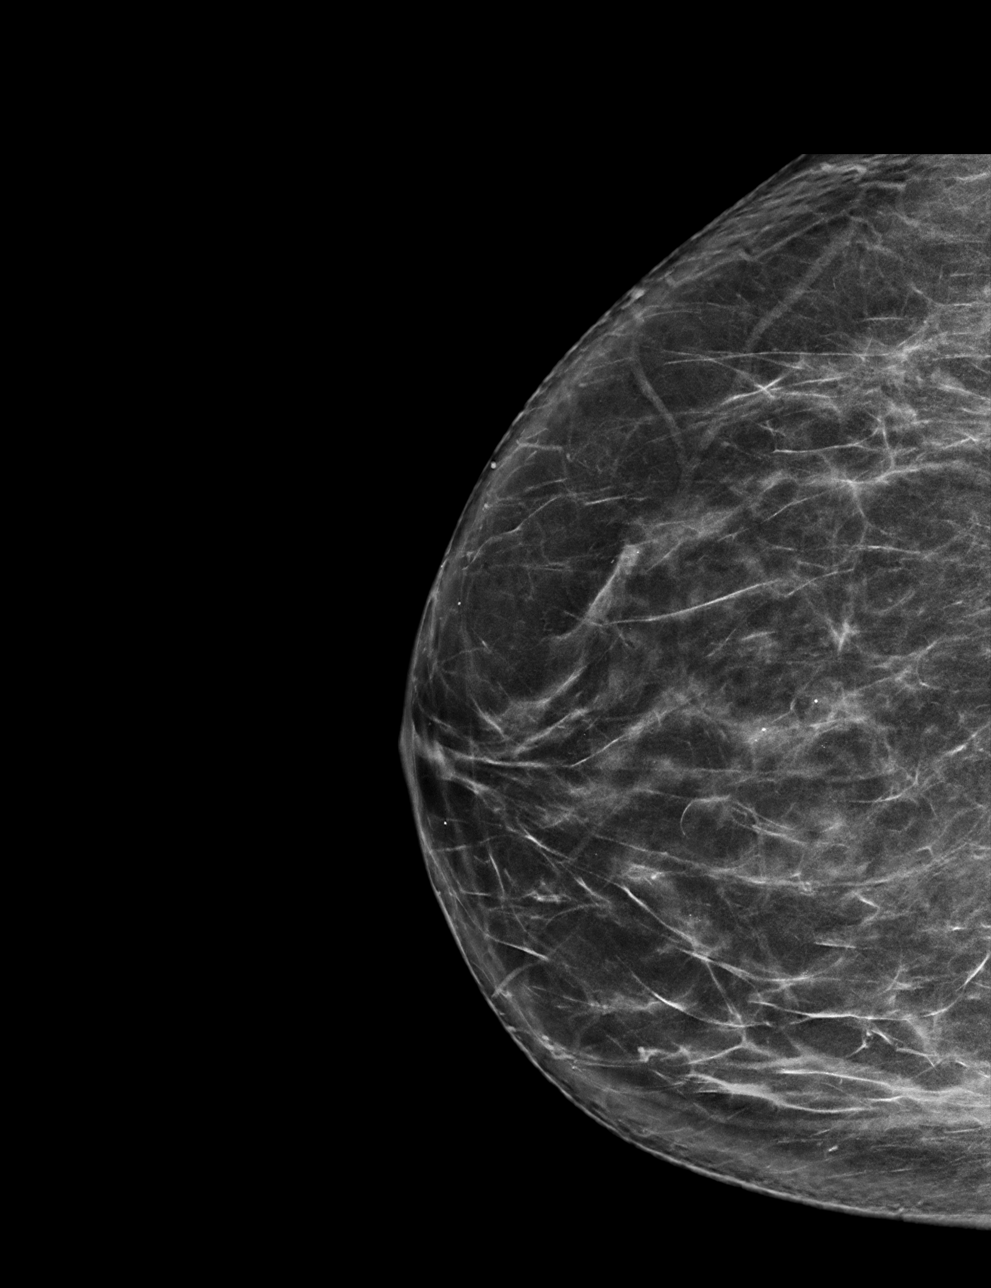

[R MLO synth-2D]
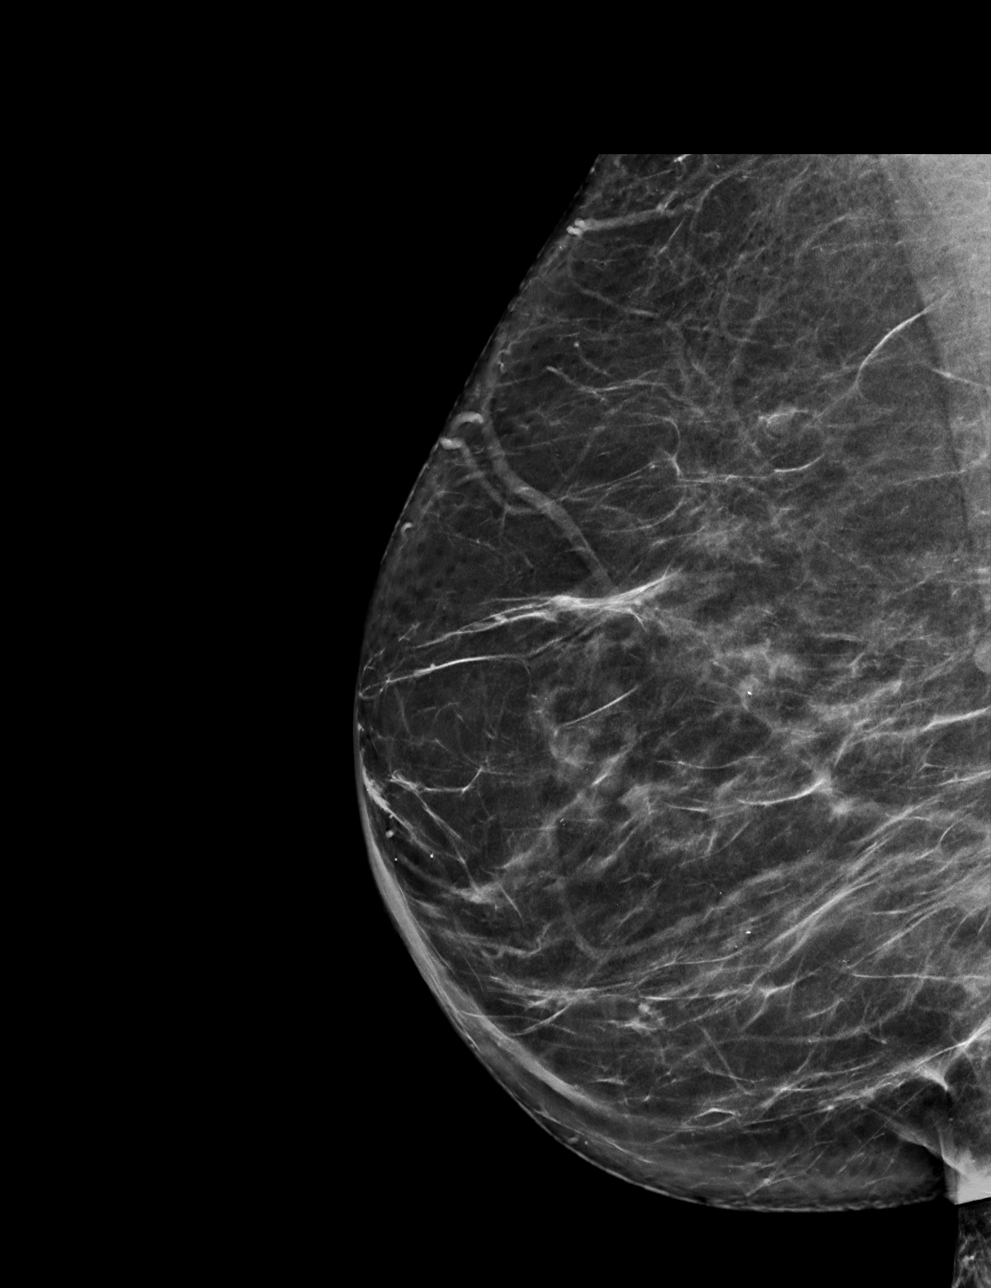

[R MLO tomo · tomo slice 38/75.0]
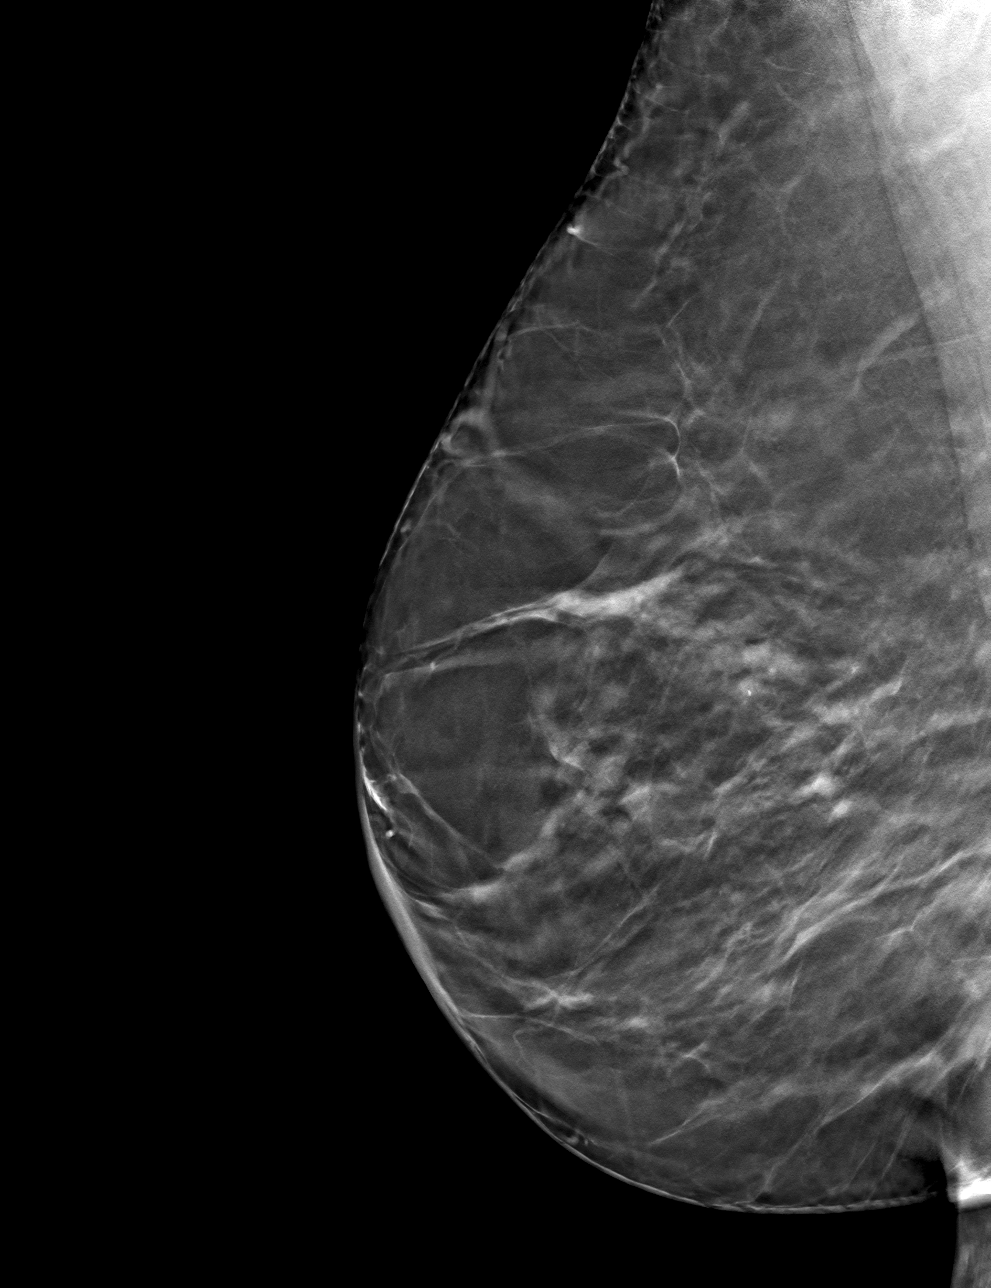

[R CC tomo · tomo slice 35/70.0]
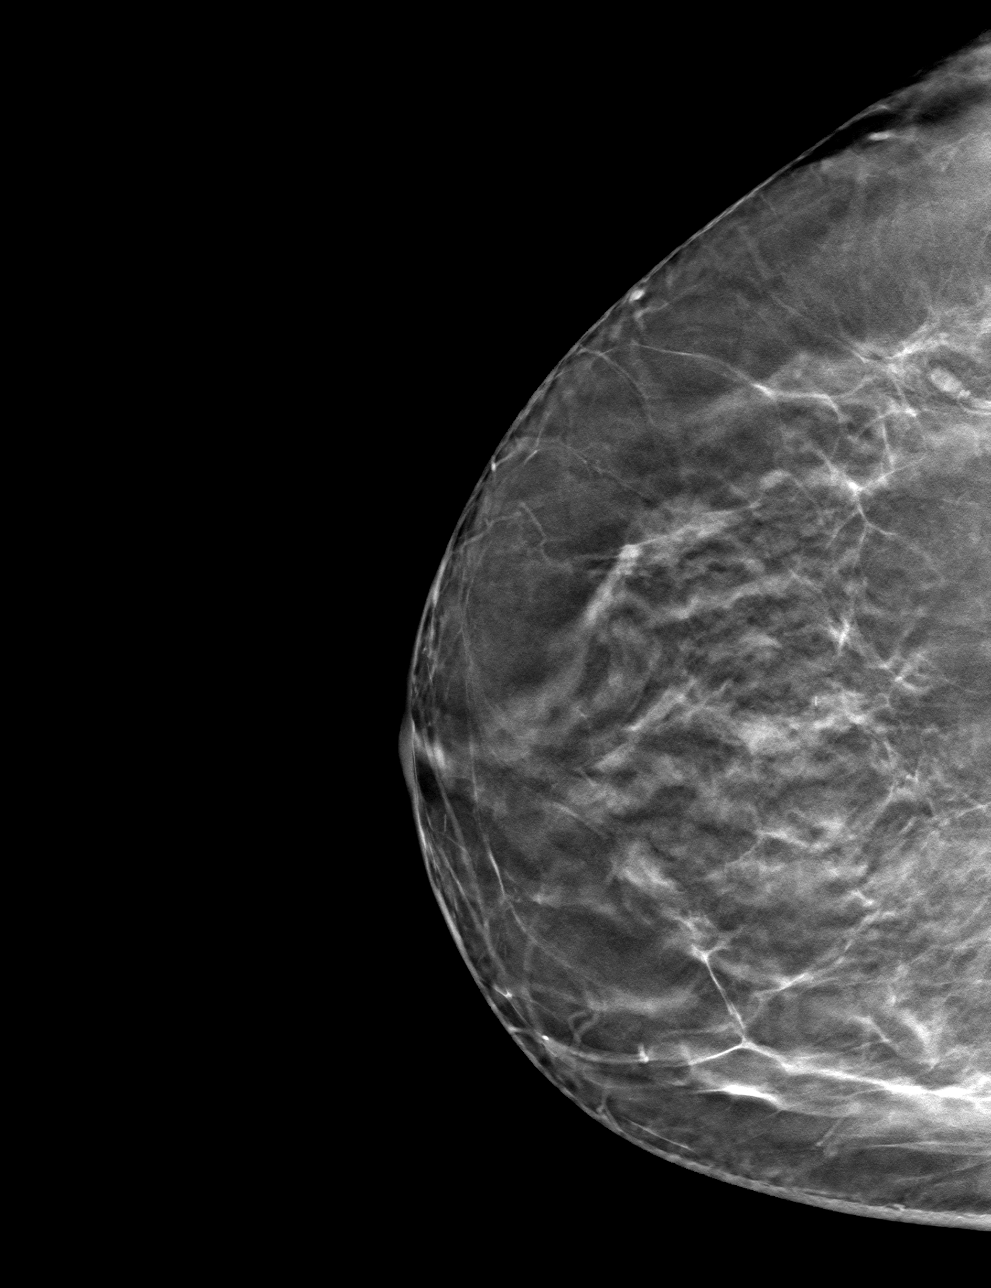

[4 of 12 positions shown; findings below may reference images not displayed]

ACR Breast Density Category b: There are scattered areas of
fibroglandular density.
FINDINGS: The patient has had a left mastectomy. There are no findings
suspicious for malignancy. Images were processed with CAD.
IMPRESSION: No mammographic evidence of malignancy. A result letter of this
screening mammogram will be mailed directly to the patient.

RECOMMENDATION:
Screening mammogram in one year.  (Code:H3-9-9K3)

BI-RADS CATEGORY  1: Negative.

## 2022-03-31 DIAGNOSIS — L814 Other melanin hyperpigmentation: Secondary | ICD-10-CM | POA: Diagnosis not present

## 2022-03-31 DIAGNOSIS — X32XXXS Exposure to sunlight, sequela: Secondary | ICD-10-CM | POA: Diagnosis not present

## 2022-03-31 DIAGNOSIS — Q825 Congenital non-neoplastic nevus: Secondary | ICD-10-CM | POA: Diagnosis not present

## 2022-03-31 DIAGNOSIS — D1801 Hemangioma of skin and subcutaneous tissue: Secondary | ICD-10-CM | POA: Diagnosis not present

## 2022-04-18 DIAGNOSIS — H04123 Dry eye syndrome of bilateral lacrimal glands: Secondary | ICD-10-CM | POA: Diagnosis not present

## 2022-04-18 DIAGNOSIS — R7309 Other abnormal glucose: Secondary | ICD-10-CM | POA: Diagnosis not present

## 2022-04-18 DIAGNOSIS — H25813 Combined forms of age-related cataract, bilateral: Secondary | ICD-10-CM | POA: Diagnosis not present

## 2022-04-18 DIAGNOSIS — H524 Presbyopia: Secondary | ICD-10-CM | POA: Diagnosis not present

## 2022-04-18 LAB — HM DIABETES EYE EXAM

## 2022-04-21 ENCOUNTER — Encounter: Payer: Self-pay | Admitting: Internal Medicine

## 2022-04-22 NOTE — Progress Notes (Signed)
Patient ID: KERBY HOCKLEY, female   DOB: 05-05-1956, 66 y.o.   MRN: [id number] ? ?3 MONTH FOLLOW UP ? ?Assessment and Plan:  ?Mariavictoria was seen today for annual exam. ? ?Diagnoses and all orders for this visit: ? ? ?Essential hypertension ?Continue current medications: ?Monitor blood pressure at home; call if consistently over 130/80 ?Continue DASH diet.   ?Reminder to go to the ER if any CP, SOB, nausea, dizziness, severe HA, changes vision/speech, left arm numbness and tingling and jaw pain. ?-     CBC with Differential/Platelet ?-     COMPLETE METABOLIC PANEL WITH GFR ? ?Hyperlipidemia ?Lifestyle diet controlled ?Discussed dietary and exercise modifications ?Low fat diet ?-     Lipid panel ? ?Abnormal glucose ?Discussed dietary and exercise modifications ?-     CMP ? ?Vitamin D deficiency ?Continue Vit D supplementation to maintain value in therapeutic level of 60-100  ? ?Obesity (BMI 32.37) ? Reviewed diet, exercise and weight control- educational material given to patient ?General weight loss/lifestyle modification strategies discussed (elicit support from others; identify saboteurs; non-food rewards, etc). ?Continue food diary ?Continue restricted calorie diet ?Continue daily exercise as well as behavior modification such as walking further away, putting down the fork, having a plan, using stairs, etc.   ? ?Gastroesophageal reflux disease, unspecified whether esophagitis present ?Doing well at this time ?Continue: famotidine '40mg'$  daily ?Diet discussed ?Monitor for triggers ?Avoid food with high acid content ?Avoid excessive cafeine ?Increase water intake ?-Magnesium ? ?Fatty liver ?Weight loss advised, avoid alcohol/tylenol, will monitor LFTs ? ?CKD Stage 2 (GFR 69 ml/min) ?Increase fluids  ?Avoid NSAIDS ?Blood pressure control ?Monitor sugars  ?Will continue to monitor ? ?Malignant neoplasm of left female breast, unspecified estrogen receptor status, unspecified site of breast ([place]) ?Monitored ? ?Irritable  bowel syndrome with constipation ?If not on benefiber then add it, decrease stress,  if any worsening symptoms, blood in stool, AB pain, etc call office ? ?Medication management ?MAGNESIUM ? ?Constipation ?Advised to continue increasing fiber and water ?Use Stool softener- if no relief notify the office ?Can not tolerate Linzess ? ? ? ? ?Future Appointments  ?Date Time Provider [place]  ?04/23/2022  9:00 AM Kinya Meine, Townsend Roger, NP [facility] None  ?07/24/2022  8:15 AM Tat, Eustace Quail, DO [facility] None  ?10/24/2022  9:00 AM Demetra Shiner Townsend Roger, NP [facility] None  ?  ? ?Continue diet and meds as discussed. Further disposition pending results of labs. ? ?HPI ?66 y.o. female  presents for CPE and follow up on HTN, HLD, GERD, abnormal glucose / prediabetes, IBS-C, CKDII, weight and vitamin D. ? ?She was diagnosed with Parkinson's in 02/2021 with Dr. Carles Collet. On Sinemet IR 1 tab TID. Symptoms are currently controlled. Tremor is controlled. ? ? Her blood pressure has been controlled at home, today their BP is BP: 114/70.   ?BP Readings from Last 3 Encounters:  ?04/23/22 114/70  ?01/09/22 118/60  ?10/24/21 128/76  ? ? ? She denies chest pain, shortness of breath, dizziness.  ?BMI is Body mass index is 32.37 kg/m?., she is working on diet and exercise. She is walking 8000-14000 steps a day ?Wt Readings from Last 3 Encounters:  ?04/23/22 177 lb (80.3 kg)  ?01/09/22 177 lb 9.6 oz (80.6 kg)  ?10/24/21 175 lb 12.8 oz (79.7 kg)  ? ?She has hypertensive CKD stage 2, she is on HCTZ. ?Lab Results  ?Component Value Date  ? GFRNONAA 76 03/14/2021  ? ?She has IBS-C , previously used Linzess with good  results.  Then it was causing too loose of stools. She is increasing fiber, water intake is 80-115 ounces a day. ? ? She is on cholesterol medication, zetia and denies myalgias. Her cholesterol is at goal. The cholesterol last visit was:   ?Lab Results  ?Component Value Date  ? CHOL 219 (H) 10/24/2021  ? HDL 68 10/24/2021  ? LDLCALC 131 (H)  10/24/2021  ? TRIG 97 10/24/2021  ? CHOLHDL 3.2 10/24/2021  ? ? She has been working on diet and exercise for prediabetes, and denies foot ulcerations, hyperglycemia, hypoglycemia , increased appetite, nausea, paresthesia of the feet, polydipsia, polyuria, visual disturbances, vomiting and weight loss. Last A1C in the office was:  ?Lab Results  ?Component Value Date  ? HGBA1C 5.6 10/24/2021  ? ?Patient is on Vitamin D supplement, 8000 a day.   ?Lab Results  ?Component Value Date  ? VD25OH 86 10/24/2021  ?  ?Current Medications:  ?Current Outpatient Medications on File Prior to Visit  ?Medication Sig Dispense Refill  ? BIOTIN 5000 PO Take by mouth.    ? carbidopa-levodopa (SINEMET IR) 25-100 MG tablet Take 1 tablet by mouth 3 (three) times daily. 270 tablet 1  ? Cholecalciferol (VITAMIN D3 PO) Take 5,000 mg by mouth daily.    ? ezetimibe (ZETIA) 10 MG tablet TAKE 1 TABLET BY MOUTH EVERY DAY FOR CHOLESTEROL 90 tablet 3  ? famotidine (PEPCID) 40 MG tablet Take  1 tablet  2 x /day  to Prevent Heartburn & Indigestion 180 tablet 3  ? fluticasone (FLONASE) 50 MCG/ACT nasal spray SPRAY 2 SPRAYS INTO EACH NOSTRIL EVERY DAY 48 mL 1  ? Methylcobalamin (B12-ACTIVE) 1 MG CHEW Chew 1 each by mouth daily.    ? Zinc Sulfate (ZINC 15 PO) Take by mouth.    ? ?No current facility-administered medications on file prior to visit.  ? ? ?Medical History:  ?Past Medical History:  ?Diagnosis Date  ? Acid reflux   ? Arthritis   ? [facility])   ? Left Breast  ? Complication of anesthesia   ? woke  up X 1  ? Fatty liver   ? High cholesterol   ? Hypertension   ? Spider bite 2016  ? Vitamin D deficiency   ? ?Immunization History  ?Administered Date(s) Administered  ? Influenza Inj Mdck Quad Pf 11/12/2019  ? Influenza Inj Mdck Quad With Preservative 10/06/2018, 10/13/2019  ? Influenza Split 08/29/2013, 10/16/2015  ? Influenza, High Dose Seasonal PF 09/29/2017, 10/16/2020, 10/24/2021  ? Influenza, Seasonal, Injecte, Preservative Fre 11/02/2014  ?  Influenza,inj,quad, With Preservative 09/24/2016  ? PFIZER(Purple Top)SARS-COV-2 Vaccination 03/10/2020, 03/31/2020  ? PNEUMOCOCCAL CONJUGATE-20 10/24/2021  ? PPD Test 03/14/2014, 07/11/2015  ? Pneumococcal Polysaccharide-23 05/22/2003  ? Td 05/28/2005  ? Tdap 07/11/2015  ? Zoster, Live 02/26/2016  ? ?Flu : 10/24/21 ?Tetanus: 2016 ?Pneumovax: 2004 ?Prevnar 20: 10/24/21 ?Flu vaccine: 2019 ?Zostavax: 2017 ? ?MGM: [facility] 10/30/2021 ?Colonoscopy: 2017 due this year, pt will schedule ?CT AB 2014 ?CXR 2018 ?DEXA 2007 ?PAP 2021- [facility] ? ?Patient Care Team: ?Unk Pinto, MD as PCP - General (Internal Medicine) ?Unk Pinto, MD as Referring Physician (Internal Medicine) ?Ludwig Clarks, DO as Consulting Physician (Neurology) ? ?Allergies ?Allergies  ?Allergen Reactions  ? Lipitor [Atorvastatin] Anaphylaxis and Swelling  ?  Other reaction(s): Other (See Comments) ?Unknown  ? Effexor [Venlafaxine]   ?  Other reaction(s): Other (See Comments)  ? Paxil [Paroxetine Hcl]   ?  Other reaction(s): Other (See Comments)  ?  Wellbutrin [Bupropion] Hives  ? ? ?SURGICAL HISTORY ?She  has a past surgical history that includes TRAM (1999-2000); Knee arthroscopy (1982); Wisdom tooth extraction; Cholecystectomy (11/19/2012); Breast surgery (1995); Hernia repair; and Mastectomy (Left, 1995). ?FAMILY HISTORY ?Her family history includes COPD in her sister; Cancer in her cousin; Gout (age of onset: 71) in her father; Hyperlipidemia in her brother and mother; Hypertension in her brother and mother. ?SOCIAL HISTORY ?She  reports that she has never smoked. She has never used smokeless tobacco. She reports current alcohol use of about 1.0 standard drink per week. She reports that she does not use drugs. ? ?Review of Systems:  ?Review of Systems  ?Constitutional:  Negative for chills, fever and malaise/fatigue.  ?HENT:  Negative for congestion, ear pain, hearing loss, sinus pain, sore throat and tinnitus.   ?Eyes:   Negative for blurred vision and double vision.  ?Respiratory:  Negative for cough, hemoptysis, sputum production, shortness of breath and wheezing.   ?Cardiovascular:  Negative for chest pain, palpitations and leg

## 2022-04-23 ENCOUNTER — Ambulatory Visit (INDEPENDENT_AMBULATORY_CARE_PROVIDER_SITE_OTHER): Payer: BC Managed Care – PPO | Admitting: Nurse Practitioner

## 2022-04-23 ENCOUNTER — Encounter: Payer: Self-pay | Admitting: Nurse Practitioner

## 2022-04-23 VITALS — BP 114/70 | HR 68 | Temp 97.9°F | Resp 16 | Ht 62.0 in | Wt 177.0 lb

## 2022-04-23 DIAGNOSIS — K5909 Other constipation: Secondary | ICD-10-CM

## 2022-04-23 DIAGNOSIS — K219 Gastro-esophageal reflux disease without esophagitis: Secondary | ICD-10-CM

## 2022-04-23 DIAGNOSIS — K581 Irritable bowel syndrome with constipation: Secondary | ICD-10-CM

## 2022-04-23 DIAGNOSIS — K760 Fatty (change of) liver, not elsewhere classified: Secondary | ICD-10-CM

## 2022-04-23 DIAGNOSIS — I10 Essential (primary) hypertension: Secondary | ICD-10-CM | POA: Diagnosis not present

## 2022-04-23 DIAGNOSIS — N182 Chronic kidney disease, stage 2 (mild): Secondary | ICD-10-CM

## 2022-04-23 DIAGNOSIS — C50912 Malignant neoplasm of unspecified site of left female breast: Secondary | ICD-10-CM | POA: Diagnosis not present

## 2022-04-23 DIAGNOSIS — Z6832 Body mass index (BMI) 32.0-32.9, adult: Secondary | ICD-10-CM

## 2022-04-23 DIAGNOSIS — R7309 Other abnormal glucose: Secondary | ICD-10-CM | POA: Diagnosis not present

## 2022-04-23 DIAGNOSIS — Z79899 Other long term (current) drug therapy: Secondary | ICD-10-CM

## 2022-04-23 DIAGNOSIS — E782 Mixed hyperlipidemia: Secondary | ICD-10-CM | POA: Diagnosis not present

## 2022-04-23 DIAGNOSIS — E559 Vitamin D deficiency, unspecified: Secondary | ICD-10-CM

## 2022-04-23 DIAGNOSIS — E66811 Obesity, class 1: Secondary | ICD-10-CM

## 2022-04-23 DIAGNOSIS — E6609 Other obesity due to excess calories: Secondary | ICD-10-CM

## 2022-04-23 NOTE — Patient Instructions (Signed)
Take senokot or colace daily ? ?Constipation, Adult ?Constipation is when a person has fewer than three bowel movements in a week, has difficulty having a bowel movement, or has stools (feces) that are dry, hard, or larger than normal. Constipation may be caused by an underlying condition. It may become worse with age if a person takes certain medicines and does not take in enough fluids. ?Follow these instructions at home: ?Eating and drinking ? ?Eat foods that have a lot of fiber, such as beans, whole grains, and fresh fruits and vegetables. ?Limit foods that are low in fiber and high in fat and processed sugars, such as fried or sweet foods. These include french fries, hamburgers, cookies, candies, and soda. ?Drink enough fluid to keep your urine pale yellow. ?General instructions ?Exercise regularly or as told by your health care provider. Try to do 150 minutes of moderate exercise each week. ?Use the bathroom when you have the urge to go. Do not hold it in. ?Take over-the-counter and prescription medicines only as told by your health care provider. This includes any fiber supplements. ?During bowel movements: ?Practice deep breathing while relaxing the lower abdomen. ?Practice pelvic floor relaxation. ?Watch your condition for any changes. Let your health care provider know about them. ?Keep all follow-up visits as told by your health care provider. This is important. ?Contact a health care provider if: ?You have pain that gets worse. ?You have a fever. ?You do not have a bowel movement after 4 days. ?You vomit. ?You are not hungry or you lose weight. ?You are bleeding from the opening between the buttocks (anus). ?You have thin, pencil-like stools. ?Get help right away if: ?You have a fever and your symptoms suddenly get worse. ?You leak stool or have blood in your stool. ?Your abdomen is bloated. ?You have severe pain in your abdomen. ?You feel dizzy or you faint. ?Summary ?Constipation is when a person has  fewer than three bowel movements in a week, has difficulty having a bowel movement, or has stools (feces) that are dry, hard, or larger than normal. ?Eat foods that have a lot of fiber, such as beans, whole grains, and fresh fruits and vegetables. ?Drink enough fluid to keep your urine pale yellow. ?Take over-the-counter and prescription medicines only as told by your health care provider. This includes any fiber supplements. ?This information is not intended to replace advice given to you by your health care provider. Make sure you discuss any questions you have with your health care provider. ?Document Revised: 11/02/2019 Document Reviewed: 11/02/2019 ?Elsevier Patient Education ? [place]. ? ?

## 2022-04-24 LAB — CBC WITH DIFFERENTIAL/PLATELET
Absolute Monocytes: 551 cells/uL (ref 200–950)
Basophils Absolute: 41 cells/uL (ref 0–200)
Basophils Relative: 0.6 %
Eosinophils Absolute: 61 cells/uL (ref 15–500)
Eosinophils Relative: 0.9 %
HCT: 38 % (ref 35.0–45.0)
Hemoglobin: 12.7 g/dL (ref 11.7–15.5)
Lymphs Abs: 1639 cells/uL (ref 850–3900)
MCH: 29.9 pg (ref 27.0–33.0)
MCHC: 33.4 g/dL (ref 32.0–36.0)
MCV: 89.4 fL (ref 80.0–100.0)
MPV: 10.8 fL (ref 7.5–12.5)
Monocytes Relative: 8.1 %
Neutro Abs: 4508 cells/uL (ref 1500–7800)
Neutrophils Relative %: 66.3 %
Platelets: 219 10*3/uL (ref 140–400)
RBC: 4.25 10*6/uL (ref 3.80–5.10)
RDW: 13 % (ref 11.0–15.0)
Total Lymphocyte: 24.1 %
WBC: 6.8 10*3/uL (ref 3.8–10.8)

## 2022-04-24 LAB — LIPID PANEL
Cholesterol: 202 mg/dL — ABNORMAL HIGH (ref <200)
HDL: 69 mg/dL (ref >=50)
LDL Cholesterol (Calc): 114 mg/dL (calc) — ABNORMAL HIGH
Non-HDL Cholesterol (Calc): 133 mg/dL (calc) — ABNORMAL HIGH (ref <130)
Total CHOL/HDL Ratio: 2.9 (calc) (ref <5.0)
Triglycerides: 88 mg/dL (ref <150)

## 2022-04-24 LAB — COMPLETE METABOLIC PANEL WITH GFR
AG Ratio: 1.9 (calc) (ref 1.0–2.5)
ALT: 11 U/L (ref 6–29)
AST: 18 U/L (ref 10–35)
Albumin: 4.4 g/dL (ref 3.6–5.1)
Alkaline phosphatase (APISO): 53 U/L (ref 37–153)
BUN: 17 mg/dL (ref 7–25)
CO2: 23 mmol/L (ref 20–32)
Calcium: 9.9 mg/dL (ref 8.6–10.4)
Chloride: 102 mmol/L (ref 98–110)
Creat: 0.87 mg/dL (ref 0.50–1.05)
Globulin: 2.3 g/dL (calc) (ref 1.9–3.7)
Glucose, Bld: 92 mg/dL (ref 65–99)
Potassium: 4.1 mmol/L (ref 3.5–5.3)
Sodium: 138 mmol/L (ref 135–146)
Total Bilirubin: 0.5 mg/dL (ref 0.2–1.2)
Total Protein: 6.7 g/dL (ref 6.1–8.1)
eGFR: 73 mL/min/{1.73_m2} (ref >=60)

## 2022-04-24 LAB — MAGNESIUM: Magnesium: 1.9 mg/dL (ref 1.5–2.5)

## 2022-04-25 ENCOUNTER — Encounter: Payer: Self-pay | Admitting: Nurse Practitioner

## 2022-04-25 ENCOUNTER — Ambulatory Visit: Payer: BC Managed Care – PPO | Admitting: Nurse Practitioner

## 2022-05-18 ENCOUNTER — Other Ambulatory Visit: Payer: Self-pay | Admitting: Neurology

## 2022-06-21 ENCOUNTER — Other Ambulatory Visit: Payer: Self-pay | Admitting: Neurology

## 2022-07-22 NOTE — Progress Notes (Unsigned)
Assessment/Plan:   1.  Parkinsons Disease, diagnosed March, 2022  -continue carbidopa/levodopa 25/100, 1 at 7am/11am/4pm.  She is just slightly underdosed but she feels well so we didn't change anything today  -We discussed that it used to be thought that levodopa would increase risk of melanoma but now it is believed that Parkinsons itself likely increases risk of melanoma. she is to get regular skin checks.  She is doing that   Subjective:   Jenna Miller was seen today in follow up for newly diagnosed Parkinsons disease.  My previous records were reviewed prior to todays visit as well as outside records available to me.  Pt denies falls.  Pt denies lightheadedness, near syncope.  No hallucinations.  She is walking 8K-14K steps per day.  Mood has been good.  Current prescribed movement disorder medications: Carbidopa/levodopa 25/100, 1 tablet 3 times per day    PREVIOUS MEDICATIONS: Sinemet  ALLERGIES:   Allergies  Allergen Reactions   Lipitor [Atorvastatin] Anaphylaxis and Swelling    Other reaction(s): Other (See Comments) Unknown   Effexor [Venlafaxine]     Other reaction(s): Other (See Comments)   Paxil [Paroxetine Hcl]     Other reaction(s): Other (See Comments)   Wellbutrin [Bupropion] Hives    CURRENT MEDICATIONS:  Outpatient Encounter Medications as of 07/24/2022  Medication Sig   BIOTIN 5000 PO Take by mouth.   carbidopa-levodopa (SINEMET IR) 25-100 MG tablet Take 1 tablet by mouth 3 (three) times daily.   Cholecalciferol (VITAMIN D3 PO) Take 5,000 mg by mouth daily.   ezetimibe (ZETIA) 10 MG tablet TAKE 1 TABLET BY MOUTH EVERY DAY FOR CHOLESTEROL   famotidine (PEPCID) 40 MG tablet Take  1 tablet  2 x /day  to Prevent Heartburn & Indigestion   fluticasone (FLONASE) 50 MCG/ACT nasal spray SPRAY 2 SPRAYS INTO EACH NOSTRIL EVERY DAY   Methylcobalamin (B12-ACTIVE) 1 MG CHEW Chew 1 each by mouth daily.   Zinc Sulfate (ZINC 15 PO) Take by mouth.   No  facility-administered encounter medications on file as of 07/24/2022.    Objective:   PHYSICAL EXAMINATION:    VITALS:   Vitals:   07/24/22 0745  BP: 126/77  Pulse: 76  SpO2: 99%  Weight: 176 lb (79.8 kg)  Height: '5\' 2"'$  (1.575 m)      GEN:  The patient appears stated age and is in NAD. HEENT:  Normocephalic, atraumatic.  The mucous membranes are moist. The superficial temporal arteries are without ropiness or tenderness. CV:  RRR Lungs:  CTAB Neck/HEME:  There are no carotid bruits bilaterally.  Neurological examination:  Orientation: The patient is alert and oriented x3. Cranial nerves: There is good facial symmetry with facial hypomimia. The speech is fluent and clear. Soft palate rises symmetrically and there is no tongue deviation. Hearing is intact to conversational tone. Sensation: Sensation is intact to light touch throughout Motor: Strength is at least antigravity x4.  Movement examination: Tone: There is mild increased tone in the LUE Abnormal movements:no tremor Coordination:  There is mild decremation with RAM's, with any form of RAMS, including alternating supination and pronation of the forearm, hand opening and closing, finger taps, heel taps and toe taps, on the L Gait and Station: The patient has no difficulty arising out of a deep-seated chair without the use of the hands. The patient's stride length is good with purposeful arm swing bilaterally  I have reviewed and interpreted the following labs independently    Chemistry  Component Value Date/Time   NA 138 04/23/2022 0916   K 4.1 04/23/2022 0916   CL 102 04/23/2022 0916   CO2 23 04/23/2022 0916   BUN 17 04/23/2022 0916   CREATININE 0.87 04/23/2022 0916      Component Value Date/Time   CALCIUM 9.9 04/23/2022 0916   ALKPHOS 52 02/20/2017 0934   AST 18 04/23/2022 0916   ALT 11 04/23/2022 0916   BILITOT 0.5 04/23/2022 0916       Lab Results  Component Value Date   WBC 6.8 04/23/2022    HGB 12.7 04/23/2022   HCT 38.0 04/23/2022   MCV 89.4 04/23/2022   PLT 219 04/23/2022    Lab Results  Component Value Date   TSH 0.80 10/24/2021   Total time spent on today's visit was 20 minutes, including both face-to-face time and nonface-to-face time.  Time included that spent on review of records (prior notes available to me/labs/imaging if pertinent), discussing treatment and goals, answering patient's questions and coordinating care.    Cc:  Unk Pinto, MD

## 2022-07-24 ENCOUNTER — Encounter: Payer: Self-pay | Admitting: Neurology

## 2022-07-24 ENCOUNTER — Ambulatory Visit: Payer: BC Managed Care – PPO | Admitting: Neurology

## 2022-07-24 VITALS — BP 126/77 | HR 76 | Ht 62.0 in | Wt 176.0 lb

## 2022-07-24 DIAGNOSIS — G20 Parkinson's disease: Secondary | ICD-10-CM | POA: Diagnosis not present

## 2022-07-24 MED ORDER — CARBIDOPA-LEVODOPA 25-100 MG PO TABS: 1.0000 | ORAL_TABLET | Freq: Three times a day (TID) | ORAL | 2 refills | Status: DC

## 2022-07-24 NOTE — Patient Instructions (Signed)
Local and Online Resources for Power over Parkinson's Group June 2023  LOCAL [place] PARKINSON'S GROUPS  Power over Parkinson's Group:   Power Over Parkinson's Patient Education Group will be Wednesday, June 14th-*Hybrid meting*- in person at [place] [facility] location and via WEBEX at 2:00 pm.   Upcoming Power over Parkinson's Meetings:  2nd Wednesdays of the month at 2 pm:   June 14th, July 12th Contact Amy Marriott at [email or web address] if interested in participating in this group Parkinson's Care Partners Group:    3rd Mondays, Contact Misty Paladino Atypical Parkinsonian Patient Group:   4th Wednesdays, Contact Misty Paladino If you are interested in participating in these groups with Misty, please contact her directly for how to join those meetings.  Her contact information is [email or web address].    LOCAL EVENTS AND NEW OFFERINGS Dance Class for People with Parkinson's at [employer].  Friday, June 9th at 2 pm.  Led by [employer] DPT students.  Contact [email or web address] to register or with questions. Ice Cream Social at [employer]!  Thursday, June 15th, 5:30-7:00 pm.  RSVP to [email or web address] for attendance and free ice cream. Parkinson's T-shirts for sale!  Designed by a local group member, with funds going to Movement Disorders Fund.  $25.00  Contact Misty to purchase  New PWR! Moves Community Fitness Instructor-Led Class offering at [employer]!  Wednesdays 1-2 pm, starting April 12th.   Contact Susan Laney, Fitness Manager at [employer].  [email or web address]  ONLINE EDUCATION AND SUPPORT Parkinson Foundation:  www.parkinson.org PD Health at Home continues:  Mindfulness Mondays, Wellness Wednesdays, Fitness Fridays  Upcoming Education: Parkinson's 101:  What You and Your Family Should Know.  Wednesday, June 7th at 1:00 pm Register for expert briefings (webinars) at  https://www.parkinson.org/resources-support/online-education/expert-briefings-webinars Please check out their website to sign up for emails and see their full online offerings   [employer] Foundation:  www.michaeljfox.org  Third Thursday Webinars:  On the third Thursday of every month at 12 p.m. ET, join our free live webinars to learn about various aspects of living with Parkinson's disease and our work to speed medical breakthroughs. Upcoming Webinar: REPLAY:  From Low Blood Pressure to Bladder Problems:  A Look at Lesser Known Parkinson's Symptoms.  Thursday, June 15th at 12 noon. Check out additional information on their website to see their full online offerings  [employer] Phinney Foundation:  www.davisphinneyfoundation.org Upcoming Webinar:   Stay tuned Webinar Series:  Living with Parkinson's Meetup.   Third Thursdays each month, 3 pm Care Partner Monthly Meetup.  With Connie Carpenter Phinney.  First Tuesday of each month, 2 pm Check out additional information to Live Well Today on their website  Parkinson and Movement Disorders (PMD) Alliance:  www.pmdalliance.org [employer] Online:  Online Education Events Sign up for emails, which are sent weekly to give you updates on programming and online offerings  Parkinson's Association of [place]:  www.parkinsonassociation.org Information on online support groups, education events, and online exercises including Yoga, Parkinson's exercises and more-LOTS of information on links to PD resources and online events Virtual Support Group through Parkinson's Association of [place]; next one is scheduled for Wednesday, June 7th at 2 pm. (These are typically scheduled for the 1st Wednesday of the month at 2 pm).  Visit website for details. Save the date for "Caring for Parkinson's-Caring for You", 9th Annual Symposium.  In-person event in [place].  September 9th.  More info on registration to come. MOVEMENT AND EXERCISE OPPORTUNITIES PWR!  Moves Classes at [facility].  Wednesdays 10 and 11   am.   Contact Amy Marriott, PT [email or web address] if interested. NEW PWR! Moves Class offering at [employer].  Wednesdays 1-2 pm, starting April 12th.  Contact Susan Laney, Fitness Manager at [employer].  [email or web address] Here is a link to the PWR!Moves classes on Zoom from [place] Parkinson's Foundation - Daily Mon-Sat at 10:00. Via Zoom, FREE and open to all.  There is also a link below via Facebook if you use that platform.  https://www.parkinsonsmi.org/mpf-programs/exercise-and-movement-activities https://www.facebook.com/ParkinsonsMI.org/posts/pwr-moves-exercise-class-parkinson-wellness-recovery-online-with-angee-ludwa-pt-/10156827878021813/  Parkinson's Wellness Recovery (PWR! Moves)  www.pwr4life.org Info on the PWR! Virtual Experience:  You will have access to our expertise through self-assessment, guided plans that start with the PD-specific fundamentals, educational content, tips, Q&A with an expert, and a growing library of PD-specific pre-recorded and live exercise classes of varying types and intensity - both physical and cognitive! If that is not enough, we offer 1:1 wellness consultations (in-person or virtual) to personalize your PWR! Virtual Experience.  Parkinson Foundation Fitness Fridays:  As part of the PD Health @ Home program, this free video series focuses each week on one aspect of fitness designed to support people living with Parkinson's.  These weekly videos highlight the Parkinson Foundation recent fitness guidelines for people with Parkinson's disease. www.parkinson.org/resources-support/online-education/pdhealth#ff Dance for PD website is offering free, live-stream classes throughout the week, as well as links to digital library of classes:  https://danceforparkinsons.org/ Virtual dance and Pilates for Parkinson's classes: Click on the Community Tab> Parkinson's Movement Initiative  Tab.  To register for classes and for more information, visit www.americandancefestival.org and click the "community" tab.  [employer] Parkinson's Cycling Classes  [employer]:  Thursdays @ Noon-Live classes at [employer] (Contact Margaret Hazen at [email or web address] or [phone]) [employer]: Virtual Classes Mondays and Thursdays /Live classes Tuesday, Wednesday and Thursday (contact Marlee at [email or web address]  or [phone]) [place] [employer] Varied levels of classes are offered Tuesdays and Thursdays at [employer].  Stretching with Maria weekly class is also offered for people with Parkinson's To observe a class or for more information, call [phone] or email Hillary Savage at [email or web address] ADDITIONAL SUPPORT AND RESOURCES [employer]:Online Caregiver Education Opportunities:  www.well-springsolutions.org/caregiver-education/caregiver-support-group.  You may also contact Jodi Kolada at [email or web address] or [phone].    [facility] Navigator:  Just1Navigator program, a free service to help individuals and families through the journey of determining care for older adults.  The "Navigator" is a social worker, Nicole Reynolds, who will speak with a prospective client and/or loved ones to provide an assessment of the situation and a set of recommendations for a personalized care plan -- all free of charge, and whether [employer] offers the needed service or not. If the need is not a service we provide, we are well-connected with reputable programs in town that we can refer you to.  www.well-springsolutions.org or to speak with the Navigator, call [phone]. Family Caregiver Programming in June:  [employer], Thursday, June 15th 11-12:30 at [employer], [place].  Call [phone] to register  

## 2022-08-14 ENCOUNTER — Other Ambulatory Visit: Payer: Self-pay | Admitting: Internal Medicine

## 2022-09-08 ENCOUNTER — Ambulatory Visit: Payer: BC Managed Care – PPO | Admitting: Nurse Practitioner

## 2022-09-08 ENCOUNTER — Telehealth: Payer: Self-pay | Admitting: Neurology

## 2022-09-08 NOTE — Telephone Encounter (Signed)
Called patient and let her know it is ok to take the cold medication and her carbidopa levodopa

## 2022-09-08 NOTE — Telephone Encounter (Signed)
Patient called and states that she has had a cough and her PCP recommended her to take Delsym. She was reading the label and it states something about Parkinson and medication so she wants to make sure she is ok to take the medication  please call

## 2022-09-23 ENCOUNTER — Encounter: Payer: Self-pay | Admitting: Nurse Practitioner

## 2022-09-23 ENCOUNTER — Ambulatory Visit: Payer: BC Managed Care – PPO | Admitting: Nurse Practitioner

## 2022-09-23 VITALS — BP 118/70 | HR 71 | Temp 97.7°F | Ht 62.0 in | Wt 177.8 lb

## 2022-09-23 DIAGNOSIS — M6289 Other specified disorders of muscle: Secondary | ICD-10-CM

## 2022-09-23 DIAGNOSIS — R200 Anesthesia of skin: Secondary | ICD-10-CM

## 2022-09-23 DIAGNOSIS — G20 Parkinson's disease: Secondary | ICD-10-CM | POA: Diagnosis not present

## 2022-09-23 DIAGNOSIS — M79601 Pain in right arm: Secondary | ICD-10-CM | POA: Diagnosis not present

## 2022-09-23 DIAGNOSIS — J0100 Acute maxillary sinusitis, unspecified: Secondary | ICD-10-CM

## 2022-09-23 DIAGNOSIS — R202 Paresthesia of skin: Secondary | ICD-10-CM

## 2022-09-23 MED ORDER — DEXAMETHASONE SODIUM PHOSPHATE 10 MG/ML IJ SOLN
10.0000 mg | Freq: Once | INTRAMUSCULAR | Status: AC
  Administered 2022-09-23: 10 mg via INTRAMUSCULAR

## 2022-09-23 MED ORDER — GABAPENTIN 100 MG PO CAPS: 100.0000 mg | ORAL_CAPSULE | Freq: Two times a day (BID) | ORAL | 0 refills | Status: DC

## 2022-09-23 MED ORDER — FLUTICASONE PROPIONATE 50 MCG/ACT NA SUSP: NASAL | 1 refills | Status: DC

## 2022-09-23 NOTE — Progress Notes (Signed)
Assessment and Plan:  Jenna Miller was seen today for an episodic visit.  Diagnoses and all order for this visit:  1. Pain in right arm Continue to monitor for unresolved pain. Contact office if noticed.  - dexamethasone (DECADRON) injection 10 mg  2. Muscle stiffness Apply ice/heat PRN. Topical anti-inflammatory cream, Voltaren/Bio Freeze PRN.  - dexamethasone (DECADRON) injection 10 mg  3. Numbness and tingling Continue to monitor If s/s fail to improve contact office  - gabapentin (NEURONTIN) 100 MG capsule; Take 1 capsule (100 mg total) by mouth 2 (two) times daily.  Dispense: 60 capsule; Refill: 0  4. Parkinson's disease ([place]) Continue to follow with Neurology as directed.  5. Acute non-recurrent maxillary sinusitis Avoid triggers Stay well hydrated.  - fluticasone (FLONASE) 50 MCG/ACT nasal spray; SPRAY 2 SPRAYS INTO EACH NOSTRIL EVERY DAY  Dispense: 48 mL; Refill: 1   Notify office for further evaluation and treatment, questions or concerns if s/s fail to improve. The risks and benefits of my recommendations, as well as other treatment options were discussed with the patient today. Questions were answered.  Further disposition pending results of labs. Discussed med's effects and SE's.    Over 20 minutes of exam, counseling, chart review, and critical decision making was performed.   Future Appointments  Date Time Provider [place]  10/24/2022  9:00 AM Alycia Rossetti, NP [facility] None  01/26/2023  8:15 AM Tat, Eustace Quail, DO [facility] None    ------------------------------------------------------------------------------------------------------------------   HPI  66 y.o.female presents for evaluation of left shoulder and arm pain with associated numbness and tingling in the right thumb.  She feels as though the onset was after receiving the Flu vaccine in her right arm on 09/10/22 at work.  She reports pain with moving the arm, feels stiff.   She has been using Biofreeze cream with  minimal effectiveness.  Denies any systemic symptoms including fever, chills, N/V.    She has a hx of allergic rhinitis.  Allergy symptoms have been more pronounced over the last week.  She uses Flonase with effectiveness.  She continues to follow Neurology for Parkinson's.  Past Medical History:  Diagnosis Date   Acid reflux    Arthritis    Cancer ([place])    Left Breast   Complication of anesthesia    woke  up X 1   Fatty liver    High cholesterol    Hypertension    Spider bite 2016   Vitamin D deficiency      Allergies  Allergen Reactions   Lipitor [Atorvastatin] Anaphylaxis and Swelling    Other reaction(s): Other (See Comments) Unknown   Effexor [Venlafaxine]     Other reaction(s): Other (See Comments)   Paxil [Paroxetine Hcl]     Other reaction(s): Other (See Comments)   Wellbutrin [Bupropion] Hives    Current Outpatient Medications on File Prior to Visit  Medication Sig   BIOTIN 5000 PO Take by mouth.   carbidopa-levodopa (SINEMET IR) 25-100 MG tablet Take 1 tablet by mouth 3 (three) times daily.   Cholecalciferol (VITAMIN D3 PO) Take 5,000 mg by mouth daily.   ezetimibe (ZETIA) 10 MG tablet TAKE 1 TABLET BY MOUTH EVERY DAY FOR CHOLESTEROL   famotidine (PEPCID) 40 MG tablet TAKE 1 TABLET 2 X /DAY TO PREVENT HEARTBURN & INDIGESTION   Methylcobalamin (B12-ACTIVE) 1 MG CHEW Chew 1 each by mouth daily.   Zinc Sulfate (ZINC 15 PO) Take by mouth.   No current facility-administered medications on file  prior to visit.    ROS: all negative except what is noted in the HPI.   Physical Exam:  BP 118/70   Pulse 71   Temp 97.7 F (36.5 C)   Ht '5\' 2"'$  (1.575 m)   Wt 177 lb 12.8 oz (80.6 kg)   LMP 06/10/2010   SpO2 98%   BMI 32.52 kg/m   General Appearance: NAD.  Awake, conversant and cooperative. Eyes: PERRLA, EOMs intact.  Sclera white.  Conjunctiva without erythema. Sinuses: No frontal/maxillary tenderness.  No nasal  discharge. Nares patent.  ENT/Mouth: Ext aud canals clear.  Bilateral TMs w/DOL and without erythema or bulging. Hearing intact.  Posterior pharynx without swelling or exudate.  Tonsils without swelling or erythema.  Neck: Supple.  No masses, nodules or thyromegaly. Respiratory: Effort is regular with non-labored breathing. Breath sounds are equal bilaterally without rales, rhonchi, wheezing or stridor.  Cardio: RRR with no MRGs. Brisk peripheral pulses without edema.  Abdomen: Active BS in all four quadrants.  Soft and non-tender without guarding, rebound tenderness, hernias or masses. Lymphatics: Non tender without lymphadenopathy.  Musculoskeletal: Full ROM, 5/5 strength, normal ambulation.  No clubbing or cyanosis. Skin: Appropriate color for ethnicity. Warm without rashes, lesions, ecchymosis, ulcers.  Neuro: CN II-XII grossly normal. Normal muscle tone without cerebellar symptoms and intact sensation.   Psych: AO X 3,  appropriate mood and affect, insight and judgment.     Darrol Jump, NP 11:36 AM [facility]

## 2022-09-24 ENCOUNTER — Telehealth: Payer: Self-pay | Admitting: Neurology

## 2022-09-24 NOTE — Telephone Encounter (Signed)
Called patient and gave patient Dr. Arturo Morton recommendations on her medication she had no more questions

## 2022-09-24 NOTE — Telephone Encounter (Signed)
Patient states that she had a Flu Shot on the 13th they hit a nerve and the arm is swelling. Patient PCP gave her Gabapentin to help with the swelling. She wants to know if she can take that with the Carbidopa levodopa. If she cant will you please let her know what she can take

## 2022-09-29 ENCOUNTER — Encounter: Payer: Self-pay | Admitting: Nurse Practitioner

## 2022-10-01 MED ORDER — IBUPROFEN 800 MG PO TABS: 800.0000 mg | ORAL_TABLET | Freq: Every day | ORAL | 0 refills | Status: DC | PRN

## 2022-10-03 ENCOUNTER — Other Ambulatory Visit: Payer: Self-pay | Admitting: Internal Medicine

## 2022-10-03 DIAGNOSIS — Z1231 Encounter for screening mammogram for malignant neoplasm of breast: Secondary | ICD-10-CM

## 2022-10-10 ENCOUNTER — Ambulatory Visit: Payer: BC Managed Care – PPO | Admitting: Physician Assistant

## 2022-10-10 ENCOUNTER — Encounter: Payer: Self-pay | Admitting: Physician Assistant

## 2022-10-10 ENCOUNTER — Telehealth: Payer: Self-pay | Admitting: Nurse Practitioner

## 2022-10-10 ENCOUNTER — Ambulatory Visit: Payer: Self-pay

## 2022-10-10 ENCOUNTER — Ambulatory Visit (INDEPENDENT_AMBULATORY_CARE_PROVIDER_SITE_OTHER): Payer: BC Managed Care – PPO

## 2022-10-10 DIAGNOSIS — M79601 Pain in right arm: Secondary | ICD-10-CM

## 2022-10-10 DIAGNOSIS — M79603 Pain in arm, unspecified: Secondary | ICD-10-CM | POA: Insufficient documentation

## 2022-10-10 NOTE — Progress Notes (Signed)
Office Visit Note   Patient: Jenna Miller           Date of Birth: 1956-03-10           MRN: [id number] Visit Date: 10/10/2022              Requested by: Unk Pinto, [place] [place],  [place] [address] PCP: Unk Pinto, MD  Chief Complaint  Patient presents with   Right Arm - Pain      HPI: Ms. Londo comes in today with a 4-week history of right muscular upper arm pain.  She said she got a flu shot in her arm about 4 weeks ago.  She thought it hurt more than it usually did.  Shortly after that she developed quite a bit of soft tissue swelling.  Denies any fever or chills.  It got worse over the following 2 weeks.  Now she complains of intermittent thumb numbness and electrical sensations going up and down her arm.  She has been seeing her primary care doctor.  She did get a steroid shot which only helped for short period of time.  She is currently on gabapentin 100 mg twice a day which she thinks has helped a little bit.  She is also been started on 800 mg of ibuprofen.  She does see a neurologist for Parkinson's disease that was recently diagnosed  Assessment & Plan: Visit Diagnoses:  1. Right arm pain   2. Pain of right upper extremity     Plan: Pain appears to be mostly neuropathic.  We discussed the natural history of this.  I do not see anything orthopedic or bony going on.  She has no evidence of any infection and I do not appreciate any cellulitis or erythema today.  She is neurovascularly intact distally both motor and sensation today.  I have told her she could go up on the gabapentin if this was okay with her neurologist.  Certainly if this persisted she could get nerve studies again which she could get through her neurologist.  May follow-up with as as needed also got spine x-rays do not think this is a cervical cause  Follow-Up Instructions: No follow-ups on file.   Ortho Exam  Patient is alert, oriented, no adenopathy,  well-dressed, normal affect, normal respiratory effort. Examination of her cervical spine she has good flexion extension side to side turning no reproduction of any radicular findings in the right arm.  Strength is 5/5 sensation currently is intact she has a strong radial pulse no sign of cellulitis or erythema or swelling in the right upper arm.  She does have some tenderness to palpation over her deltoid muscle  Imaging: XR Humerus Right  Result Date: 10/10/2022 2 views of her right humerus were obtained today.  Well-maintained alignment both distally and proximally no evidence of any type of fracture or other osseous abnormalities  No images are attached to the encounter.  Labs: Lab Results  Component Value Date   HGBA1C 5.6 10/24/2021   HGBA1C 5.7 (H) 10/16/2020   HGBA1C 5.8 (H) 07/04/2020   ESRSEDRATE 11 07/04/2020     Lab Results  Component Value Date   ALBUMIN 4.5 02/20/2017   ALBUMIN 4.5 09/24/2016   ALBUMIN 4.5 06/03/2016    Lab Results  Component Value Date   MG 1.9 04/23/2022   MG 2.0 10/24/2021   MG 2.2 10/16/2020   Lab Results  Component Value Date   VD25OH 86 10/24/2021  VD25OH 97 10/16/2020   VD25OH 87 07/04/2020    No results found for: "PREALBUMIN"    Latest Ref Rng & Units 04/23/2022    9:16 AM 10/24/2021    9:40 AM 03/14/2021   12:00 AM  CBC EXTENDED  WBC 3.8 - 10.8 Thousand/uL 6.8  6.6  4.7   RBC 3.80 - 5.10 Million/uL 4.25  4.24  4.03   Hemoglobin 11.7 - 15.5 g/dL 12.7  12.8  12.3   HCT 35.0 - 45.0 % 38.0  38.5  36.2   Platelets 140 - 400 Thousand/uL 219  216  205   NEUT# 1,500 - 7,800 cells/uL 4,508  4,627  2,703   Lymph# 850 - 3,900 cells/uL 1,639  1,399  1,495      There is no height or weight on file to calculate BMI.  Orders:  Orders Placed This Encounter  Procedures   XR Cervical Spine 2 or 3 views   XR Humerus Right   No orders of the defined types were placed in this encounter.    Procedures: No procedures  performed  Clinical Data: No additional findings.  ROS:  All other systems negative, except as noted in the HPI. Review of Systems  Objective: Vital Signs: LMP 06/10/2010   Specialty Comments:  No specialty comments available.  PMFS History: Patient Active Problem List   Diagnosis Date Noted   Arm pain 10/10/2022   Parkinson's disease 03/19/2021   History of adenomatous polyp of colon 02/14/2021   Dysfunction of right eustachian tube 08/07/2020   Constipation 02/09/2019   CKD Stage 2 (GFR 69 ml/min) 09/22/2014   Medication management 09/22/2014   Pre-diabetes 09/22/2014   Morbid Obesity (BMI 32.54) 09/22/2014   Breast cancer, Left 09/22/2014   GERD    Vitamin D deficiency    Fatty liver    Hypertension    Hyperlipidemia    Past Medical History:  Diagnosis Date   Acid reflux    Arthritis    Cancer ([place])    Left Breast   Complication of anesthesia    woke  up X 1   Fatty liver    High cholesterol    Hypertension    Spider bite 2016   Vitamin D deficiency     Family History  Problem Relation Age of Onset   Hypertension Mother    Hyperlipidemia Mother    Gout Father 11       trauma   Cancer Cousin        lung   Hyperlipidemia Brother    Hypertension Brother    COPD Sister     Past Surgical History:  Procedure Laterality Date   BREAST SURGERY  1995   mastectomy left   CHOLECYSTECTOMY  11/19/2012   Procedure: LAPAROSCOPIC CHOLECYSTECTOMY WITH INTRAOPERATIVE CHOLANGIOGRAM;  Surgeon: Zenovia Jarred, MD;  Location: [facility];  Service: General;  Laterality: N/A;   HERNIA REPAIR     KNEE ARTHROSCOPY  1982   MASTECTOMY Left 1995   TRAM  1999-2000   WISDOM TOOTH EXTRACTION     Social History   Occupational History   Not on file  Tobacco Use   Smoking status: Never   Smokeless tobacco: Never  Vaping Use   Vaping Use: Never used  Substance and Sexual Activity   Alcohol use: Yes    Alcohol/week: 1.0 standard drink of alcohol    Types: 1 Cans of beer  per week    Comment: rare   Drug use: No  Sexual activity: Not on file

## 2022-10-10 NOTE — Telephone Encounter (Signed)
Pt is wanting to increase frequency of gabapentin bc she says the 2x daily is not helping

## 2022-10-10 NOTE — Telephone Encounter (Signed)
She can take three times a day and let us know if it is helping

## 2022-10-12 ENCOUNTER — Other Ambulatory Visit: Payer: Self-pay | Admitting: Nurse Practitioner

## 2022-10-12 DIAGNOSIS — R200 Anesthesia of skin: Secondary | ICD-10-CM

## 2022-10-13 ENCOUNTER — Other Ambulatory Visit: Payer: Self-pay | Admitting: Nurse Practitioner

## 2022-10-13 DIAGNOSIS — R202 Paresthesia of skin: Secondary | ICD-10-CM

## 2022-10-22 NOTE — Progress Notes (Signed)
Patient ID: Jenna Miller, female   DOB: Jul 22, 1956, 66 y.o.   MRN: [id number]  COMPLETE PHYSICAL  Assessment and Plan:    [place] was seen today for annual exam.  Diagnoses and all orders for this visit:  Encounter for general adult medical examination with abnormal findings Due Annually  Essential hypertension Continue current medications: Monitor blood pressure at home; call if consistently over 130/80 Continue DASH diet.   Reminder to go to the ER if any CP, SOB, nausea, dizziness, severe HA, changes vision/speech, left arm numbness and tingling and jaw pain. -     CBC with Differential/Platelet -     COMPLETE METABOLIC PANEL WITH GFR -     Magnesium -     EKG 12-Lead  Hyperlipidemia Lifestyle diet controlled Discussed dietary and exercise modifications Low fat diet -     Lipid panel  Abnormal glucose Discussed dietary and exercise modifications -     Hemoglobin A1c  Vitamin D deficiency -     VITAMIN D 25 Hydroxy (Vit-D Deficiency, Fractures)  Morbid Obesity with comorbidity of Parkisnons, HLD(BMI 33.0)  Reviewed diet, exercise and weight control- educational material given to patient General weight loss/lifestyle modification strategies discussed (elicit support from others; identify saboteurs; non-food rewards, etc). Continue food diary Continue restricted calorie diet Continue daily exercise as well as behavior modification such as walking further away, putting down the fork, having a plan, using stairs, etc.    Pain in right arm with numbness and tingling Continue Gabapentin and monitor symptoms- have improved If persist pver the next 4 weeks will try to get earlier appt with Dr. Carles Collet- currently her next appt isn't until 12/2022  Gastroesophageal reflux disease, unspecified whether esophagitis present Doing well at this time Continue: famotidine 40mg  daily Diet discussed Monitor for triggers Avoid food with high acid content Avoid excessive  cafeine Increase water intake  Fatty liver Weight loss advised, avoid alcohol/tylenol, will monitor LFTs  CKD Stage 2 (GFR 69 ml/min) Increase fluids  Avoid NSAIDS Blood pressure control Monitor sugars  Will continue to monitor  Malignant neoplasm of left female breast, unspecified estrogen receptor status, unspecified site of breast ([place]) Monitored  Irritable bowel syndrome with constipation If not on benefiber then add it, decrease stress,  if any worsening symptoms, blood in stool, AB pain, etc call office  Medication management Continued  Acute pain of left shoulder Following with orthopedics Reports needing MRI  Vestibular Migraines Follows with ENT for this Recommended topamax daily Patient not taking at this time Symptoms returning Discussed follow up with ENT  Screening for thyroid disorder -     TSH  Parkinson's Disease Continue to follow with Dr. Carles Collet Continue Sinemet [phone]  Screening for blood or protein in urine -     Urinalysis w microscopic + reflex cultur -     Microalbumin / creatinine urine ratio  Screening for ischemic heart disease EKG  Screening for AAA ABD U/S Retroperitoneal LTD  Vitamin D deficiency -     VITAMIN D 25 Hydroxy (Vit-D Deficiency, Fractures) Continue Vit D supplementation  Malignant neoplasm of left female breast, unspecified estrogen receptor status, unspecified site of breast [facility]) monitored  Future Appointments  Date Time Provider [place]  11/04/2022  7:10 AM [facility] MM 3 [facility]  01/26/2023  8:15 AM Tat, Eustace Quail, DO [facility] None  10/30/2023  9:00 AM Alycia Rossetti, NP [facility] None     Continue diet and meds as discussed. Further disposition pending results  of labs.  HPI 66 y.o. female  presents for CPE and follow up on HTN, HLD, GERD, abnormal glucose / prediabetes, IBS-C, CKDII, weight and vitamin D.  She was diagnosed with Parkinson's in 02/2021 with Dr. Carles Collet. On Sinemet IR 1 tab  TID. Last visit with Dr. Carles Collet was 05/2022 Symptoms are currently controlled. Tremor is controlled.  She got a flu shot and had numbness and tingling/electrical sensations of her right arm within 3 days of her shot on 09/02/22.  The symptoms are slightly improving   Her blood pressure has been controlled at home, today their BP is BP: 120/70.   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   She denies chest pain, shortness of breath, dizziness.  BMI is Body mass index is 33.07 kg/m., she is working on diet and exercise. She is walking 6000-8000 steps a day. She has been eating a little more.She recently had a steroid shot Wt Readings from Last 3 Encounters:  10/24/22 180 lb 12.8 oz (82 kg)  09/23/22 177 lb 12.8 oz (80.6 kg)  07/24/22 176 lb (79.8 kg)   She has hypertensive CKD stage 2, she is on HCTZ Lab Results  Component Value Date   EGFR 73 04/23/2022    She has IBS-C was using linzess with good success until she developed diarrhea that sometimes caused incontinence.  She is still having round pellets. She is using stool softener daily and once every other week she may use a laxative.   She is on cholesterol medication, zetia and denies myalgias. Her cholesterol is at goal. The cholesterol last visit was:   Lab Results  Component Value Date   CHOL 202 (H) 04/23/2022   HDL 69 04/23/2022   LDLCALC 114 (H) 04/23/2022   TRIG 88 04/23/2022   CHOLHDL 2.9 04/23/2022    She has been working on diet and exercise for prediabetes, and denies foot ulcerations, hyperglycemia, hypoglycemia , increased appetite, nausea, paresthesia of the feet, polydipsia, polyuria, visual disturbances, vomiting and weight loss. Last A1C in the office was:  Lab Results  Component Value Date   HGBA1C 5.6 10/24/2021    Patient is on Vitamin D supplement, 8000 a day.   Lab Results  Component Value Date   VD25OH 86 10/24/2021    Current Medications:  Current Outpatient Medications on File Prior to Visit  Medication Sig Dispense Refill   BIOTIN 5000 PO Take by mouth.     carbidopa-levodopa (SINEMET IR) 25-100 MG tablet Take 1 tablet by mouth 3 (three) times daily. 270 tablet 2   Cholecalciferol (VITAMIN D3 PO) Take 5,000 mg by mouth daily.     ezetimibe (ZETIA) 10 MG tablet TAKE 1 TABLET BY MOUTH EVERY DAY FOR CHOLESTEROL 90 tablet 3   famotidine (PEPCID) 40 MG tablet TAKE 1 TABLET 2 X /DAY TO PREVENT HEARTBURN & INDIGESTION 180 tablet 3   fluticasone (FLONASE) 50 MCG/ACT nasal spray SPRAY 2 SPRAYS INTO EACH NOSTRIL EVERY DAY 48 mL 1   gabapentin (NEURONTIN) 100 MG capsule Take 1 capsule (100 mg total) by mouth 3 (three) times daily. 90 capsule 0   ibuprofen (IBU) 800 MG tablet Take 1 tablet (800 mg total) by mouth daily as needed. 14 tablet 0   Methylcobalamin (B12-ACTIVE) 1 MG CHEW Chew  1 each by mouth daily.     Zinc Sulfate (ZINC 15 PO) Take by mouth.     No current facility-administered medications on file prior to visit.    Medical History:  Past Medical History:  Diagnosis Date   Acid reflux    Arthritis    Cancer ([place])    Left Breast   Complication of anesthesia    woke  up X 1   Fatty liver    High cholesterol    Hypertension    Spider bite 2016   Vitamin D deficiency    Immunization History  Administered Date(s) Administered   Influenza Inj Mdck Quad Pf 11/12/2019   Influenza Inj Mdck Quad With Preservative 10/06/2018, 10/13/2019   Influenza Split 08/29/2013, 10/16/2015   Influenza, High Dose Seasonal PF 09/29/2017, 10/16/2020, 10/24/2021   Influenza, Seasonal, Injecte, Preservative Fre 11/02/2014   Influenza,inj,quad, With Preservative 09/24/2016   Influenza-Unspecified 09/10/2022   PFIZER(Purple Top)SARS-COV-2 Vaccination 03/10/2020, 03/31/2020   PNEUMOCOCCAL CONJUGATE-20  10/24/2021   PPD Test 03/14/2014, 07/11/2015   Pneumococcal Polysaccharide-23 05/22/2003   Td 05/28/2005   Tdap 07/11/2015   Zoster Recombinat (Shingrix) 11/20/2021, 02/12/2022   Zoster, Live 02/26/2016   Health Maintenance  Topic Date Due   DEXA SCAN  03/06/2020   COVID-19 Vaccine (3 - Pfizer risk series) 04/28/2020   COLONOSCOPY (Pts 45-58yrs Insurance coverage will need to be confirmed)  10/06/2021   MAMMOGRAM  10/31/2023   TETANUS/TDAP  07/10/2025   Pneumonia Vaccine 18+ Years old  Completed   INFLUENZA VACCINE  Completed   Hepatitis C Screening  Completed   Zoster Vaccines- Shingrix  Completed   HPV VACCINES  Aged Out     Patient Care Team: Unk Pinto, MD as PCP - General (Internal Medicine) Unk Pinto, MD as Referring Physician (Internal Medicine) Tat, Eustace Quail, DO as Consulting Physician (Neurology)  Allergies Allergies  Allergen Reactions   Lipitor [Atorvastatin] Anaphylaxis and Swelling    Other reaction(s): Other (See Comments) Unknown   Effexor [Venlafaxine]     Other reaction(s): Other (See Comments)   Paxil [Paroxetine Hcl]     Other reaction(s): Other (See Comments)   Wellbutrin [Bupropion] Hives    SURGICAL HISTORY She  has a past surgical history that includes TRAM (1999-2000); Knee arthroscopy (1982); Wisdom tooth extraction; Cholecystectomy (11/19/2012); Breast surgery (1995); Hernia repair; and Mastectomy (Left, 1995). FAMILY HISTORY Her family history includes COPD in her sister; Cancer in her cousin; Gout (age of onset: 39) in her father; Hyperlipidemia in her brother and mother; Hypertension in her brother and mother. SOCIAL HISTORY She  reports that she has never smoked. She has never used smokeless tobacco. She reports current alcohol use of about 1.0 standard drink of alcohol per week. She reports that she does not use drugs.  Review of Systems:  Review of Systems  Constitutional:  Negative for chills, fever and malaise/fatigue.   HENT:  Negative for congestion, ear pain, hearing loss, sinus pain, sore throat and tinnitus.   Eyes:  Negative for blurred vision and double vision.  Respiratory:  Negative for cough, hemoptysis, sputum production, shortness of breath and wheezing.   Cardiovascular:  Negative for chest pain, palpitations and leg swelling.  Gastrointestinal:  Positive for constipation. Negative for abdominal pain, blood in stool, diarrhea, heartburn, melena, nausea and vomiting.  Genitourinary: Negative.  Negative for dysuria and urgency.  Musculoskeletal:  Negative for back pain, falls, joint pain, myalgias and neck pain.  Skin:  Negative for rash.  Neurological:  Positive  for tingling (right arm) and headaches (irregular doesnt take medication). Negative for dizziness, tremors, sensory change, loss of consciousness and weakness.       She does have parkinsons  Endo/Heme/Allergies:  Does not bruise/bleed easily.  Psychiatric/Behavioral:  Negative for depression and suicidal ideas. The patient has insomnia (difficulty staying asleep). The patient is not nervous/anxious.     Physical Exam: BP 120/70   Pulse 80   Temp 97.9 F (36.6 C)   Resp 16   Ht $R'5\' 2"'fh$  (1.575 m)   Wt 180 lb 12.8 oz (82 kg)   LMP 06/10/2010   SpO2 98%   BMI 33.07 kg/m  Wt Readings from Last 3 Encounters:  10/24/22 180 lb 12.8 oz (82 kg)  09/23/22 177 lb 12.8 oz (80.6 kg)  07/24/22 176 lb (79.8 kg)    General Appearance: Well nourished well developed, in no apparent distress. Eyes: PERRLA, EOMs, conjunctiva no swelling or erythema ENT/Mouth: Ear canals normal without obstruction, swelling, erythma, discharge.  TMs normal bilaterally.  Oropharynx moist, clear, without exudate, or postoropharyngeal swelling. Neck: Supple, thyroid normal,no cervical adenopathy  Respiratory: Respiratory effort normal, Breath sounds clear A&P without rhonchi, wheeze, or rale.  No retractions, no accessory usage. Cardio: RRR with no MRGs. Brisk  peripheral pulses without edema.  Abdomen: Soft, + BS,  Non tender, no guarding, rebound, hernias, masses. Musculoskeletal: Full ROM, 5/5 strength, Normal gait. LUE tremor.  Skin: Warm, dry without rashes, lesions, ecchymosis.  Neuro: Awake and oriented X 3, Cranial nerves intact. Normal muscle tone, no cerebellar symptoms. Psych: Normal affect, Insight and Judgment appropriate.   EKG: NSR, no ST changes AAA: < 3 cm  [facility]  10/24/2022

## 2022-10-24 ENCOUNTER — Ambulatory Visit (INDEPENDENT_AMBULATORY_CARE_PROVIDER_SITE_OTHER): Payer: BC Managed Care – PPO | Admitting: Nurse Practitioner

## 2022-10-24 ENCOUNTER — Encounter: Payer: Self-pay | Admitting: Nurse Practitioner

## 2022-10-24 VITALS — BP 120/70 | HR 80 | Temp 97.9°F | Resp 16 | Ht 62.0 in | Wt 180.8 lb

## 2022-10-24 DIAGNOSIS — Z79899 Other long term (current) drug therapy: Secondary | ICD-10-CM

## 2022-10-24 DIAGNOSIS — Z1389 Encounter for screening for other disorder: Secondary | ICD-10-CM | POA: Diagnosis not present

## 2022-10-24 DIAGNOSIS — Z1329 Encounter for screening for other suspected endocrine disorder: Secondary | ICD-10-CM

## 2022-10-24 DIAGNOSIS — R200 Anesthesia of skin: Secondary | ICD-10-CM

## 2022-10-24 DIAGNOSIS — Z0000 Encounter for general adult medical examination without abnormal findings: Secondary | ICD-10-CM | POA: Diagnosis not present

## 2022-10-24 DIAGNOSIS — Z13220 Encounter for screening for lipoid disorders: Secondary | ICD-10-CM

## 2022-10-24 DIAGNOSIS — Z131 Encounter for screening for diabetes mellitus: Secondary | ICD-10-CM

## 2022-10-24 DIAGNOSIS — M79601 Pain in right arm: Secondary | ICD-10-CM

## 2022-10-24 DIAGNOSIS — I700 Atherosclerosis of aorta: Secondary | ICD-10-CM | POA: Diagnosis not present

## 2022-10-24 DIAGNOSIS — I10 Essential (primary) hypertension: Secondary | ICD-10-CM

## 2022-10-24 DIAGNOSIS — R29818 Other symptoms and signs involving the nervous system: Secondary | ICD-10-CM

## 2022-10-24 DIAGNOSIS — K581 Irritable bowel syndrome with constipation: Secondary | ICD-10-CM

## 2022-10-24 DIAGNOSIS — Z136 Encounter for screening for cardiovascular disorders: Secondary | ICD-10-CM | POA: Diagnosis not present

## 2022-10-24 DIAGNOSIS — E559 Vitamin D deficiency, unspecified: Secondary | ICD-10-CM | POA: Diagnosis not present

## 2022-10-24 DIAGNOSIS — K760 Fatty (change of) liver, not elsewhere classified: Secondary | ICD-10-CM

## 2022-10-24 DIAGNOSIS — C50912 Malignant neoplasm of unspecified site of left female breast: Secondary | ICD-10-CM

## 2022-10-24 DIAGNOSIS — G43809 Other migraine, not intractable, without status migrainosus: Secondary | ICD-10-CM

## 2022-10-24 DIAGNOSIS — Z0001 Encounter for general adult medical examination with abnormal findings: Secondary | ICD-10-CM

## 2022-10-24 DIAGNOSIS — N182 Chronic kidney disease, stage 2 (mild): Secondary | ICD-10-CM

## 2022-10-24 DIAGNOSIS — E782 Mixed hyperlipidemia: Secondary | ICD-10-CM

## 2022-10-24 DIAGNOSIS — R7309 Other abnormal glucose: Secondary | ICD-10-CM

## 2022-10-24 NOTE — Patient Instructions (Signed)

## 2022-10-25 LAB — CBC WITH DIFFERENTIAL/PLATELET
Absolute Monocytes: 369 cells/uL (ref 200–950)
Basophils Absolute: 43 cells/uL (ref 0–200)
Basophils Relative: 0.6 %
Eosinophils Absolute: 71 cells/uL (ref 15–500)
Eosinophils Relative: 1 %
HCT: 36.2 % (ref 35.0–45.0)
Hemoglobin: 12.2 g/dL (ref 11.7–15.5)
Lymphs Abs: 1441 cells/uL (ref 850–3900)
MCH: 30.5 pg (ref 27.0–33.0)
MCHC: 33.7 g/dL (ref 32.0–36.0)
MCV: 90.5 fL (ref 80.0–100.0)
MPV: 10.8 fL (ref 7.5–12.5)
Monocytes Relative: 5.2 %
Neutro Abs: 5176 cells/uL (ref 1500–7800)
Neutrophils Relative %: 72.9 %
Platelets: 219 10*3/uL (ref 140–400)
RBC: 4 10*6/uL (ref 3.80–5.10)
RDW: 12.8 % (ref 11.0–15.0)
Total Lymphocyte: 20.3 %
WBC: 7.1 10*3/uL (ref 3.8–10.8)

## 2022-10-25 LAB — MICROALBUMIN / CREATININE URINE RATIO
Creatinine, Urine: 62 mg/dL (ref 20–275)
Microalb Creat Ratio: 5 mcg/mg creat (ref <30)
Microalb, Ur: 0.3 mg/dL

## 2022-10-25 LAB — URINALYSIS, ROUTINE W REFLEX MICROSCOPIC
Bacteria, UA: NONE SEEN /HPF
Bilirubin Urine: NEGATIVE
Glucose, UA: NEGATIVE
Hgb urine dipstick: NEGATIVE
Hyaline Cast: NONE SEEN /LPF
Ketones, ur: NEGATIVE
Nitrite: NEGATIVE
Protein, ur: NEGATIVE
RBC / HPF: NONE SEEN /HPF (ref 0–2)
Specific Gravity, Urine: 1.01 (ref 1.001–1.035)
Squamous Epithelial / HPF: NONE SEEN /HPF (ref <=5)
pH: 5.5 (ref 5.0–8.0)

## 2022-10-25 LAB — COMPLETE METABOLIC PANEL WITH GFR
AG Ratio: 2 (calc) (ref 1.0–2.5)
ALT: 7 U/L (ref 6–29)
AST: 17 U/L (ref 10–35)
Albumin: 4.6 g/dL (ref 3.6–5.1)
Alkaline phosphatase (APISO): 53 U/L (ref 37–153)
BUN: 14 mg/dL (ref 7–25)
CO2: 28 mmol/L (ref 20–32)
Calcium: 9.9 mg/dL (ref 8.6–10.4)
Chloride: 103 mmol/L (ref 98–110)
Creat: 0.99 mg/dL (ref 0.50–1.05)
Globulin: 2.3 g/dL (calc) (ref 1.9–3.7)
Glucose, Bld: 98 mg/dL (ref 65–99)
Potassium: 4.8 mmol/L (ref 3.5–5.3)
Sodium: 141 mmol/L (ref 135–146)
Total Bilirubin: 0.4 mg/dL (ref 0.2–1.2)
Total Protein: 6.9 g/dL (ref 6.1–8.1)
eGFR: 62 mL/min/{1.73_m2} (ref >=60)

## 2022-10-25 LAB — LIPID PANEL
Cholesterol: 215 mg/dL — ABNORMAL HIGH (ref <200)
HDL: 71 mg/dL (ref >=50)
LDL Cholesterol (Calc): 124 mg/dL (calc) — ABNORMAL HIGH
Non-HDL Cholesterol (Calc): 144 mg/dL (calc) — ABNORMAL HIGH (ref <130)
Total CHOL/HDL Ratio: 3 (calc) (ref <5.0)
Triglycerides: 97 mg/dL (ref <150)

## 2022-10-25 LAB — VITAMIN D 25 HYDROXY (VIT D DEFICIENCY, FRACTURES): Vit D, 25-Hydroxy: 77 ng/mL (ref 30–100)

## 2022-10-25 LAB — MAGNESIUM: Magnesium: 2 mg/dL (ref 1.5–2.5)

## 2022-10-25 LAB — MICROSCOPIC MESSAGE

## 2022-10-25 LAB — HEMOGLOBIN A1C
Hgb A1c MFr Bld: 5.8 % of total Hgb — ABNORMAL HIGH (ref <5.7)
Mean Plasma Glucose: 120 mg/dL
eAG (mmol/L): 6.6 mmol/L

## 2022-10-25 LAB — TSH: TSH: 1.19 mIU/L (ref 0.40–4.50)

## 2022-11-04 ENCOUNTER — Ambulatory Visit
Admission: RE | Admit: 2022-11-04 | Discharge: 2022-11-04 | Disposition: A | Discharge status: Home / Self Care | Payer: BC Managed Care – PPO | Source: Ambulatory Visit | Attending: Internal Medicine | Admitting: Internal Medicine

## 2022-11-04 DIAGNOSIS — Z1231 Encounter for screening mammogram for malignant neoplasm of breast: Secondary | ICD-10-CM

## 2022-11-25 DIAGNOSIS — R490 Dysphonia: Secondary | ICD-10-CM | POA: Diagnosis not present

## 2022-11-25 DIAGNOSIS — K2100 Gastro-esophageal reflux disease with esophagitis, without bleeding: Secondary | ICD-10-CM | POA: Diagnosis not present

## 2022-11-25 DIAGNOSIS — R0602 Shortness of breath: Secondary | ICD-10-CM | POA: Diagnosis not present

## 2022-11-25 DIAGNOSIS — R059 Cough, unspecified: Secondary | ICD-10-CM | POA: Diagnosis not present

## 2022-11-25 NOTE — Progress Notes (Deleted)
Assessment and Plan:  There are no diagnoses linked to this encounter.    Further disposition pending results of labs. Discussed med's effects and SE's.   Over 30 minutes of exam, counseling, chart review, and critical decision making was performed.   Future Appointments  Date Time Provider [place]  11/26/2022  9:30 AM Alycia Rossetti, NP [facility] None  01/26/2023  8:15 AM Tat, Eustace Quail, DO [facility] None  04/28/2023  9:30 AM Alycia Rossetti, NP [facility] None  10/30/2023  9:00 AM Alycia Rossetti, NP [facility] None    ------------------------------------------------------------------------------------------------------------------   HPI LMP 06/10/2010  66 y.o.female presents for  Past Medical History:  Diagnosis Date   Acid reflux    Arthritis    Cancer ([place])    Left Breast   Complication of anesthesia    woke  up X 1   Fatty liver    High cholesterol    Hypertension    Spider bite 2016   Vitamin D deficiency      Allergies  Allergen Reactions   Lipitor [Atorvastatin] Anaphylaxis and Swelling    Other reaction(s): Other (See Comments) Unknown   Effexor [Venlafaxine]     Other reaction(s): Other (See Comments)   Paxil [Paroxetine Hcl]     Other reaction(s): Other (See Comments)   Wellbutrin [Bupropion] Hives    Current Outpatient Medications on File Prior to Visit  Medication Sig   BIOTIN 5000 PO Take by mouth.   carbidopa-levodopa (SINEMET IR) 25-100 MG tablet Take 1 tablet by mouth 3 (three) times daily.   Cholecalciferol (VITAMIN D3 PO) Take 5,000 mg by mouth daily.   ezetimibe (ZETIA) 10 MG tablet TAKE 1 TABLET BY MOUTH EVERY DAY FOR CHOLESTEROL   famotidine (PEPCID) 40 MG tablet TAKE 1 TABLET 2 X /DAY TO PREVENT HEARTBURN & INDIGESTION   fluticasone (FLONASE) 50 MCG/ACT nasal spray SPRAY 2 SPRAYS INTO EACH NOSTRIL EVERY DAY   gabapentin (NEURONTIN) 100 MG capsule Take 1 capsule (100 mg total) by mouth 3 (three) times daily.   ibuprofen  (IBU) 800 MG tablet Take 1 tablet (800 mg total) by mouth daily as needed.   Methylcobalamin (B12-ACTIVE) 1 MG CHEW Chew 1 each by mouth daily.   Zinc Sulfate (ZINC 15 PO) Take by mouth.   No current facility-administered medications on file prior to visit.    ROS: all negative except above.   Physical Exam:  LMP 06/10/2010   General Appearance: Well nourished, in no apparent distress. Eyes: PERRLA, EOMs, conjunctiva no swelling or erythema Sinuses: No Frontal/maxillary tenderness ENT/Mouth: Ext aud canals clear, TMs without erythema, bulging. No erythema, swelling, or exudate on post pharynx.  Tonsils not swollen or erythematous. Hearing normal.  Neck: Supple, thyroid normal.  Respiratory: Respiratory effort normal, BS equal bilaterally without rales, rhonchi, wheezing or stridor.  Cardio: RRR with no MRGs. Brisk peripheral pulses without edema.  Abdomen: Soft, + BS.  Non tender, no guarding, rebound, hernias, masses. Lymphatics: Non tender without lymphadenopathy.  Musculoskeletal: Full ROM, 5/5 strength, normal gait.  Skin: Warm, dry without rashes, lesions, ecchymosis.  Neuro: Cranial nerves intact. Normal muscle tone, no cerebellar symptoms. Sensation intact.  Psych: Awake and oriented X 3, normal affect, Insight and Judgment appropriate.     Alycia Rossetti, NP 12:38 PM [facility]

## 2022-11-26 ENCOUNTER — Ambulatory Visit: Payer: BC Managed Care – PPO | Admitting: Nurse Practitioner

## 2023-01-22 NOTE — Progress Notes (Signed)
Assessment/Plan:   1.  Parkinsons Disease, diagnosed March, 2022  -continue carbidopa/levodopa 25/100, 1 at 7am/11am/4pm.   -mild Parkinsons Disease dyskinesia - not bothersome to patient  -discussed exercise and impact of exercise  -We discussed that it used to be thought that levodopa would increase risk of melanoma but now it is believed that Parkinsons itself likely increases risk of melanoma. she is to get regular skin checks.  She is doing that.  She has a f/u in April   Subjective:   Jenna Miller was seen today in follow up for newly diagnosed Parkinsons disease.  My previous records were reviewed prior to todays visit as well as outside records available to me.  She is doing well from a Parkinson's standpoint.  She has had no lightheadedness or near syncope.  She has had no falls.  Back in the fall, she got a flu shot and had significant pain and swelling around the site.  She went to her nurse practitioner and they gave her some gabapentin, but it did not seem to resolve the pain.  She was taking prescription strength ibuprofen.  She went to orthopedics and saw the PA and they told her that it was a neuropathic issue and that she could go up on the gabapentin or get nerve conduction studies at our office.  I was unaware of this at the time.  It got better eventually.  She is walking about 4,000 steps per day but hoping to increase that.  Current prescribed movement disorder medications: Carbidopa/levodopa 25/100, 1 tablet 3 times per day    PREVIOUS MEDICATIONS: Sinemet  ALLERGIES:   Allergies  Allergen Reactions   Lipitor [Atorvastatin] Anaphylaxis and Swelling    Other reaction(s): Other (See Comments) Unknown   Effexor [Venlafaxine]     Other reaction(s): Other (See Comments)   Paxil [Paroxetine Hcl]     Other reaction(s): Other (See Comments)   Wellbutrin [Bupropion] Hives    CURRENT MEDICATIONS:  Outpatient Encounter Medications as of 01/26/2023  Medication  Sig   BIOTIN 5000 PO Take by mouth.   carbidopa-levodopa (SINEMET IR) 25-100 MG tablet Take 1 tablet by mouth 3 (three) times daily.   Cholecalciferol (VITAMIN D3 PO) Take 5,000 mg by mouth daily.   ezetimibe (ZETIA) 10 MG tablet TAKE 1 TABLET BY MOUTH EVERY DAY FOR CHOLESTEROL   famotidine (PEPCID) 40 MG tablet TAKE 1 TABLET 2 X /DAY TO PREVENT HEARTBURN & INDIGESTION   fluticasone (FLONASE) 50 MCG/ACT nasal spray SPRAY 2 SPRAYS INTO EACH NOSTRIL EVERY DAY (Patient taking differently: as needed. SPRAY 2 SPRAYS INTO EACH NOSTRIL EVERY DAY)   gabapentin (NEURONTIN) 100 MG capsule Take 1 capsule (100 mg total) by mouth 3 (three) times daily. (Patient taking differently: Take 100 mg by mouth as needed.)   ibuprofen (IBU) 800 MG tablet Take 1 tablet (800 mg total) by mouth daily as needed.   Methylcobalamin (B12-ACTIVE) 1 MG CHEW Chew 1 each by mouth daily.   Zinc Sulfate (ZINC 15 PO) Take by mouth.   No facility-administered encounter medications on file as of 01/26/2023.    Objective:   PHYSICAL EXAMINATION:    VITALS:   Vitals:   01/26/23 0742  BP: 137/74  Pulse: 77  SpO2: 98%  Weight: 176 lb 6.4 oz (80 kg)  Height: '5\' 2"'$  (1.575 m)    GEN:  The patient appears stated age and is in NAD. HEENT:  Normocephalic, atraumatic.  The mucous membranes are moist. The superficial temporal  arteries are without ropiness or tenderness. CV:  RRR Lungs:  CTAB Neck/HEME:  There are no carotid bruits bilaterally.  Neurological examination:  Orientation: The patient is alert and oriented x3. Cranial nerves: There is good facial symmetry with facial hypomimia. The speech is fluent and clear. Soft palate rises symmetrically and there is no tongue deviation. Hearing is intact to conversational tone. Sensation: Sensation is intact to light touch throughout Motor: Strength is at least antigravity x4.  Movement examination: Tone: There is minimal increased tone in the LUE (same as last  visit) Abnormal movements:no tremor; mild dyskinesia on the L leg Coordination:  There is mild decremation with RAM's, with any form of RAMS, including alternating supination and pronation of the forearm, hand opening and closing, finger taps, heel taps and toe taps, on the L (same as last visit) Gait and Station: The patient has no difficulty arising out of a deep-seated chair without the use of the hands. The patient's stride length is good with decreased arm swing bilaterally  I have reviewed and interpreted the following labs independently    Chemistry      Component Value Date/Time   NA 141 10/24/2022 0000   K 4.8 10/24/2022 0000   CL 103 10/24/2022 0000   CO2 28 10/24/2022 0000   BUN 14 10/24/2022 0000   CREATININE 0.99 10/24/2022 0000      Component Value Date/Time   CALCIUM 9.9 10/24/2022 0000   ALKPHOS 52 02/20/2017 0934   AST 17 10/24/2022 0000   ALT 7 10/24/2022 0000   BILITOT 0.4 10/24/2022 0000       Lab Results  Component Value Date   WBC 7.1 10/24/2022   HGB 12.2 10/24/2022   HCT 36.2 10/24/2022   MCV 90.5 10/24/2022   PLT 219 10/24/2022    Lab Results  Component Value Date   TSH 1.19 10/24/2022       Cc:  Unk Pinto, MD

## 2023-01-26 ENCOUNTER — Ambulatory Visit: Payer: BC Managed Care – PPO | Admitting: Neurology

## 2023-01-26 ENCOUNTER — Encounter: Payer: Self-pay | Admitting: Neurology

## 2023-01-26 VITALS — BP 137/74 | HR 77 | Ht 62.0 in | Wt 176.4 lb

## 2023-01-26 DIAGNOSIS — G20B1 Parkinson's disease with dyskinesia, without mention of fluctuations: Secondary | ICD-10-CM | POA: Diagnosis not present

## 2023-01-26 MED ORDER — CARBIDOPA-LEVODOPA 25-100 MG PO TABS: 1.0000 | ORAL_TABLET | Freq: Three times a day (TID) | ORAL | 1 refills | Status: DC

## 2023-01-26 NOTE — Patient Instructions (Signed)
Local and Online Resources for Power over Parkinson's Group  January 2024   LOCAL [place] PARKINSON'S GROUPS   Power over Parkinson's Group:    Power Over Parkinson's Patient Education Group will be Wednesday, January 10th-*Hybrid meting*- in person at [facility] location and via [facility], 2:00-3:00 pm.   Starting in November, Power over [employer] and Care Partner Groups will meet together, with plans for separate break out session for caregivers (*this will be evolving over the next few months) Upcoming Power over Parkinson's Meetings/Care Partner Support:  2nd Wednesdays of the month at 2 pm:   January 10th, February 14th Maries at [email or web address] if interested in participating in this group    [place]! Moves [employer] Instructor-Led Classes offering at [employer]!  TUESDAYS and Wednesdays 1-2 pm.   Contact Vonna Kotyk at  [email or web address]  or [phone] (Tuesday classes are modified for chair and standing only) Drumming for Parkinson's will be held on 2nd and 4th Mondays at 11:00 am.   Located at the [place] ([place]  Contact Doylene Canning at [email or web address] or Emery Class, Mondays at 11 am.  Call [phone] for details Let's Try Pickleball-$25 for 6 weeks of Pickleball in [place].  Contact Corwin Levins for more details and for dates.  [email or web address] SAVE THE DATE and REGISTER:  [place] Chapter of Parkinson's Foundation:  Parkinson's Symposium.  Conversations about Parkinson's.  Saturday, February 28, 2023, 9:00 am-2:00 pm.  [place], *In person or online via [place]*.  Register at [email or web address] or call Beverlee Nims at [phone].   [place]:  [email or web address]  PD Health at Home continues:   Mindfulness Mondays, Wellness Wednesdays, Fitness Fridays   Upcoming Education:    Environmental health practitioner your Voice:  Air cabin crew. Wednesday, January 3rd,  1-2 pm  Managing Weight Loss & Retaining Muscle Mass.  Wednesday, Jan 10th, 1-2 pm Changes in Speech and Voice.  Wednesday, January 17th, 1-2 pm Register for virtual education and Patent attorney (webinars) at [email or web address].pl Please check out their website to sign up for emails and see their full online offerings      [place]:  [email or web address]   Third Thursday Webinars:  On the third Thursday of every month at 12 p.m. ET, join our free live webinars to learn about various aspects of living with Parkinson's disease and our work to speed medical breakthroughs.  Upcoming Webinar:  New Year, New Moves!  Explore Exercise for Life with Parkinson's.  Thursday, January 18th at 12 noon. Check out additional information on their website to see their full online offerings    [facility]:  [email or web address]  Upcoming Webinar:   Physical Therapy and Parkinson's.  Thursday, January 11th, 2 pm  Webinar Series:  Living with Parkinson's Meetup.   Third Thursdays each month, 3 pm  Care Partner Monthly Meetup.  With Robin Searing Phinney.  First Tuesday of each month, 2 pm  Check out additional information to Live Well Today on their website    Parkinson and Movement Disorders (PMD) Alliance:  www.[email or web address]  [employer] Online:  Online Education Events  Sign up for emails, which are sent weekly to give you updates on programming and online offerings    Parkinson's Association of the [place]:  www.[email or web address]  Information on online support groups, education events, and online exercises including Yoga, Parkinson's exercises  and more-LOTS of information on links to PD resources and online events  Virtual Support Group through  Parkinson's Association of the [place]; next one is scheduled for Wednesday, Feb 7th  MOVEMENT AND EXERCISE OPPORTUNITIES  PWR! Moves Classes at [place].  Wednesdays 10 and 11 am.   Contact Amy Marriott, PT [email or web address] if interested.  NEW PWR! Moves Class offerings at [employer].  *TUESDAYS* and Wednesdays 1-2 pm.    Contact Vonna Kotyk at  [email or web address]    Parkinson's Wellness Recovery (PWR! Moves)  www.[email or web address]  Info on the PWR! Virtual Experience:  You will have access to our expertise?through self-assessment, guided plans that start with the PD-specific fundamentals, educational content, tips, Q&A with an expert, and a growing Art therapist of PD-specific pre-recorded and live exercise classes of varying types and intensity - both physical and cognitive! If that is not enough, we offer 1:1 wellness consultations (in-person or virtual) to personalize your PWR! Research scientist (medical).   [place] Fridays:   As part of the PD Health @ Home program, this free video series focuses each week on one aspect of fitness designed to support people living with Parkinson's.? These weekly videos highlight the [place] fitness guidelines for people with Parkinson's disease.  ModemGamers.[email or web address]   Dance for PD website is offering free, live-stream classes throughout the week, as well as links to [employer] of classes:  [email or web address]  Virtual dance and Pilates for Parkinson's classes: Click on the Community Tab> Parkinson's Movement Initiative Tab.  To register for classes and for more information, visit [email or web address] and click the "community" tab.   [employer] Parkinson's Cycling Classes   [employer]:  Thursdays @ Noon-Live classes at [employer] ([employer] at [email or web address]?or [phone])  [employer]: Virtual  Classes Mondays and Thursdays Jeanette Caprice classes Tuesday, Wednesday and Thursday (contact [place] at [email or web address] ?or [phone])  [place]  Varied levels of classes are offered Tuesdays and Thursdays at [employer].   Stretching with Verdis Frederickson weekly class is also offered for people with Parkinson's  To observe a class or for more information, call [phone] or email Hezzie Bump at [email or web address]   ADDITIONAL SUPPORT AND RESOURCES  Well[employer]:Online Caregiver Education Opportunities:  www.well-[email or web address]/caregiver-education/[email or web address]-support-group.  You may also contact Vickki Muff at [email or web address] or [phone].     Well-Spring Navigator:  Just1Navigator program, a?free service to help individuals and families through the journey of determining care for older adults.  The "Navigator" is a [phone], Education officer, museum, who will speak with a prospective client and/or loved ones to provide an assessment of the situation and a set of recommendations for a personalized care plan -- all free of charge, and whether?Well[employer]Spring [employer] offers the needed service or not. If the need is not a service we provide, we are well-connected with reputable programs in town that we can refer you to.  www.well-springsolutions.org or to speak with the Navigator, call [phone].

## 2023-02-11 ENCOUNTER — Other Ambulatory Visit: Payer: Self-pay | Admitting: Nurse Practitioner

## 2023-02-11 DIAGNOSIS — R202 Paresthesia of skin: Secondary | ICD-10-CM

## 2023-03-10 DIAGNOSIS — Z01419 Encounter for gynecological examination (general) (routine) without abnormal findings: Secondary | ICD-10-CM | POA: Diagnosis not present

## 2023-03-10 DIAGNOSIS — N390 Urinary tract infection, site not specified: Secondary | ICD-10-CM | POA: Diagnosis not present

## 2023-03-10 DIAGNOSIS — Z853 Personal history of malignant neoplasm of breast: Secondary | ICD-10-CM | POA: Diagnosis not present

## 2023-03-10 DIAGNOSIS — Z803 Family history of malignant neoplasm of breast: Secondary | ICD-10-CM | POA: Diagnosis not present

## 2023-03-10 DIAGNOSIS — Z6833 Body mass index (BMI) 33.0-33.9, adult: Secondary | ICD-10-CM | POA: Diagnosis not present

## 2023-03-10 DIAGNOSIS — Z86010 Personal history of colonic polyps: Secondary | ICD-10-CM | POA: Diagnosis not present

## 2023-04-27 NOTE — Progress Notes (Unsigned)
Patient ID: Jenna Miller, female   DOB: 09-25-1956, 67 y.o.   MRN: [id number]  6 MONTH FOLLOW UP  Assessment and Plan:  Jenna Miller was seen today for annual exam.  Diagnoses and all orders for this visit:   Essential hypertension Continue current medications: Monitor blood pressure at home; call if consistently over 130/80 Continue DASH diet.   Reminder to go to the ER if any CP, SOB, nausea, dizziness, severe HA, changes vision/speech, left arm numbness and tingling and jaw pain. -     CBC with Differential/Platelet -     COMPLETE METABOLIC PANEL WITH GFR  Hyperlipidemia Lifestyle diet controlled Discussed dietary and exercise modifications Low fat diet -     Lipid panel  Abnormal glucose Discussed dietary and exercise modifications -     CMP  Vitamin D deficiency Continue Vit D supplementation to maintain value in therapeutic level of 60-100   Obesity (BMI 32.37)  Reviewed diet, exercise and weight control- educational material given to patient General weight loss/lifestyle modification strategies discussed (elicit support from others; identify saboteurs; non-food rewards, etc). Continue food diary Continue restricted calorie diet Continue daily exercise as well as behavior modification such as walking further away, putting down the fork, having a plan, using stairs, etc.    Gastroesophageal reflux disease, unspecified whether esophagitis present Doing well at this time Continue: famotidine 40mg  daily Diet discussed Monitor for triggers Avoid food with high acid content Avoid excessive cafeine Increase water intake -Magnesium  Fatty liver Weight loss advised, avoid alcohol/tylenol, will monitor LFTs  CKD Stage 2 (GFR 69 ml/min) Increase fluids  Avoid NSAIDS Blood pressure control Monitor sugars  Will continue to monitor  Malignant neoplasm of left female breast, unspecified estrogen receptor status, unspecified site of breast (HCC) Monitored  Irritable  bowel syndrome with constipation If not on benefiber then add it, decrease stress,  if any worsening symptoms, blood in stool, AB pain, etc call office  Medication management MAGNESIUM  Constipation Advised to continue increasing fiber and water Use Stool softener- if no relief notify the office Can not tolerate Linzess     Future Appointments  Date Time Provider Department Center  04/28/2023  9:30 AM Jenna Dick, NP [facility] None  07/29/2023  8:15 AM Jenna Miller, Jenna Batty, DO [facility] None  10/30/2023  9:00 AM Jenna Dick, NP [facility] None     Continue diet and meds as discussed. Further disposition pending results of labs.  HPI 67 y.o. female  presents for CPE and follow up on HTN, HLD, GERD, abnormal glucose / prediabetes, IBS-C, CKDII, weight and vitamin D.  She was diagnosed with Parkinson's in 02/2021 with Dr. Arbutus Miller. On Sinemet IR 1 tab TID. Symptoms are currently controlled. Tremor is controlled.   Her blood pressure has been controlled at home, today their BP is  .   BP Readings from Last 3 Encounters:  01/26/23 137/74  10/24/22 120/70  09/23/22 118/70     She denies chest pain, shortness of breath, dizziness.  BMI is There is no height or weight on file to calculate BMI., she is working on diet and exercise. She is walking 8000-14000 steps a day Wt Readings from Last 3 Encounters:  01/26/23 176 lb 6.4 oz (80 kg)  10/24/22 180 lb 12.8 oz (82 kg)  09/23/22 177 lb 12.8 oz (80.6 kg)   She has hypertensive CKD stage 2, she is on HCTZ. Lab Results  Component Value Date   GFRNONAA 76 03/14/2021   She has  IBS-C , previously used Linzess with good results.  Then it was causing too loose of stools. She is increasing fiber, water intake is 80-115 ounces a day.   She is on cholesterol medication, zetia and denies myalgias. Her cholesterol is at goal. The cholesterol last visit was:   Lab Results  Component Value Date   CHOL 215 (H) 10/24/2022   HDL 71 10/24/2022    LDLCALC 124 (H) 10/24/2022   TRIG 97 10/24/2022   CHOLHDL 3.0 10/24/2022    She has been working on diet and exercise for prediabetes, and denies foot ulcerations, hyperglycemia, hypoglycemia , increased appetite, nausea, paresthesia of the feet, polydipsia, polyuria, visual disturbances, vomiting and weight loss. Last A1C in the office was:  Lab Results  Component Value Date   HGBA1C 5.8 (H) 10/24/2022   Patient is on Vitamin D supplement, 8000 a day.   Lab Results  Component Value Date   VD25OH 77 10/24/2022    Current Medications:  Current Outpatient Medications on File Prior to Visit  Medication Sig Dispense Refill   BIOTIN 5000 PO Take by mouth.     carbidopa-levodopa (SINEMET IR) 25-100 MG tablet Take 1 tablet by mouth 3 (three) times daily. 270 tablet 1   Cholecalciferol (VITAMIN D3 PO) Take 5,000 mg by mouth daily.     ezetimibe (ZETIA) 10 MG tablet TAKE 1 TABLET BY MOUTH EVERY DAY FOR CHOLESTEROL 90 tablet 3   famotidine (PEPCID) 40 MG tablet TAKE 1 TABLET 2 X /DAY TO PREVENT HEARTBURN & INDIGESTION 180 tablet 3   fluticasone (FLONASE) 50 MCG/ACT nasal spray SPRAY 2 SPRAYS INTO EACH NOSTRIL EVERY DAY (Patient taking differently: as needed. SPRAY 2 SPRAYS INTO EACH NOSTRIL EVERY DAY) 48 mL 1   gabapentin (NEURONTIN) 100 MG capsule TAKE 1 CAPSULE (100 MG TOTAL) BY MOUTH THREE TIMES DAILY. 90 capsule 0   ibuprofen (IBU) 800 MG tablet Take 1 tablet (800 mg total) by mouth daily as needed. 14 tablet 0   Methylcobalamin (B12-ACTIVE) 1 MG CHEW Chew 1 each by mouth daily.     Zinc Sulfate (ZINC 15 PO) Take by mouth.     No current facility-administered medications on file prior to visit.    Medical History:  Past Medical History:  Diagnosis Date   Acid reflux    Arthritis    Cancer (HCC)    Left Breast   Complication of anesthesia    woke  up X 1   Fatty liver    High cholesterol    Hypertension    Spider bite 2016   Vitamin D deficiency    Immunization History   Administered Date(s) Administered   Influenza Inj Mdck Quad Pf 11/12/2019   Influenza Inj Mdck Quad With Preservative 10/06/2018, 10/13/2019   Influenza Split 08/29/2013, 10/16/2015   Influenza, High Dose Seasonal PF 09/29/2017, 10/16/2020, 10/24/2021   Influenza, Seasonal, Injecte, Preservative Fre 11/02/2014   Influenza,inj,quad, With Preservative 09/24/2016   Influenza-Unspecified 09/10/2022   PFIZER(Purple Top)SARS-COV-2 Vaccination 03/10/2020, 03/31/2020   PNEUMOCOCCAL CONJUGATE-20 10/24/2021   PPD Test 03/14/2014, 07/11/2015   Pneumococcal Polysaccharide-23 05/22/2003   Td 05/28/2005   Tdap 07/11/2015   Zoster Recombinat (Shingrix) 11/20/2021, 02/12/2022   Zoster, Live 02/26/2016   Flu : 10/24/21 Tetanus: 2016 Pneumovax: 2004 Prevnar 20: 10/24/21 Flu vaccine: 2019 Zostavax: 2017  [facility]: [facility] 10/30/2021 Colonoscopy: 2017 due this year, pt will schedule CT AB 2014 CXR 2018 DEXA 2007 PAP 2021- [facility]  Patient Care Team: Lucky Cowboy, MD as  PCP - General (Internal Medicine) Lucky Cowboy, MD as Referring Physician (Internal Medicine) Jenna Miller, Jenna Batty, DO as Consulting Physician (Neurology)  Allergies Allergies  Allergen Reactions   Lipitor [Atorvastatin] Anaphylaxis and Swelling    Other reaction(s): Other (See Comments) Unknown   Effexor [Venlafaxine]     Other reaction(s): Other (See Comments)   Paxil [Paroxetine Hcl]     Other reaction(s): Other (See Comments)   Wellbutrin [Bupropion] Hives    SURGICAL HISTORY She  has a past surgical history that includes TRAM (1999-2000); Knee arthroscopy (1982); Wisdom tooth extraction; Cholecystectomy (11/19/2012); Breast surgery (1995); Hernia repair; and Mastectomy (Left, 1995). FAMILY HISTORY Her family history includes COPD in her sister; Cancer in her cousin; Gout (age of onset: 51) in her father; Hyperlipidemia in her brother and mother; Hypertension in her brother and mother. SOCIAL  HISTORY She  reports that she has never smoked. She has never used smokeless tobacco. She reports current alcohol use of about 1.0 standard drink of alcohol per week. She reports that she does not use drugs.  Review of Systems:  Review of Systems  Constitutional:  Negative for chills, fever and malaise/fatigue.  HENT:  Negative for congestion, ear pain, hearing loss, sinus pain, sore throat and tinnitus.   Eyes:  Negative for blurred vision and double vision.  Respiratory:  Negative for cough, hemoptysis, sputum production, shortness of breath and wheezing.   Cardiovascular:  Negative for chest pain, palpitations and leg swelling.  Gastrointestinal:  Positive for constipation. Negative for abdominal pain, blood in stool, diarrhea, heartburn, melena, nausea and vomiting.  Genitourinary: Negative.  Negative for dysuria and urgency.  Musculoskeletal:  Negative for back pain, falls, joint pain, myalgias and neck pain.  Skin:  Negative for rash.  Neurological:  Positive for tremors (controlled with sinemet) and headaches (irregular doesnt take medication). Negative for dizziness, tingling, sensory change, loss of consciousness and weakness.  Endo/Heme/Allergies:  Does not bruise/bleed easily.  Psychiatric/Behavioral:  Negative for depression and suicidal ideas. The patient has insomnia (difficulty staying asleep). The patient is not nervous/anxious.     Physical Exam: LMP 06/10/2010  Wt Readings from Last 3 Encounters:  01/26/23 176 lb 6.4 oz (80 kg)  10/24/22 180 lb 12.8 oz (82 kg)  09/23/22 177 lb 12.8 oz (80.6 kg)    General Appearance: Well nourished well developed, in no apparent distress. Eyes: PERRLA, EOMs, conjunctiva no swelling or erythema ENT/Mouth: Ear canals normal without obstruction, swelling, erythma, discharge.  TMs normal bilaterally.  Oropharynx moist, clear, without exudate, or postoropharyngeal swelling. Neck: Supple, thyroid normal,no cervical adenopathy  Respiratory:  Respiratory effort normal, Breath sounds clear A&P without rhonchi, wheeze, or rale.  No retractions, no accessory usage. Cardio: RRR with no MRGs. Brisk peripheral pulses without edema.  Abdomen: Soft, + BS,  Non tender, no guarding, rebound, hernias, masses. Musculoskeletal: Full ROM, 5/5 strength, Normal gait. Cogwheel rigidity of LUE.  Skin: Warm, dry without rashes, lesions, ecchymosis.  Neuro: Awake and oriented X 3, Cranial nerves intact. Normal muscle tone, no cerebellar symptoms. Psych: Normal affect, Insight and Judgment appropriate.     Revonda Humphrey ANP-C  [facility]  04/27/2023

## 2023-04-28 ENCOUNTER — Encounter: Payer: Self-pay | Admitting: Nurse Practitioner

## 2023-04-28 ENCOUNTER — Ambulatory Visit: Payer: BC Managed Care – PPO | Admitting: Nurse Practitioner

## 2023-04-28 VITALS — BP 136/74 | HR 86 | Temp 97.5°F | Ht 62.0 in | Wt 176.8 lb

## 2023-04-28 DIAGNOSIS — H524 Presbyopia: Secondary | ICD-10-CM | POA: Diagnosis not present

## 2023-04-28 DIAGNOSIS — H25813 Combined forms of age-related cataract, bilateral: Secondary | ICD-10-CM | POA: Diagnosis not present

## 2023-04-28 DIAGNOSIS — E782 Mixed hyperlipidemia: Secondary | ICD-10-CM | POA: Diagnosis not present

## 2023-04-28 DIAGNOSIS — K581 Irritable bowel syndrome with constipation: Secondary | ICD-10-CM

## 2023-04-28 DIAGNOSIS — R7309 Other abnormal glucose: Secondary | ICD-10-CM | POA: Diagnosis not present

## 2023-04-28 DIAGNOSIS — N182 Chronic kidney disease, stage 2 (mild): Secondary | ICD-10-CM

## 2023-04-28 DIAGNOSIS — H35032 Hypertensive retinopathy, left eye: Secondary | ICD-10-CM | POA: Diagnosis not present

## 2023-04-28 DIAGNOSIS — E559 Vitamin D deficiency, unspecified: Secondary | ICD-10-CM

## 2023-04-28 DIAGNOSIS — H33103 Unspecified retinoschisis, bilateral: Secondary | ICD-10-CM | POA: Diagnosis not present

## 2023-04-28 DIAGNOSIS — K760 Fatty (change of) liver, not elsewhere classified: Secondary | ICD-10-CM

## 2023-04-28 DIAGNOSIS — C50912 Malignant neoplasm of unspecified site of left female breast: Secondary | ICD-10-CM

## 2023-04-28 DIAGNOSIS — I10 Essential (primary) hypertension: Secondary | ICD-10-CM | POA: Diagnosis not present

## 2023-04-28 DIAGNOSIS — Z79899 Other long term (current) drug therapy: Secondary | ICD-10-CM

## 2023-04-28 DIAGNOSIS — K219 Gastro-esophageal reflux disease without esophagitis: Secondary | ICD-10-CM

## 2023-04-28 MED ORDER — FAMOTIDINE 40 MG PO TABS: ORAL_TABLET | ORAL | 3 refills | Status: DC

## 2023-04-28 NOTE — Patient Instructions (Signed)
Start stool softener every day Start align daily as probiotic  Probiotics Probiotics are the good bacteria and yeasts that live in your body and keep your digestive system healthy. Probiotics also help your body's defense system (immune system) and protect your body against the growth of harmful bacteria. Your health care provider may recommend taking a probiotic if you are taking antibiotic medicine or have certain medical conditions, such as: Diarrhea. Constipation. Irritable bowel syndrome. Lung infections. Yeast infections. Acne, eczema, and other skin conditions. Frequent urinary tract infections. What affects the balance of bacteria in my body? The balance of good bacteria in your body can be affected by: Antibiotics. These medicines treat infections caused by bacteria. Unfortunately, they may kill the good bacteria in your body as well as the bad bacteria. Certain medical conditions. Conditions related to an imbalance of bacteria include: Stomach and intestine (gastrointestinal) infections. Lung infections. Skin infections. Vaginal infections. Inflammatory bowel diseases. Stomach ulcers (gastric ulcers). Tooth decay and gum disease (periodontal disease). Stress. A low-fiber diet. What type of probiotic is right for me? Probiotics contain different types of bacteria (strains). Strains commonly found in probiotics include: Lactobacillus. Saccharomyces. Bifidobacterium. Specific strains have been shown to be more effective for certain health conditions. Ask your health care provider which strain or strains you should use and how often. Probiotics come in many different forms, strain combinations, and strengths. Some may need to be refrigerated. Always read the label for storage and usage instructions. Certain foods, such as yogurt, contain probiotics. Probiotics can also be bought as a supplement at a pharmacy, health food store, or grocery store. Talk to your health care provider  before starting any supplement. What are the side effects of probiotics? Some people have side effects when taking probiotics. Side effects are usually temporary and may include: Gas. Bloating. Cramping. Serious side effects are rare. Follow these instructions at home:  Eat foods high in fiber, such as whole grains, beans, and vegetables. These foods can help good bacteria grow. Avoid certain foods as told by your health care provider. If you are taking probiotics with antibiotic medicine, take your probiotics as told by your health care provider. You may have to take probiotics for several weeks. This is to help the growth of good bacteria in your gut. Where to find more information International Scientific Association for Probiotics and Prebiotics: isappscience.org Summary Probiotics are the good bacteria and yeasts that live in your body and keep you and your digestive system healthy. Certain foods, such as yogurt, contain probiotics. Probiotics can be taken as supplements. They can be bought at a pharmacy, health food store, or grocery store. They come in many different forms, strain combinations, and strengths. Be sure to talk with your health care provider before taking a probiotic supplement. This information is not intended to replace advice given to you by your health care provider. Make sure you discuss any questions you have with your health care provider. Document Revised: 10/02/2021 Document Reviewed: 10/02/2021 Elsevier Patient Education  2023 [employer].

## 2023-04-28 NOTE — Progress Notes (Signed)
[facility] - Clinic Note  05/12/2023   CHIEF COMPLAINT Patient presents for Retina Follow Up  HISTORY OF PRESENT ILLNESS: Jenna Miller is a 67 y.o. female who presents to the clinic today for:  HPI     Retina Follow Up   In right eye.  This started 2 weeks ago.  Duration of 2 weeks.  Since onset it is stable.  I, the attending physician,  performed the HPI with the patient and updated documentation appropriately.        Comments   Retina follow up per Dr Zenaida Niece retinoschisis pt is reporting her vision is not as clear she denies flashes or floaters       Last edited by Rennis Chris, MD on 05/12/2023  5:13 PM.     Pt states she went to see Dr. Elmer Picker for regular eye exam, went 2 wks ago. She has noticed VA hasn't been as sharp as it used to be, on the computer a lot. Pt is pre diabetic.   Referring physician: Lucky Cowboy, MD [address] [place],  [place] [address]  HISTORICAL INFORMATION:  Selected notes from the MEDICAL RECORD NUMBER Referred by Dr. Zenaida Niece for retinoschisis OU LEE:  Ocular Hx- PMH-   CURRENT MEDICATIONS: No current outpatient medications on file. (Ophthalmic Drugs)   No current facility-administered medications for this visit. (Ophthalmic Drugs)   Current Outpatient Medications (Other)  Medication Sig   BIOTIN 5000 PO Take by mouth.   carbidopa-levodopa (SINEMET IR) 25-100 MG tablet Take 1 tablet by mouth 3 (three) times daily.   Cholecalciferol (VITAMIN D3 PO) Take 5,000 mg by mouth daily.   ezetimibe (ZETIA) 10 MG tablet TAKE 1 TABLET BY MOUTH EVERY DAY FOR CHOLESTEROL   famotidine (PEPCID) 40 MG tablet TAKE 1 TABLET 2 X /DAY TO PREVENT HEARTBURN & INDIGESTION   fluticasone (FLONASE) 50 MCG/ACT nasal spray SPRAY 2 SPRAYS INTO EACH NOSTRIL EVERY DAY (Patient taking differently: as needed. SPRAY 2 SPRAYS INTO EACH NOSTRIL EVERY DAY)   gabapentin (NEURONTIN) 100 MG capsule TAKE 1 CAPSULE (100 MG TOTAL) BY  MOUTH THREE TIMES DAILY.   ibuprofen (IBU) 800 MG tablet Take 1 tablet (800 mg total) by mouth daily as needed.   Methylcobalamin (B12-ACTIVE) 1 MG CHEW Chew 1 each by mouth daily.   No current facility-administered medications for this visit. (Other)   REVIEW OF SYSTEMS: ROS   Positive for: Gastrointestinal, Neurological, Cardiovascular, Eyes, Allergic/Imm Last edited by Etheleen Mayhew, COT on 05/12/2023  8:16 AM.     ALLERGIES Allergies  Allergen Reactions   Lipitor [Atorvastatin] Anaphylaxis and Swelling    Other reaction(s): Other (See Comments) Unknown   Effexor [Venlafaxine]     Other reaction(s): Other (See Comments)   Paxil [Paroxetine Hcl]     Other reaction(s): Other (See Comments)   Wellbutrin [Bupropion] Hives   PAST MEDICAL HISTORY Past Medical History:  Diagnosis Date   Acid reflux    Arthritis    Cancer (HCC)    Left Breast   Complication of anesthesia    woke  up X 1   Fatty liver    High cholesterol    Hypertension    Spider bite 2016   Vitamin D deficiency    Past Surgical History:  Procedure Laterality Date   BREAST SURGERY  1995   mastectomy left   CHOLECYSTECTOMY  11/19/2012   Procedure: LAPAROSCOPIC CHOLECYSTECTOMY WITH INTRAOPERATIVE CHOLANGIOGRAM;  Surgeon: Liz Malady, MD;  Location:  [facility] OR;  Service: General;  Laterality: N/A;   HERNIA REPAIR     KNEE ARTHROSCOPY  1982   MASTECTOMY Left 1995   TRAM  1999-2000   WISDOM TOOTH EXTRACTION     FAMILY HISTORY Family History  Problem Relation Age of Onset   Hypertension Mother    Hyperlipidemia Mother    Gout Father 87       trauma   Cancer Cousin        lung   Hyperlipidemia Brother    Hypertension Brother    COPD Sister    SOCIAL HISTORY Social History   Tobacco Use   Smoking status: Never   Smokeless tobacco: Never  Vaping Use   Vaping Use: Never used  Substance Use Topics   Alcohol use: Yes    Alcohol/week: 1.0 standard drink of alcohol    Types: 1 Cans of  beer per week    Comment: rare   Drug use: No       OPHTHALMIC EXAM:  Base Eye Exam     Visual Acuity (Snellen - Linear)       Right Left   Dist [place] 20/40 20/40 -1   Dist ph  20/20 -1 20/20 -2         Tonometry (Tonopen, 8:20 AM)       Right Left   Pressure 20 23  squeezing        Pupils       Pupils Dark Light Shape React APD   Right PERRL 3 2 Round Brisk None   Left PERRL 3 2 Round Brisk None         Visual Fields       Left Right    Full Full         Extraocular Movement       Right Left    Full, Ortho Full, Ortho         Neuro/Psych     Oriented x3: Yes   Mood/Affect: Normal         Dilation     Both eyes: 2.5% Phenylephrine @ 8:19 AM           Slit Lamp and Fundus Exam     External Exam       Right Left   External Normal Normal         Slit Lamp Exam       Right Left   Lids/Lashes Dermato, Mild MGD Dermato, Mild MGD   Conjunctiva/Sclera White and quiet White and quiet   Cornea 1+ Punctate epithelial erosions inferiorly Clear   Anterior Chamber Deep and quiet Deep and quiet   Iris Round and dilated Round and dilated   Lens 2+ Nuclear sclerosis, +2-3Cortical cataract 2+ Nuclear sclerosis, +2-3 Cortical cataract   Anterior Vitreous Mild Synersis Mild Synersis         Fundus Exam       Right Left   Disc Pink and sharp Pink and sharp   C/D Ratio 0.3 0.3   Macula Flat, Good foveal reflex, Mild RPE mottling, no heme or edema Flat, Good foveal reflex, Mild RPE mottling, no heme or edema   Vessels Normal Normal   Periphery Attached, mild reticular degeneration, no heme, shallow schisis IT and temp periphery Attached, no heme, moderate schisis cavity IT periphery spanning 0430-0530.           IMAGING AND PROCEDURES  Imaging and Procedures for 05/12/2023  OCT, Retina - OU - Both Eyes  Right Eye Quality was good. Central Foveal Thickness: 300. Progression has no prior data. Findings include normal foveal  contour, no IRF, no SRF, vitreomacular adhesion (Shallow schisis temp periphery, greatest IT. ).   Left Eye Quality was good. Central Foveal Thickness: 297. Progression has no prior data. Findings include normal foveal contour, no IRF, no SRF, vitreomacular adhesion (Retinoschisis temp periphery, greatest IT. ).   Notes *Images captured and stored on drive  Diagnosis / Impression:  OD: Shallow schisis temp periphery, greatest IT quad  OS: Retinoschisis temp periphery, greatest IT quad  Clinical management:  See below  Abbreviations: NFP - Normal foveal profile. CME - cystoid macular edema. PED - pigment epithelial detachment. IRF - intraretinal fluid. SRF - subretinal fluid. EZ - ellipsoid zone. ERM - epiretinal membrane. ORA - outer retinal atrophy. ORT - outer retinal tubulation. SRHM - subretinal hyper-reflective material. IRHM - intraretinal hyper-reflective material      Color Fundus Photography Optos - OU - Both Eyes       Right Eye Progression has no prior data. Disc findings include normal observations. Macula : normal observations. Vessels : normal observations. Periphery : RPE abnormality (Mild reticular degeneration; shallow schisis IT periphery).   Left Eye Progression has no prior data. Disc findings include normal observations. Macula : normal observations. Vessels : normal observations. Periphery : RPE abnormality (Mild reticular degeneration; retinoschisis IT periphery (0400-0500)).   Notes **Images stored on drive**  Impression: OD: Mild reticular degeneration; shallow schisis IT periphery OS: Mild reticular degeneration; retinoschisis IT periphery (0400-0500)           ASSESSMENT/PLAN:   ICD-10-CM   1. Bilateral retinoschisis  H33.103 OCT, Retina - OU - Both Eyes    Color Fundus Photography Optos - OU - Both Eyes    2. Mixed type age-related cataract, both eyes  H25.813      1. Retinoschisis OU  - BCVA 20/20 OU - OCT shows OD: Shallow schisis temp  periphery, greatest IT. OS: Retinoschisis temp periphery, greatest IT. - baseline Optos color fundus images obtained for future comparison - discussed findings, prognosis and potential treatment options - no retinal or ophthalmic interventions indicated or recommended at this time - reviewed signs and symptoms of RT/RD -- strict return precautions reviewed - Monitor, pt to contact us any sooner if VA decreases/changes.  - f/u 4-6 months, DFE, OCT   2. Mixed Cataract OU - The symptoms of cataract, surgical options, and treatments and risks were discussed with patient. - discussed diagnosis and progression - monitor   Ophthalmic Meds Ordered this visit:  No orders of the defined types were placed in this encounter.    Return for 4-6 months, schisis OU, DFE, OCT.  There are no Patient Instructions on file for this visit.  Explained the diagnoses, plan, and follow up with the patient and they expressed understanding.  Patient expressed understanding of the importance of proper follow up care.   This document serves as a record of services personally performed by Karie Chimera, MD, PhD. It was created on their behalf by Gerilyn Nestle, COT an ophthalmic technician. The creation of this record is the provider's dictation and/or activities during the visit.    Electronically signed by:  Gerilyn Nestle, COT  4.30.24 5:21 PM   This document serves as a record of services personally performed by Karie Chimera, MD, PhD. It was created on their behalf by De Blanch, an ophthalmic technician. The creation of this record is the provider's dictation  and/or activities during the visit.    Electronically signed by: De Blanch, OA, 05/12/23  5:21 PM   Karie Chimera, M.D., Ph.D. Diseases & Surgery of the Retina and Vitreous [facility] 05/12/2023  I have reviewed the above documentation for accuracy and completeness, and I agree with the above. Karie Chimera, M.D., Ph.D. 05/12/23 5:25 PM   Abbreviations: M myopia (nearsighted); A astigmatism; H hyperopia (farsighted); P presbyopia; Mrx spectacle prescription;  CTL contact lenses; OD right eye; OS left eye; OU both eyes  XT exotropia; ET esotropia; PEK punctate epithelial keratitis; PEE punctate epithelial erosions; DES dry eye syndrome; MGD meibomian gland dysfunction; ATs artificial tears; PFAT's preservative free artificial tears; NSC nuclear sclerotic cataract; PSC posterior subcapsular cataract; ERM epi-retinal membrane; PVD posterior vitreous detachment; RD retinal detachment; DM diabetes mellitus; DR diabetic retinopathy; NPDR non-proliferative diabetic retinopathy; PDR proliferative diabetic retinopathy; CSME clinically significant macular edema; DME diabetic macular edema; dbh dot blot hemorrhages; CWS cotton wool spot; POAG primary open angle glaucoma; C/D cup-to-disc ratio; HVF humphrey visual field; GVF goldmann visual field; OCT optical coherence tomography; IOP intraocular pressure; BRVO Branch retinal vein occlusion; CRVO central retinal vein occlusion; CRAO central retinal artery occlusion; BRAO branch retinal artery occlusion; RT retinal tear; SB scleral buckle; PPV pars plana vitrectomy; VH Vitreous hemorrhage; PRP panretinal laser photocoagulation; IVK intravitreal kenalog; VMT vitreomacular traction; MH Macular hole;  NVD neovascularization of the disc; NVE neovascularization elsewhere; AREDS age related eye disease study; ARMD age related macular degeneration; POAG primary open angle glaucoma; EBMD epithelial/anterior basement membrane dystrophy; ACIOL anterior chamber intraocular lens; IOL intraocular lens; PCIOL posterior chamber intraocular lens; Phaco/IOL phacoemulsification with intraocular lens placement; PRK photorefractive keratectomy; LASIK laser assisted in situ keratomileusis; HTN hypertension; DM diabetes mellitus; COPD chronic obstructive pulmonary disease

## 2023-04-29 LAB — COMPLETE METABOLIC PANEL WITH GFR
AG Ratio: 1.8 (calc) (ref 1.0–2.5)
ALT: 9 U/L (ref 6–29)
AST: 17 U/L (ref 10–35)
Albumin: 4.5 g/dL (ref 3.6–5.1)
Alkaline phosphatase (APISO): 55 U/L (ref 37–153)
BUN: 18 mg/dL (ref 7–25)
CO2: 27 mmol/L (ref 20–32)
Calcium: 9.9 mg/dL (ref 8.6–10.4)
Chloride: 105 mmol/L (ref 98–110)
Creat: 0.96 mg/dL (ref 0.50–1.05)
Globulin: 2.5 g/dL (calc) (ref 1.9–3.7)
Glucose, Bld: 99 mg/dL (ref 65–99)
Potassium: 4.2 mmol/L (ref 3.5–5.3)
Sodium: 140 mmol/L (ref 135–146)
Total Bilirubin: 0.5 mg/dL (ref 0.2–1.2)
Total Protein: 7 g/dL (ref 6.1–8.1)
eGFR: 64 mL/min/{1.73_m2} (ref >=60)

## 2023-04-29 LAB — CBC WITH DIFFERENTIAL/PLATELET
Absolute Monocytes: 435 cells/uL (ref 200–950)
Basophils Absolute: 38 cells/uL (ref 0–200)
Basophils Relative: 0.6 %
Eosinophils Absolute: 38 cells/uL (ref 15–500)
Eosinophils Relative: 0.6 %
HCT: 38.2 % (ref 35.0–45.0)
Hemoglobin: 12.8 g/dL (ref 11.7–15.5)
Lymphs Abs: 1348 cells/uL (ref 850–3900)
MCH: 29.8 pg (ref 27.0–33.0)
MCHC: 33.5 g/dL (ref 32.0–36.0)
MCV: 89 fL (ref 80.0–100.0)
MPV: 10.7 fL (ref 7.5–12.5)
Monocytes Relative: 6.9 %
Neutro Abs: 4442 cells/uL (ref 1500–7800)
Neutrophils Relative %: 70.5 %
Platelets: 216 10*3/uL (ref 140–400)
RBC: 4.29 10*6/uL (ref 3.80–5.10)
RDW: 13 % (ref 11.0–15.0)
Total Lymphocyte: 21.4 %
WBC: 6.3 10*3/uL (ref 3.8–10.8)

## 2023-04-29 LAB — TSH: TSH: 1.3 mIU/L (ref 0.40–4.50)

## 2023-04-29 LAB — LIPID PANEL
Cholesterol: 219 mg/dL — ABNORMAL HIGH (ref <200)
HDL: 74 mg/dL (ref >=50)
LDL Cholesterol (Calc): 128 mg/dL (calc) — ABNORMAL HIGH
Non-HDL Cholesterol (Calc): 145 mg/dL (calc) — ABNORMAL HIGH (ref <130)
Total CHOL/HDL Ratio: 3 (calc) (ref <5.0)
Triglycerides: 74 mg/dL (ref <150)

## 2023-04-29 LAB — HEMOGLOBIN A1C
Hgb A1c MFr Bld: 6.1 % of total Hgb — ABNORMAL HIGH (ref <5.7)
Mean Plasma Glucose: 128 mg/dL
eAG (mmol/L): 7.1 mmol/L

## 2023-05-05 DIAGNOSIS — Z809 Family history of malignant neoplasm, unspecified: Secondary | ICD-10-CM | POA: Diagnosis not present

## 2023-05-12 ENCOUNTER — Encounter (INDEPENDENT_AMBULATORY_CARE_PROVIDER_SITE_OTHER): Payer: Self-pay | Admitting: Ophthalmology

## 2023-05-12 ENCOUNTER — Ambulatory Visit (INDEPENDENT_AMBULATORY_CARE_PROVIDER_SITE_OTHER): Payer: BC Managed Care – PPO | Admitting: Ophthalmology

## 2023-05-12 DIAGNOSIS — H25813 Combined forms of age-related cataract, bilateral: Secondary | ICD-10-CM

## 2023-05-12 DIAGNOSIS — H33103 Unspecified retinoschisis, bilateral: Secondary | ICD-10-CM | POA: Diagnosis not present

## 2023-06-02 DIAGNOSIS — Z13820 Encounter for screening for osteoporosis: Secondary | ICD-10-CM | POA: Diagnosis not present

## 2023-07-27 NOTE — Progress Notes (Unsigned)
Assessment/Plan:   1.  Parkinsons Disease, diagnosed March, 2022  -continue carbidopa/levodopa 25/100, 1 at 7am/11am/4pm.   -mild Parkinsons Disease dyskinesia - not bothersome to patient  -discussed exercise and impact of exercise  -She is followed with dermatology regularly.   Subjective:   Jenna Miller was seen today in follow up for newly diagnosed Parkinsons disease.  My previous records were reviewed prior to todays visit as well as outside records available to me.  Pt denies falls.  Pt denies lightheadedness, near syncope.  No hallucinations.  Mood has been good.  Dyskinesia has not been significant.  She follows with her dermatologist regularly.  Current prescribed movement disorder medications: Carbidopa/levodopa 25/100, 1 tablet 3 times per day    PREVIOUS MEDICATIONS: Sinemet  ALLERGIES:   Allergies  Allergen Reactions   Lipitor [Atorvastatin] Anaphylaxis and Swelling    Other reaction(s): Other (See Comments) Unknown   Effexor [Venlafaxine]     Other reaction(s): Other (See Comments)   Paxil [Paroxetine Hcl]     Other reaction(s): Other (See Comments)   Wellbutrin [Bupropion] Hives    CURRENT MEDICATIONS:  Outpatient Encounter Medications as of 07/29/2023  Medication Sig   BIOTIN 5000 PO Take by mouth.   carbidopa-levodopa (SINEMET IR) 25-100 MG tablet Take 1 tablet by mouth 3 (three) times daily.   Cholecalciferol (VITAMIN D3 PO) Take 5,000 mg by mouth daily.   ezetimibe (ZETIA) 10 MG tablet TAKE 1 TABLET BY MOUTH EVERY DAY FOR CHOLESTEROL   famotidine (PEPCID) 40 MG tablet TAKE 1 TABLET 2 X /DAY TO PREVENT HEARTBURN & INDIGESTION   fluticasone (FLONASE) 50 MCG/ACT nasal spray SPRAY 2 SPRAYS INTO EACH NOSTRIL EVERY DAY (Patient taking differently: as needed. SPRAY 2 SPRAYS INTO EACH NOSTRIL EVERY DAY)   gabapentin (NEURONTIN) 100 MG capsule TAKE 1 CAPSULE (100 MG TOTAL) BY MOUTH THREE TIMES DAILY.   ibuprofen (IBU) 800 MG tablet Take 1 tablet (800 mg  total) by mouth daily as needed.   Methylcobalamin (B12-ACTIVE) 1 MG CHEW Chew 1 each by mouth daily.   No facility-administered encounter medications on file as of 07/29/2023.    Objective:   PHYSICAL EXAMINATION:    VITALS:   There were no vitals filed for this visit.   GEN:  The patient appears stated age and is in NAD. HEENT:  Normocephalic, atraumatic.  The mucous membranes are moist. The superficial temporal arteries are without ropiness or tenderness. CV:  RRR Lungs:  CTAB Neck/HEME:  There are no carotid bruits bilaterally.  Neurological examination:  Orientation: The patient is alert and oriented x3. Cranial nerves: There is good facial symmetry with facial hypomimia. The speech is fluent and clear. Soft palate rises symmetrically and there is no tongue deviation. Hearing is intact to conversational tone. Sensation: Sensation is intact to light touch throughout Motor: Strength is at least antigravity x4.  Movement examination: Tone: There is minimal increased tone in the LUE (same as last visit) Abnormal movements:no tremor; mild dyskinesia on the L leg Coordination:  There is mild decremation with RAM's, with any form of RAMS, including alternating supination and pronation of the forearm, hand opening and closing, finger taps, heel taps and toe taps, on the L (same as last visit) Gait and Station: The patient has no difficulty arising out of a deep-seated chair without the use of the hands. The patient's stride length is good with decreased arm swing bilaterally  I have reviewed and interpreted the following labs independently    Chemistry  Component Value Date/Time   NA 140 04/28/2023 0957   K 4.2 04/28/2023 0957   CL 105 04/28/2023 0957   CO2 27 04/28/2023 0957   BUN 18 04/28/2023 0957   CREATININE 0.96 04/28/2023 0957      Component Value Date/Time   CALCIUM 9.9 04/28/2023 0957   ALKPHOS 52 02/20/2017 0934   AST 17 04/28/2023 0957   ALT 9 04/28/2023  0957   BILITOT 0.5 04/28/2023 0957       Lab Results  Component Value Date   WBC 6.3 04/28/2023   HGB 12.8 04/28/2023   HCT 38.2 04/28/2023   MCV 89.0 04/28/2023   PLT 216 04/28/2023    Lab Results  Component Value Date   TSH 1.30 04/28/2023    Total time spent on today's visit was *** minutes, including both face-to-face time and nonface-to-face time.  Time included that spent on review of records (prior notes available to me/labs/imaging if pertinent), discussing treatment and goals, answering patient's questions and coordinating care.    Cc:  Lucky Cowboy, MD

## 2023-07-29 ENCOUNTER — Ambulatory Visit: Payer: BC Managed Care – PPO | Admitting: Neurology

## 2023-07-29 ENCOUNTER — Encounter: Payer: Self-pay | Admitting: Neurology

## 2023-07-29 VITALS — BP 120/76 | HR 77 | Ht 62.0 in | Wt 181.2 lb

## 2023-07-29 DIAGNOSIS — K5901 Slow transit constipation: Secondary | ICD-10-CM

## 2023-07-29 DIAGNOSIS — R110 Nausea: Secondary | ICD-10-CM

## 2023-07-29 DIAGNOSIS — G20B1 Parkinson's disease with dyskinesia, without mention of fluctuations: Secondary | ICD-10-CM | POA: Diagnosis not present

## 2023-07-29 MED ORDER — CARBIDOPA-LEVODOPA 25-100 MG PO TABS: 1.0000 | ORAL_TABLET | Freq: Three times a day (TID) | ORAL | 1 refills | Status: DC

## 2023-07-29 NOTE — Patient Instructions (Addendum)
Constipation and Parkinson's disease:  1.Rancho recipe for constipation in Parkinsons Disease:  -1 cup of unprocessed bran (need to get this at [employer], [employer] or similar type of store), 2 cups of applesauce in 1 cup of prune juice 2.  Increase fiber intake (Metamucil,vegetables) 3.  Regular, moderate exercise can be beneficial. 4.  Avoid medications causing constipation, such as medications like antacids with calcium or magnesium 5.  It's okay to take daily Miralax, and taper if stools become too loose or you experience diarrhea 6.  Stool softeners (Colace) can help with chronic constipation and I recommend you take this daily. 7.  Increase water intake.  You should be drinking 1/2 gallon of water a day as long as you have not been diagnosed with congestive heart failure or renal/kidney failure.  This is probably the single greatest thing that you can do to help your constipation.   SAVE THE DATE!  We are planning a Parkinsons Disease educational symposium at [facility] in [place] on October 11.  More details to come!  If you would like to be added to our email list to get further information, email [email or web address].  We hope to see you there!

## 2023-08-26 DIAGNOSIS — L821 Other seborrheic keratosis: Secondary | ICD-10-CM | POA: Diagnosis not present

## 2023-08-26 DIAGNOSIS — L814 Other melanin hyperpigmentation: Secondary | ICD-10-CM | POA: Diagnosis not present

## 2023-08-26 DIAGNOSIS — D1801 Hemangioma of skin and subcutaneous tissue: Secondary | ICD-10-CM | POA: Diagnosis not present

## 2023-08-26 DIAGNOSIS — B360 Pityriasis versicolor: Secondary | ICD-10-CM | POA: Diagnosis not present

## 2023-09-15 ENCOUNTER — Telehealth: Payer: Self-pay | Admitting: Neurology

## 2023-09-15 NOTE — Telephone Encounter (Signed)
Pt called in stating she is supposed to get her flu shot tomorrow 09/16/23 at 8:00 AM and wants to make sure her carbidopa-levodopa will not cause any issues with the flu shot?

## 2023-09-15 NOTE — Telephone Encounter (Signed)
Called patient and left a detailed message per DPR that Dr. Arbutus Leas has no objections to receiving the vaccine. Left contact information incase patient had any questions or concerns.

## 2023-10-01 NOTE — Progress Notes (Signed)
[facility] - Clinic Note  10/14/2023   CHIEF COMPLAINT Patient presents for Retina Follow Up  HISTORY OF PRESENT ILLNESS: Jenna Miller is a 67 y.o. female who presents to the clinic today for:  HPI     Retina Follow Up   Patient presents with  Other.  In both eyes.  This started 5 months ago.  I, the attending physician,  performed the HPI with the patient and updated documentation appropriately.        Comments   Patient here for 5 months retina follow up for retinoschisis OU. Patient states vision a little fuzzy sometimes. No eye pain.       Last edited by Rennis Chris, MD on 10/14/2023  8:20 AM.    Pt feels like her vision is a little more blurry than last time she was here   Referring physician: Lucky Cowboy, MD [address] [place],  [place] [address]  HISTORICAL INFORMATION:  Selected notes from the MEDICAL RECORD NUMBER Referred by Dr. Zenaida Niece for retinoschisis OU LEE:  Ocular Hx- PMH-   CURRENT MEDICATIONS: No current outpatient medications on file. (Ophthalmic Drugs)   No current facility-administered medications for this visit. (Ophthalmic Drugs)   Current Outpatient Medications (Other)  Medication Sig   BIOTIN 5000 PO Take by mouth.   carbidopa-levodopa (SINEMET IR) 25-100 MG tablet Take 1 tablet by mouth 3 (three) times daily.   Cholecalciferol (VITAMIN D3 PO) Take 5,000 mg by mouth daily.   ezetimibe (ZETIA) 10 MG tablet TAKE 1 TABLET BY MOUTH EVERY DAY FOR CHOLESTEROL   famotidine (PEPCID) 40 MG tablet TAKE 1 TABLET 2 X /DAY TO PREVENT HEARTBURN & INDIGESTION   fluticasone (FLONASE) 50 MCG/ACT nasal spray SPRAY 2 SPRAYS INTO EACH NOSTRIL EVERY DAY (Patient taking differently: as needed. SPRAY 2 SPRAYS INTO EACH NOSTRIL EVERY DAY)   Methylcobalamin (B12-ACTIVE) 1 MG CHEW Chew 1 each by mouth daily.   gabapentin (NEURONTIN) 100 MG capsule TAKE 1 CAPSULE (100 MG TOTAL) BY MOUTH THREE TIMES DAILY. (Patient  taking differently: 100 mg 3 (three) times daily as needed.)   No current facility-administered medications for this visit. (Other)   REVIEW OF SYSTEMS: ROS   Positive for: Gastrointestinal, Neurological, Cardiovascular, Eyes, Allergic/Imm Last edited by Laddie Aquas, COA on 10/14/2023  7:49 AM.      ALLERGIES Allergies  Allergen Reactions   Lipitor [Atorvastatin] Anaphylaxis and Swelling    Other reaction(s): Other (See Comments) Unknown   Effexor [Venlafaxine]     Other reaction(s): Other (See Comments)   Paxil [Paroxetine Hcl]     Other reaction(s): Other (See Comments)   Wellbutrin [Bupropion] Hives   PAST MEDICAL HISTORY Past Medical History:  Diagnosis Date   Acid reflux    Arthritis    Cancer (HCC)    Left Breast   Complication of anesthesia    woke  up X 1   Fatty liver    High cholesterol    Hypertension    Spider bite 2016   Vitamin D deficiency    Past Surgical History:  Procedure Laterality Date   BREAST SURGERY  1995   mastectomy left   CHOLECYSTECTOMY  11/19/2012   Procedure: LAPAROSCOPIC CHOLECYSTECTOMY WITH INTRAOPERATIVE CHOLANGIOGRAM;  Surgeon: Liz Malady, MD;  Location: [facility] OR;  Service: General;  Laterality: N/A;   HERNIA REPAIR     KNEE ARTHROSCOPY  1982   MASTECTOMY Left 1995   TRAM  1999-2000   WISDOM TOOTH  EXTRACTION     FAMILY HISTORY Family History  Problem Relation Age of Onset   Hypertension Mother    Hyperlipidemia Mother    Gout Father 87       trauma   Cancer Cousin        lung   Hyperlipidemia Brother    Hypertension Brother    COPD Sister    SOCIAL HISTORY Social History   Tobacco Use   Smoking status: Never   Smokeless tobacco: Never  Vaping Use   Vaping status: Never Used  Substance Use Topics   Alcohol use: Yes    Alcohol/week: 1.0 standard drink of alcohol    Types: 1 Cans of beer per week    Comment: rare   Drug use: No       OPHTHALMIC EXAM:  Base Eye Exam     Visual Acuity (Snellen -  Linear)       Right Left   Dist [place] 20/60 -2 20/50 -1   Dist ph [place] 20/25 -1 20/20 -1         Tonometry (Tonopen, 7:46 AM)       Right Left   Pressure 15 17         Pupils       Dark Light Shape React APD   Right 4 3 Round Brisk None   Left 4 3 Round Brisk None         Visual Fields (Counting fingers)       Left Right    Full Full         Extraocular Movement       Right Left    Full, Ortho Full, Ortho         Neuro/Psych     Oriented x3: Yes   Mood/Affect: Normal         Dilation     Both eyes: 1.0% Mydriacyl, 2.5% Phenylephrine @ 7:46 AM           Slit Lamp and Fundus Exam     External Exam       Right Left   External Normal Normal         Slit Lamp Exam       Right Left   Lids/Lashes Dermato, Mild MGD Dermato, Mild MGD   Conjunctiva/Sclera White and quiet White and quiet   Cornea Trace tear film debris Clear   Anterior Chamber deep and clear deep and clear   Iris Round and dilated Round and dilated   Lens 2-3+ Nuclear sclerosis, 2-3+Cortical cataract 2-3+ Nuclear sclerosis, +2-3 Cortical cataract   Anterior Vitreous mild syneresis mild syneresis         Fundus Exam       Right Left   Disc Pink and sharp Pink and sharp   C/D Ratio 0.3 0.3   Macula Flat, Good foveal reflex, Mild RPE mottling, no heme or edema Flat, Good foveal reflex, Mild RPE mottling, no heme or edema   Vessels Normal Normal   Periphery Attached, mild reticular degeneration, no heme, shallow schisis IT periphery Attached, no heme, moderate schisis cavity IT periphery spanning [id number]           IMAGING AND PROCEDURES  Imaging and Procedures for 10/14/2023  OCT, Retina - OU - Both Eyes       Right Eye Quality was good. Central Foveal Thickness: 296. Progression has been stable. Findings include normal foveal contour, no IRF, no SRF, vitreomacular adhesion (Shallow schisis IT periphery -- caught  on widefield).   Left Eye Quality was good.  Central Foveal Thickness: 292. Progression has been stable. Findings include normal foveal contour, no IRF, no SRF, vitreomacular adhesion (Bullous schisis IT quad -- caught on widefield).   Notes *Images captured and stored on drive  Diagnosis / Impression:  OD: Shallow schisis IT periphery -- caught on widefield OS: Bullous schisis IT quad -- caught on widefield  Clinical management:  See below  Abbreviations: NFP - Normal foveal profile. CME - cystoid macular edema. PED - pigment epithelial detachment. IRF - intraretinal fluid. SRF - subretinal fluid. EZ - ellipsoid zone. ERM - epiretinal membrane. ORA - outer retinal atrophy. ORT - outer retinal tubulation. SRHM - subretinal hyper-reflective material. IRHM - intraretinal hyper-reflective material           ASSESSMENT/PLAN:   ICD-10-CM   1. Bilateral retinoschisis  H33.103 OCT, Retina - OU - Both Eyes    2. Mixed type age-related cataract, both eyes  H25.813       1. Retinoschisis OU -- stable  - BCVA OD 20/25, OS 20/20 - OCT shows OD: Shallow schisis IT periphery -- caught on widefield; OS: Bullous schisis IT quad -- caught on widefield - baseline Optos color fundus images obtained for future comparison - discussed findings, prognosis and potential treatment options - no retinal or ophthalmic interventions indicated or recommended at this time - reviewed signs and symptoms of RT/RD -- strict return precautions reviewed - Monitor, pt to contact us any sooner if VA decreases/changes.  - f/u 9-12 months, DFE, OCT   2. Mixed Cataract OU - The symptoms of cataract, surgical options, and treatments and risks were discussed with patient. - discussed diagnosis and progression - monitor   Ophthalmic Meds Ordered this visit:  No orders of the defined types were placed in this encounter.    Return for f/u 9-12 months, retinoschisis OU, DFE, OCT.  There are no Patient Instructions on file for this visit.  Explained the  diagnoses, plan, and follow up with the patient and they expressed understanding.  Patient expressed understanding of the importance of proper follow up care.    This document serves as a record of services personally performed by Karie Chimera, MD, PhD. It was created on their behalf by De Blanch, an ophthalmic technician. The creation of this record is the provider's dictation and/or activities during the visit.    Electronically signed by: De Blanch, OA, 10/16/23  12:45 AM  This document serves as a record of services personally performed by Karie Chimera, MD, PhD. It was created on their behalf by Glee Arvin. Manson Passey, OA an ophthalmic technician. The creation of this record is the provider's dictation and/or activities during the visit.    Electronically signed by: Glee Arvin. Manson Passey, OA 10/16/23 12:45 AM  Karie Chimera, M.D., Ph.D. Diseases & Surgery of the Retina and Vitreous [facility] 10/14/2023  I have reviewed the above documentation for accuracy and completeness, and I agree with the above. Karie Chimera, M.D., Ph.D. 10/16/23 12:47 AM  Abbreviations: M myopia (nearsighted); A astigmatism; H hyperopia (farsighted); P presbyopia; Mrx spectacle prescription;  CTL contact lenses; OD right eye; OS left eye; OU both eyes  XT exotropia; ET esotropia; PEK punctate epithelial keratitis; PEE punctate epithelial erosions; DES dry eye syndrome; MGD meibomian gland dysfunction; ATs artificial tears; PFAT's preservative free artificial tears; NSC nuclear sclerotic cataract; PSC posterior subcapsular cataract; ERM epi-retinal membrane; PVD posterior vitreous detachment; RD  retinal detachment; DM diabetes mellitus; DR diabetic retinopathy; NPDR non-proliferative diabetic retinopathy; PDR proliferative diabetic retinopathy; CSME clinically significant macular edema; DME diabetic macular edema; dbh dot blot hemorrhages; CWS cotton wool spot; POAG primary open angle  glaucoma; C/D cup-to-disc ratio; HVF humphrey visual field; GVF goldmann visual field; OCT optical coherence tomography; IOP intraocular pressure; BRVO Branch retinal vein occlusion; CRVO central retinal vein occlusion; CRAO central retinal artery occlusion; BRAO branch retinal artery occlusion; RT retinal tear; SB scleral buckle; PPV pars plana vitrectomy; VH Vitreous hemorrhage; PRP panretinal laser photocoagulation; IVK intravitreal kenalog; VMT vitreomacular traction; MH Macular hole;  NVD neovascularization of the disc; NVE neovascularization elsewhere; AREDS age related eye disease study; ARMD age related macular degeneration; POAG primary open angle glaucoma; EBMD epithelial/anterior basement membrane dystrophy; ACIOL anterior chamber intraocular lens; IOL intraocular lens; PCIOL posterior chamber intraocular lens; Phaco/IOL phacoemulsification with intraocular lens placement; PRK photorefractive keratectomy; LASIK laser assisted in situ keratomileusis; HTN hypertension; DM diabetes mellitus; COPD chronic obstructive pulmonary disease

## 2023-10-14 ENCOUNTER — Ambulatory Visit (INDEPENDENT_AMBULATORY_CARE_PROVIDER_SITE_OTHER): Payer: BC Managed Care – PPO | Admitting: Ophthalmology

## 2023-10-14 ENCOUNTER — Encounter (INDEPENDENT_AMBULATORY_CARE_PROVIDER_SITE_OTHER): Payer: Self-pay | Admitting: Ophthalmology

## 2023-10-14 DIAGNOSIS — H33103 Unspecified retinoschisis, bilateral: Secondary | ICD-10-CM | POA: Diagnosis not present

## 2023-10-14 DIAGNOSIS — H25813 Combined forms of age-related cataract, bilateral: Secondary | ICD-10-CM | POA: Diagnosis not present

## 2023-10-30 ENCOUNTER — Encounter: Payer: Self-pay | Admitting: Nurse Practitioner

## 2023-10-30 ENCOUNTER — Ambulatory Visit: Payer: BC Managed Care – PPO | Admitting: Nurse Practitioner

## 2023-10-30 VITALS — BP 118/68 | HR 73 | Temp 97.8°F | Ht 62.0 in | Wt 182.2 lb

## 2023-10-30 DIAGNOSIS — Z1389 Encounter for screening for other disorder: Secondary | ICD-10-CM

## 2023-10-30 DIAGNOSIS — K219 Gastro-esophageal reflux disease without esophagitis: Secondary | ICD-10-CM

## 2023-10-30 DIAGNOSIS — R7309 Other abnormal glucose: Secondary | ICD-10-CM | POA: Diagnosis not present

## 2023-10-30 DIAGNOSIS — E782 Mixed hyperlipidemia: Secondary | ICD-10-CM

## 2023-10-30 DIAGNOSIS — Z0000 Encounter for general adult medical examination without abnormal findings: Secondary | ICD-10-CM | POA: Diagnosis not present

## 2023-10-30 DIAGNOSIS — Z131 Encounter for screening for diabetes mellitus: Secondary | ICD-10-CM

## 2023-10-30 DIAGNOSIS — K760 Fatty (change of) liver, not elsewhere classified: Secondary | ICD-10-CM

## 2023-10-30 DIAGNOSIS — Z13220 Encounter for screening for lipoid disorders: Secondary | ICD-10-CM

## 2023-10-30 DIAGNOSIS — E559 Vitamin D deficiency, unspecified: Secondary | ICD-10-CM | POA: Diagnosis not present

## 2023-10-30 DIAGNOSIS — Z79899 Other long term (current) drug therapy: Secondary | ICD-10-CM

## 2023-10-30 DIAGNOSIS — Z136 Encounter for screening for cardiovascular disorders: Secondary | ICD-10-CM | POA: Diagnosis not present

## 2023-10-30 DIAGNOSIS — I700 Atherosclerosis of aorta: Secondary | ICD-10-CM | POA: Diagnosis not present

## 2023-10-30 DIAGNOSIS — I10 Essential (primary) hypertension: Secondary | ICD-10-CM

## 2023-10-30 DIAGNOSIS — N182 Chronic kidney disease, stage 2 (mild): Secondary | ICD-10-CM

## 2023-10-30 DIAGNOSIS — N898 Other specified noninflammatory disorders of vagina: Secondary | ICD-10-CM | POA: Diagnosis not present

## 2023-10-30 DIAGNOSIS — Z23 Encounter for immunization: Secondary | ICD-10-CM | POA: Diagnosis not present

## 2023-10-30 DIAGNOSIS — G43809 Other migraine, not intractable, without status migrainosus: Secondary | ICD-10-CM

## 2023-10-30 DIAGNOSIS — M79601 Pain in right arm: Secondary | ICD-10-CM

## 2023-10-30 DIAGNOSIS — K581 Irritable bowel syndrome with constipation: Secondary | ICD-10-CM

## 2023-10-30 DIAGNOSIS — G20A1 Parkinson's disease without dyskinesia, without mention of fluctuations: Secondary | ICD-10-CM

## 2023-10-30 DIAGNOSIS — H33103 Unspecified retinoschisis, bilateral: Secondary | ICD-10-CM

## 2023-10-30 DIAGNOSIS — Z1329 Encounter for screening for other suspected endocrine disorder: Secondary | ICD-10-CM

## 2023-10-30 DIAGNOSIS — C50912 Malignant neoplasm of unspecified site of left female breast: Secondary | ICD-10-CM

## 2023-10-30 DIAGNOSIS — Z0001 Encounter for general adult medical examination with abnormal findings: Secondary | ICD-10-CM

## 2023-10-30 DIAGNOSIS — K5903 Drug induced constipation: Secondary | ICD-10-CM

## 2023-10-30 DIAGNOSIS — Z888 Allergy status to other drugs, medicaments and biological substances status: Secondary | ICD-10-CM

## 2023-10-30 MED ORDER — LINACLOTIDE 72 MCG PO CAPS: 72.0000 ug | ORAL_CAPSULE | Freq: Every day | ORAL | 2 refills | Status: DC

## 2023-10-30 NOTE — Patient Instructions (Signed)
Start Linzess 72 mg every other day for constipation Call to schedule colonoscopy  Constipation, Adult Constipation is when a person has fewer than three bowel movements in a week, has difficulty having a bowel movement, or has stools (feces) that are dry, hard, or larger than normal. Constipation may be caused by an underlying condition. It may become worse with age if a person takes certain medicines and does not take in enough fluids. Follow these instructions at home: Eating and drinking  Eat foods that have a lot of fiber, such as beans, whole grains, and fresh fruits and vegetables. Limit foods that are low in fiber and high in fat and processed sugars, such as fried or sweet foods. These include french fries, hamburgers, cookies, candies, and soda. Drink enough fluid to keep your urine pale yellow. General instructions Exercise regularly or as told by your health care provider. Try to do 150 minutes of moderate exercise each week. Use the bathroom when you have the urge to go. Do not hold it in. Take over-the-counter and prescription medicines only as told by your health care provider. This includes any fiber supplements. During bowel movements: Practice deep breathing while relaxing the lower abdomen. Practice pelvic floor relaxation. Watch your condition for any changes. Let your health care provider know about them. Keep all follow-up visits as told by your health care provider. This is important. Contact a health care provider if: You have pain that gets worse. You have a fever. You do not have a bowel movement after 4 days. You vomit. You are not hungry or you lose weight. You are bleeding from the opening between the buttocks (anus). You have thin, pencil-like stools. Get help right away if: You have a fever and your symptoms suddenly get worse. You leak stool or have blood in your stool. Your abdomen is bloated. You have severe pain in your abdomen. You feel dizzy or  you faint. Summary Constipation is when a person has fewer than three bowel movements in a week, has difficulty having a bowel movement, or has stools (feces) that are dry, hard, or larger than normal. Eat foods that have a lot of fiber, such as beans, whole grains, and fresh fruits and vegetables. Drink enough fluid to keep your urine pale yellow. Take over-the-counter and prescription medicines only as told by your health care provider. This includes any fiber supplements. This information is not intended to replace advice given to you by your health care provider. Make sure you discuss any questions you have with your health care provider. Document Revised: 10/29/2022 Document Reviewed: 10/29/2022 Elsevier Patient Education  2024 [employer].

## 2023-10-31 LAB — CBC WITH DIFFERENTIAL/PLATELET
Absolute Lymphocytes: 1496 {cells}/uL (ref 850–3900)
Absolute Monocytes: 561 {cells}/uL (ref 200–950)
Basophils Absolute: 51 {cells}/uL (ref 0–200)
Basophils Relative: 0.6 %
Eosinophils Absolute: 60 {cells}/uL (ref 15–500)
Eosinophils Relative: 0.7 %
HCT: 38.2 % (ref 35.0–45.0)
Hemoglobin: 12.4 g/dL (ref 11.7–15.5)
MCH: 29.7 pg (ref 27.0–33.0)
MCHC: 32.5 g/dL (ref 32.0–36.0)
MCV: 91.4 fL (ref 80.0–100.0)
MPV: 10.3 fL (ref 7.5–12.5)
Monocytes Relative: 6.6 %
Neutro Abs: 6333 {cells}/uL (ref 1500–7800)
Neutrophils Relative %: 74.5 %
Platelets: 256 10*3/uL (ref 140–400)
RBC: 4.18 10*6/uL (ref 3.80–5.10)
RDW: 12.7 % (ref 11.0–15.0)
Total Lymphocyte: 17.6 %
WBC: 8.5 10*3/uL (ref 3.8–10.8)

## 2023-10-31 LAB — COMPLETE METABOLIC PANEL WITH GFR
AG Ratio: 2.1 (calc) (ref 1.0–2.5)
ALT: 9 U/L (ref 6–29)
AST: 15 U/L (ref 10–35)
Albumin: 4.7 g/dL (ref 3.6–5.1)
Alkaline phosphatase (APISO): 53 U/L (ref 37–153)
BUN: 17 mg/dL (ref 7–25)
CO2: 30 mmol/L (ref 20–32)
Calcium: 10.4 mg/dL (ref 8.6–10.4)
Chloride: 100 mmol/L (ref 98–110)
Creat: 0.96 mg/dL (ref 0.50–1.05)
Globulin: 2.2 g/dL (ref 1.9–3.7)
Glucose, Bld: 100 mg/dL — ABNORMAL HIGH (ref 65–99)
Potassium: 4.6 mmol/L (ref 3.5–5.3)
Sodium: 139 mmol/L (ref 135–146)
Total Bilirubin: 0.5 mg/dL (ref 0.2–1.2)
Total Protein: 6.9 g/dL (ref 6.1–8.1)
eGFR: 64 mL/min/{1.73_m2} (ref >=60)

## 2023-10-31 LAB — URINALYSIS, ROUTINE W REFLEX MICROSCOPIC
Bacteria, UA: NONE SEEN /[HPF]
Bilirubin Urine: NEGATIVE
Glucose, UA: NEGATIVE
Hgb urine dipstick: NEGATIVE
Nitrite: NEGATIVE
RBC / HPF: NONE SEEN /[HPF] (ref 0–2)
Specific Gravity, Urine: 1.025 (ref 1.001–1.035)
pH: 6 (ref 5.0–8.0)

## 2023-10-31 LAB — MAGNESIUM: Magnesium: 2.2 mg/dL (ref 1.5–2.5)

## 2023-10-31 LAB — LIPID PANEL
Cholesterol: 209 mg/dL — ABNORMAL HIGH (ref <200)
HDL: 77 mg/dL (ref >=50)
LDL Cholesterol (Calc): 114 mg/dL — ABNORMAL HIGH
Non-HDL Cholesterol (Calc): 132 mg/dL — ABNORMAL HIGH (ref <130)
Total CHOL/HDL Ratio: 2.7 (calc) (ref <5.0)
Triglycerides: 85 mg/dL (ref <150)

## 2023-10-31 LAB — HEMOGLOBIN A1C W/OUT EAG: Hgb A1c MFr Bld: 6 %{Hb} — ABNORMAL HIGH (ref <5.7)

## 2023-10-31 LAB — VITAMIN D 25 HYDROXY (VIT D DEFICIENCY, FRACTURES): Vit D, 25-Hydroxy: 75 ng/mL (ref 30–100)

## 2023-10-31 LAB — TSH: TSH: 0.92 m[IU]/L (ref 0.40–4.50)

## 2023-11-03 LAB — WET PREP BY MOLECULAR PROBE
Candida species: NOT DETECTED
Gardnerella vaginalis: NOT DETECTED
MICRO NUMBER:: 15677587
SPECIMEN QUALITY:: ADEQUATE
Trichomonas vaginosis: NOT DETECTED

## 2023-11-15 DIAGNOSIS — M5432 Sciatica, left side: Secondary | ICD-10-CM | POA: Diagnosis not present

## 2023-11-15 DIAGNOSIS — M545 Low back pain, unspecified: Secondary | ICD-10-CM | POA: Diagnosis not present

## 2023-11-29 DIAGNOSIS — M545 Low back pain, unspecified: Secondary | ICD-10-CM | POA: Diagnosis not present

## 2023-11-29 DIAGNOSIS — M5432 Sciatica, left side: Secondary | ICD-10-CM | POA: Diagnosis not present

## 2023-12-10 ENCOUNTER — Other Ambulatory Visit: Payer: Self-pay | Admitting: Nurse Practitioner

## 2024-01-19 DIAGNOSIS — R051 Acute cough: Secondary | ICD-10-CM | POA: Diagnosis not present

## 2024-01-19 DIAGNOSIS — R0981 Nasal congestion: Secondary | ICD-10-CM | POA: Diagnosis not present

## 2024-01-19 DIAGNOSIS — R070 Pain in throat: Secondary | ICD-10-CM | POA: Diagnosis not present

## 2024-01-27 NOTE — Progress Notes (Signed)
 Assessment/Plan:   1.  Parkinsons Disease, diagnosed March, 2022  -continue carbidopa /levodopa  25/100, 1 at 7am/11am/4pm.  Discussed increasing it in the future  -discussed exercise and exercises that are appropriate for her.  She wants to hold on PT and discuss it next time.  -nausea is better  -we discussed DA and may need to consider adding this in the future  -mild Parkinsons Disease dyskinesia - not bothersome to patient and no medication necessary   2.  Constipation  -linzess  caused urgency/incontinence  3.  LBP  -she is doing better but not fully recovered and wants to hold on PT  Subjective:   Jenna Miller was seen today in follow up for Parkinsons disease.  My previous records were reviewed prior to todays visit as well as outside records available to me.  Pt denies falls.  She has not any lightheadedness or near syncope.  She was describing some nausea last visit, but decided to hold off on changing to any other form of levodopa .  She is eating crackers with it and that seems to help.   She reports today that she feels some off period around 10:30am and she is not sure if it is a sugar crash or related to off.  She doesn't want to change medication for it as it is only  2 days per week and sometimes that will help.    She has been sick recently with flu and is hanging onto the cough.  She is also dealing with the sciatica that started acting up in November.  She has not been exercising as much because of that but this is slowly getting better.    Current prescribed movement disorder medications: Carbidopa /levodopa  25/100, 1 tablet 3 times per day    PREVIOUS MEDICATIONS: Sinemet   ALLERGIES:   Allergies  Allergen Reactions   Lipitor [Atorvastatin] Anaphylaxis and Swelling    Other reaction(s): Other (See Comments) Unknown   Effexor [Venlafaxine]     Other reaction(s): Other (See Comments)   Paxil [Paroxetine Hcl]     Other reaction(s): Other (See Comments)    Wellbutrin [Bupropion] Hives    CURRENT MEDICATIONS:  Outpatient Encounter Medications as of 02/02/2024  Medication Sig   BIOTIN 5000 PO Take by mouth. Skin, hair and nails   Cholecalciferol (VITAMIN D3 PO) Take 5,000 mg by mouth daily.   ezetimibe  (ZETIA ) 10 MG tablet TAKE 1 TABLET BY MOUTH EVERY DAY FOR CHOLESTEROL   famotidine  (PEPCID ) 40 MG tablet TAKE 1 TABLET 2 X /DAY TO PREVENT HEARTBURN & INDIGESTION   fluticasone  (FLONASE ) 50 MCG/ACT nasal spray SPRAY 2 SPRAYS INTO EACH NOSTRIL EVERY DAY (Patient taking differently: as needed. SPRAY 2 SPRAYS INTO EACH NOSTRIL EVERY DAY)   linaclotide  (LINZESS ) 72 MCG capsule Take 1 capsule (72 mcg total) by mouth daily before breakfast.   Methylcobalamin (B12-ACTIVE) 1 MG CHEW Chew 1 each by mouth daily.   [DISCONTINUED] carbidopa -levodopa  (SINEMET  IR) 25-100 MG tablet Take 1 tablet by mouth 3 (three) times daily.   carbidopa -levodopa  (SINEMET  IR) 25-100 MG tablet Take 1 tablet by mouth 3 (three) times daily. 7am/11am/4pm   No facility-administered encounter medications on file as of 02/02/2024.    Objective:   PHYSICAL EXAMINATION:    VITALS:   Vitals:   02/02/24 0800  BP: 116/68  Pulse: 81  SpO2: 98%  Weight: 183 lb 3.2 oz (83.1 kg)  Height: 5' 2 (1.575 m)    GEN:  The patient appears stated age and is in  NAD. HEENT:  Normocephalic, atraumatic.  The mucous membranes are moist. The superficial temporal arteries are without ropiness or tenderness. CV:  RRR Lungs:  CTAB Neck/HEME:  There are no carotid bruits bilaterally.  Neurological examination:  Orientation: The patient is alert and oriented x3. Cranial nerves: There is good facial symmetry with facial hypomimia. The speech is fluent and clear. Soft palate rises symmetrically and there is no tongue deviation. Hearing is intact to conversational tone. Sensation: Sensation is intact to light touch throughout Motor: Strength is at least antigravity x4.  Movement  examination: Tone: There is mild increased tone in the LUE Abnormal movements:there is rare dyskinesia in the bilateral feet Coordination:  There is mild decremation on the L>R and LE>UE Gait and Station: The patient has no difficulty arising out of a deep-seated chair without the use of the hands. The patient's stride length is good   I have reviewed and interpreted the following labs independently    Chemistry      Component Value Date/Time   NA 139 10/30/2023 0947   K 4.6 10/30/2023 0947   CL 100 10/30/2023 0947   CO2 30 10/30/2023 0947   BUN 17 10/30/2023 0947   CREATININE 0.96 10/30/2023 0947      Component Value Date/Time   CALCIUM  10.4 10/30/2023 0947   ALKPHOS 52 02/20/2017 0934   AST 15 10/30/2023 0947   ALT 9 10/30/2023 0947   BILITOT 0.5 10/30/2023 0947       Lab Results  Component Value Date   WBC 8.5 10/30/2023   HGB 12.4 10/30/2023   HCT 38.2 10/30/2023   MCV 91.4 10/30/2023   PLT 256 10/30/2023    Lab Results  Component Value Date   TSH 0.92 10/30/2023    Total time spent on today's visit was 30 minutes, including both face-to-face time and nonface-to-face time.  Time included that spent on review of records (prior notes available to me/labs/imaging if pertinent), discussing treatment and goals, answering patient's questions and coordinating care.    Cc:  Tonita Fallow, MD

## 2024-02-02 ENCOUNTER — Ambulatory Visit (INDEPENDENT_AMBULATORY_CARE_PROVIDER_SITE_OTHER): Payer: Medicare HMO | Admitting: Neurology

## 2024-02-02 ENCOUNTER — Encounter: Payer: Self-pay | Admitting: Neurology

## 2024-02-02 VITALS — BP 116/68 | HR 81 | Ht 62.0 in | Wt 183.2 lb

## 2024-02-02 DIAGNOSIS — G20B1 Parkinson's disease with dyskinesia, without mention of fluctuations: Secondary | ICD-10-CM | POA: Diagnosis not present

## 2024-02-02 MED ORDER — CARBIDOPA-LEVODOPA 25-100 MG PO TABS: 1.0000 | ORAL_TABLET | Freq: Three times a day (TID) | ORAL | 1 refills | Status: DC

## 2024-02-02 NOTE — Patient Instructions (Signed)
 Local and Online Resources for Power over Parkinson's Group?  January 2025 ?  LOCAL [place] PARKINSON'S GROUPS??  Power over Parkinson's Group:???  Upcoming Power over [employer] Meetings/Care Partner Support:? 2nd Wednesdays of the month at 2 pm:  January 8th, February 12th Contact Lynwood Dawley at [email or web address] or Amy Marriott at [email or web address] if interested in participating in this group?  ?  LOCAL EVENTS AND NEW OFFERINGS?  Dance Project Spring 2025:  January 14-May 20, Tuesdays 10-11 am.  All details on website: [email or web address].is ACT FITNESS Chair Yoga classes "Train and Gain", Fridays 10 am, [employer].  Contact Gina at [phone].   PWR! Moves Paint class!  Wednesdays at 10 am.  Please contact Lonia Blood, PT at [email or web address] if interested. Health visitor Classes offering at [employer]!? Tuesdays (Chair Yoga)  and Wednesdays (PWR! Moves)  1:00 pm.?? Contact Aldona Lento [phone] or [email or web address] Drumming for Parkinson's will be held on 2nd and 4th Mondays at 11:00 am.?? Located at the [place] of the [place] ([address] [place]. [place].) *Next class is January 13th.? Contact Albertina Parr at [email or web address] or [phone]?  [facility] Parkinson's Tai Chi Class, Mondays at 11 am.  Call [phone] for details  [facility] at [facility]- [address], [place] Wednesdays- 4:00 - 5:00 PM - specifically for Parkinson's Disease.  Free!  Contact Denny Peon, [place] - [phone] (clinic) or  [phone] (cell) or by email: [email or web address]   ?ONLINE EDUCATION AND SUPPORT?  Parkinson Foundation:? www.parkinson.org?  PD Health at Home continues:? Mindfulness Mondays, Wellness Wednesdays, Fitness Fridays??  Upcoming Education:??  Empowerment through Movement, Wednesday,  January 15th, 1-2 pm A Deep Dive into Deep Brain Stimulation (DBS), Wednesday, January 29th, 1-2 pm Expert Briefing:    Stay tuned Register for virtual education and expert briefings (webinars) at [employer].gl  Please check out their website to sign up for emails and see their full online offerings??  ?  [employer] Foundation:? www.michaeljfox.org??  Third Thursday Webinars:? On the third Thursday of every month at 12 p.m. ET, join our free live webinars to learn about various aspects of living with Parkinson's disease and our work to speed medical breakthroughs.?  Upcoming Webinar:? Managing the Hidden Symptoms:  Mood and Motivation Changes in Parkinson's.  Thursday, January 16th at 12 noon.  Check out additional information on their website to see their full online offerings?  ?  [employer]:? www.[email or web address].org?  Upcoming Webinar:   Stay tuned Series:? Living with Parkinson's Meetup.?? Third Thursdays each month, 3 pm?  Care Partner Monthly Meetup.? With Jillene Bucks [place].? First Tuesday of each month, 2 pm?  Check out additional information to Live Well Today on their website?  ?  Parkinson and Movement Disorders (PMD) Alliance:? www.pmdalliance.org?  NeuroLife Online:? Online Education Events?  Sign up for emails, which are sent weekly to give you updates on programming and online offerings?  ?  Parkinson's Association of the [place]:? www.parkinsonassociation.org?  Information on online support groups, education events, and online exercises including Yoga, Parkinson's exercises and more-LOTS of information on links to PD resources and online events?  Virtual Support Group through [employer] of the [place]-First Wednesday of each month at 2 pm   MOVEMENT AND EXERCISE OPPORTUNITIES?  PWR! Moves [place] class has returned!  Wednesdays at 10 am.  Please contact Lonia Blood, PT at  [email or web address] if interested. Parkinson's Exercise Class offerings at [employer]. Tuesdays (Chair yoga) and Wednesdays (PWR! Moves)  1:00  pm.?  Contact Aldona Lento [phone] or [email or web address]  Parkinson's Wellness Recovery (PWR! Moves)? www.pwr4life.org?  Info on the PWR! Virtual Experience:? You will have access to our expertise?through self-assessment, guided plans that start with the PD-specific fundamentals, educational content, tips, Q&A with an expert, and a growing Engineering geologist of PD-specific pre-recorded and live exercise classes of varying types and intensity - both physical and cognitive! If that is not enough, we offer 1:1 wellness consultations (in-person or virtual) to personalize your PWR! Dance movement psychotherapist.??  Parkinson [employer] Fridays:??  As part of the PD Health @ Home program, this free video series focuses each week on one aspect of fitness designed to support people living with Parkinson's.? These weekly videos highlight the Parkinson Foundation fitness guidelines for people with Parkinson's disease.?  MenusLocal.com.br?  Dance for PD website is offering free, live-stream classes throughout the week, as well as links to [employer] of classes:? https://danceforparkinsons.org/?  Virtual dance and Pilates for Parkinson's classes: Click on the Community Tab> Parkinson's Movement Initiative Tab.? To register for classes and for more information, visit www.[email or web address].co.za and click the "community" tab.??  [employer] Parkinson's Cycling Classes??  [facility]:? Thursdays @ Noon-Live classes at [employer] ([employer] at [email or web address]?or [phone])?  [facility]: Classes Tuesday, Wednesday and Thursday (contact [place] at [email or web address] ?or [phone])?  [employer]?  Varied levels of classes are offered Tuesdays and  Thursdays at [facility]??  Stretching with Byrd Hesselbach weekly class is also offered for people with Parkinson's?  To observe a class or for more information, call [phone] or email Patricia Nettle at [email or web address]?    ADDITIONAL SUPPORT AND RESOURCES?  Well[employer]Spring [employer]:  Chiropractor:? www.well-springsolutions.org/caregiver-education/caregiver-support-group.? You may also contact Loleta Chance at [facility] -spring.org or [phone].????  Well-Spring Navigator:? Just1Navigator program, a?free service to help individuals and families through the journey of determining care for older adults.? The "Navigator" is a Child psychotherapist, Sidney Ace, who will speak with a prospective client and/or loved ones to provide an assessment of the situation and a set of recommendations for a personalized care plan -- all free of charge, and whether?Well[employer]Spring [employer] offers the needed service or not. If the need is not a service we provide, we are well-connected with reputable programs in town that we can refer you to.? www.well-springsolutions.org or to speak with the Navigator, call [phone].?

## 2024-02-15 ENCOUNTER — Other Ambulatory Visit: Payer: Self-pay | Admitting: Obstetrics and Gynecology

## 2024-02-15 DIAGNOSIS — Z1231 Encounter for screening mammogram for malignant neoplasm of breast: Secondary | ICD-10-CM

## 2024-02-16 ENCOUNTER — Other Ambulatory Visit: Payer: Self-pay

## 2024-02-16 DIAGNOSIS — R200 Anesthesia of skin: Secondary | ICD-10-CM

## 2024-02-24 ENCOUNTER — Encounter: Payer: Self-pay | Admitting: Neurology

## 2024-02-26 ENCOUNTER — Other Ambulatory Visit: Payer: Self-pay

## 2024-02-26 ENCOUNTER — Emergency Department (HOSPITAL_COMMUNITY)
Admission: EM | Admit: 2024-02-26 | Discharge: 2024-02-26 | Disposition: A | Discharge status: Home / Self Care | Payer: Medicare HMO | Attending: Emergency Medicine | Admitting: Emergency Medicine

## 2024-02-26 ENCOUNTER — Encounter (HOSPITAL_COMMUNITY): Payer: Self-pay

## 2024-02-26 DIAGNOSIS — K5900 Constipation, unspecified: Secondary | ICD-10-CM | POA: Diagnosis not present

## 2024-02-26 DIAGNOSIS — Z853 Personal history of malignant neoplasm of breast: Secondary | ICD-10-CM | POA: Diagnosis not present

## 2024-02-26 DIAGNOSIS — K649 Unspecified hemorrhoids: Secondary | ICD-10-CM | POA: Insufficient documentation

## 2024-02-26 DIAGNOSIS — R1084 Generalized abdominal pain: Secondary | ICD-10-CM | POA: Diagnosis present

## 2024-02-26 DIAGNOSIS — G20C Parkinsonism, unspecified: Secondary | ICD-10-CM | POA: Insufficient documentation

## 2024-02-26 DIAGNOSIS — G20B1 Parkinson's disease with dyskinesia, without mention of fluctuations: Secondary | ICD-10-CM

## 2024-02-26 LAB — CBC
HCT: 38.5 % (ref 36.0–46.0)
Hemoglobin: 12.5 g/dL (ref 12.0–15.0)
MCH: 30 pg (ref 26.0–34.0)
MCHC: 32.5 g/dL (ref 30.0–36.0)
MCV: 92.5 fL (ref 80.0–100.0)
Platelets: 213 10*3/uL (ref 150–400)
RBC: 4.16 MIL/uL (ref 3.87–5.11)
RDW: 13.3 % (ref 11.5–15.5)
WBC: 7.2 10*3/uL (ref 4.0–10.5)
nRBC: 0 % (ref 0.0–0.2)

## 2024-02-26 LAB — COMPREHENSIVE METABOLIC PANEL
ALT: 6 U/L (ref 0–44)
AST: 18 U/L (ref 15–41)
Albumin: 4.2 g/dL (ref 3.5–5.0)
Alkaline Phosphatase: 42 U/L (ref 38–126)
Anion gap: 11 (ref 5–15)
BUN: 16 mg/dL (ref 8–23)
CO2: 23 mmol/L (ref 22–32)
Calcium: 9.6 mg/dL (ref 8.9–10.3)
Chloride: 105 mmol/L (ref 98–111)
Creatinine, Ser: 0.92 mg/dL (ref 0.44–1.00)
GFR, Estimated: >60 mL/min (ref >60)
Glucose, Bld: 112 mg/dL — ABNORMAL HIGH (ref 70–99)
Potassium: 4 mmol/L (ref 3.5–5.1)
Sodium: 139 mmol/L (ref 135–145)
Total Bilirubin: 0.6 mg/dL (ref 0.0–1.2)
Total Protein: 6.6 g/dL (ref 6.5–8.1)

## 2024-02-26 LAB — LIPASE, BLOOD: Lipase: 29 U/L (ref 11–51)

## 2024-02-26 MED ORDER — POLYETHYLENE GLYCOL 3350 17 G PO PACK
17.0000 g | PACK | Freq: Every day | ORAL | Status: DC
  Administered 2024-02-26: 17 g via ORAL

## 2024-02-26 MED ORDER — POLYETHYLENE GLYCOL 3350 17 G PO PACK: 17.0000 g | PACK | Freq: Every day | ORAL | 0 refills | Status: AC | PRN

## 2024-02-26 MED ORDER — HYDROCORTISONE (PERIANAL) 2.5 % EX CREA: 1.0000 | TOPICAL_CREAM | Freq: Two times a day (BID) | CUTANEOUS | 0 refills | Status: DC

## 2024-02-26 NOTE — ED Triage Notes (Signed)
 Pt states she feels like she is impacted. Pt's last BM was yesterday. Pt states she is always irregular but doesn't usually have this pain. Pt denies nausea or vomiting.

## 2024-02-26 NOTE — ED Provider Notes (Signed)
 [place] EMERGENCY DEPARTMENT AT [facility] Provider Note   CSN: [id number] Arrival date & time: 02/26/24  0847     History  Chief Complaint  Patient presents with   Abdominal Pain    Jenna Miller is a 68 y.o. female.  Pt is a 67 yo female with pmhx significant for Parkinson's Disease, hld, breast cancer, gerd, and constipation.  Pt said constipation has been worse since she has been on her Parkinson's medication, but it's been helping her Parkinson's.  She has been taking a stool softener, but it is not helping.  She came in today because she feels like she's impacted.  Prior to coming back, pt said her husband helped her to manually disimpact and she's feeling better.  She had to do a lot pushing and has some soreness.       Home Medications Prior to Admission medications   Medication Sig Start Date End Date Taking? Authorizing Provider  hydrocortisone (ANUSOL-HC) 2.5 % rectal cream Place 1 Application rectally 2 (two) times daily. 02/26/24  Yes Jacalyn Lefevre, MD  polyethylene glycol (MIRALAX) 17 g packet Take 17 g by mouth daily as needed for moderate constipation. 02/26/24  Yes Jacalyn Lefevre, MD  BIOTIN 5000 PO Take by mouth. Skin, hair and nails    [provider]  carbidopa-levodopa (SINEMET IR) 25-100 MG tablet Take 1 tablet by mouth 3 (three) times daily. 7am/11am/4pm 02/02/24   Tat, Octaviano Batty, DO  Cholecalciferol (VITAMIN D3 PO) Take 5,000 mg by mouth daily.    [provider]  ezetimibe (ZETIA) 10 MG tablet TAKE 1 TABLET BY MOUTH EVERY DAY FOR CHOLESTEROL 12/10/23   Raynelle Dick, NP  famotidine (PEPCID) 40 MG tablet TAKE 1 TABLET 2 X /DAY TO PREVENT HEARTBURN & INDIGESTION 04/28/23   Raynelle Dick, NP  fluticasone (FLONASE) 50 MCG/ACT nasal spray SPRAY 2 SPRAYS INTO EACH NOSTRIL EVERY DAY Patient taking differently: as needed. SPRAY 2 SPRAYS INTO EACH NOSTRIL EVERY DAY 09/23/22   Adela Glimpse, NP  linaclotide (LINZESS) 72  MCG capsule Take 1 capsule (72 mcg total) by mouth daily before breakfast. 10/30/23   Raynelle Dick, NP  Methylcobalamin (B12-ACTIVE) 1 MG CHEW Chew 1 each by mouth daily.    [provider]      Allergies    Lipitor [atorvastatin], Effexor [venlafaxine], Paxil [paroxetine hcl], and Wellbutrin [bupropion]    Review of Systems   Review of Systems  Gastrointestinal:  Positive for constipation and rectal pain.  All other systems reviewed and are negative.   Physical Exam Updated Vital Signs BP 134/70   Pulse 80   Temp 97.9 F (36.6 C) (Oral)   Resp 18   Ht 5\' 1"  (1.549 m)   Wt 81.6 kg   LMP 06/10/2010   SpO2 100%   BMI 34.01 kg/m  Physical Exam Vitals and nursing note reviewed. Exam conducted with a chaperone present.  Constitutional:      Appearance: She is well-developed.  HENT:     Head: Normocephalic and atraumatic.     Mouth/Throat:     Mouth: Mucous membranes are moist.     Pharynx: Oropharynx is clear.  Eyes:     Extraocular Movements: Extraocular movements intact.     Pupils: Pupils are equal, round, and reactive to light.  Cardiovascular:     Rate and Rhythm: Normal rate and regular rhythm.     Heart sounds: Normal heart sounds.  Pulmonary:     Effort: Pulmonary  effort is normal.     Breath sounds: Normal breath sounds.  Abdominal:     General: Abdomen is flat. Bowel sounds are normal.     Palpations: Abdomen is soft.     Tenderness: There is generalized abdominal tenderness.  Genitourinary:    Rectum: External hemorrhoid present.     Comments: No stool in rectal vault Skin:    General: Skin is warm.     Capillary Refill: Capillary refill takes less than 2 seconds.  Neurological:     General: No focal deficit present.     Mental Status: She is alert and oriented to person, place, and time.  Psychiatric:        Mood and Affect: Mood normal.        Behavior: Behavior normal.     ED Results / Procedures / Treatments   Labs (all labs  ordered are listed, but only abnormal results are displayed) Labs Reviewed  COMPREHENSIVE METABOLIC PANEL - Abnormal; Notable for the following components:      Result Value   Glucose, Bld 112 (*)    All other components within normal limits  LIPASE, BLOOD  CBC  URINALYSIS, ROUTINE W REFLEX MICROSCOPIC    EKG None  Radiology No results found.  Procedures Procedures    Medications Ordered in ED Medications  polyethylene glycol (MIRALAX / GLYCOLAX) packet 17 g (17 g Oral Given 02/26/24 1103)    ED Course/ Medical Decision Making/ A&P                                 Medical Decision Making Amount and/or Complexity of Data Reviewed Labs: ordered.  Risk OTC drugs.   This patient presents to the ED for concern of fecal impaction, this involves an extensive number of treatment options, and is a complaint that carries with it a high risk of complications and morbidity.  The differential diagnosis includes impaction, constipation   Co morbidities that complicate the patient evaluation  Parkinson's Disease, hld, breast cancer, gerd, and constipation   Additional history obtained:  Additional history obtained from epic chart review External records from outside source obtained and reviewed including husband   Lab Tests:  I Ordered, and personally interpreted labs.  The pertinent results include:  cbc nl, cmp nl, lip nl  Medicines ordered and prescription drug management:  I ordered medication including miralax  for sx  Reevaluation of the patient after these medicines showed that the patient improved I have reviewed the patients home medicines and have made adjustments as needed  Problem List / ED Course:  Fecal impaction:  resolved by the time pt came to the room.  Pt d/c with miralax and instructed to eat a high fiber diet.  She is to return if worse.  F/u with pcp.   Reevaluation:  After the interventions noted above, I reevaluated the patient and found  that they have :improved   Social Determinants of Health:  Lives at home   Dispostion:  After consideration of the diagnostic results and the patients response to treatment, I feel that the patent would benefit from discharge with outpatient f/u.          Final Clinical Impression(s) / ED Diagnoses Final diagnoses:  Constipation, unspecified constipation type  Hemorrhoids, unspecified hemorrhoid type    Rx / DC Orders ED Discharge Orders          Ordered    polyethylene glycol (  MIRALAX) 17 g packet  Daily PRN        02/26/24 1050    hydrocortisone (ANUSOL-HC) 2.5 % rectal cream  2 times daily        02/26/24 1055              Jacalyn Lefevre, MD 02/26/24 1106

## 2024-03-10 ENCOUNTER — Ambulatory Visit
Admission: RE | Admit: 2024-03-10 | Discharge: 2024-03-10 | Disposition: A | Discharge status: Home / Self Care | Payer: Medicare HMO | Source: Ambulatory Visit | Attending: Obstetrics and Gynecology | Admitting: Obstetrics and Gynecology

## 2024-03-10 DIAGNOSIS — Z1231 Encounter for screening mammogram for malignant neoplasm of breast: Secondary | ICD-10-CM

## 2024-03-16 ENCOUNTER — Telehealth: Payer: Self-pay | Admitting: Orthopedic Surgery

## 2024-03-16 ENCOUNTER — Other Ambulatory Visit (INDEPENDENT_AMBULATORY_CARE_PROVIDER_SITE_OTHER): Payer: Self-pay

## 2024-03-16 ENCOUNTER — Ambulatory Visit: Payer: Medicare HMO | Admitting: Orthopedic Surgery

## 2024-03-16 ENCOUNTER — Encounter: Payer: Self-pay | Admitting: Orthopedic Surgery

## 2024-03-16 DIAGNOSIS — M545 Low back pain, unspecified: Secondary | ICD-10-CM | POA: Diagnosis not present

## 2024-03-16 MED ORDER — TRAMADOL HCL 50 MG PO TABS: 50.0000 mg | ORAL_TABLET | Freq: Four times a day (QID) | ORAL | 0 refills | Status: DC | PRN

## 2024-03-16 MED ORDER — DIAZEPAM 10 MG PO TABS: 10.0000 mg | ORAL_TABLET | ORAL | 0 refills | Status: DC

## 2024-03-16 NOTE — Telephone Encounter (Signed)
 Patient states her pharmacy has not received the Rx for the pain medication that Dr August Saucer was to send in today. Pt request a call back.

## 2024-03-16 NOTE — Telephone Encounter (Signed)
 Hi Jenna Miller.  I do not think I have seen her in about 4 years.  Am I missing something?

## 2024-03-16 NOTE — Telephone Encounter (Signed)
 Per Dr August Saucer medication sent.

## 2024-03-17 ENCOUNTER — Ambulatory Visit
Admission: RE | Admit: 2024-03-17 | Discharge: 2024-03-17 | Disposition: A | Discharge status: Home / Self Care | Source: Ambulatory Visit | Attending: Orthopedic Surgery | Admitting: Orthopedic Surgery

## 2024-03-17 ENCOUNTER — Encounter: Payer: Self-pay | Admitting: Orthopedic Surgery

## 2024-03-17 DIAGNOSIS — M545 Low back pain, unspecified: Secondary | ICD-10-CM

## 2024-03-17 NOTE — Progress Notes (Unsigned)
 Office Visit Note   Patient: Jenna Miller           Date of Birth: Apr 04, 1956           MRN: [id number] Visit Date: 03/16/2024 Requested by: Lucky Cowboy, MD [address] [place],  [place] [address] PCP: Garnette Gunner, MD  Subjective: Chief Complaint  Patient presents with   Lower Back - Pain   Right Leg - Pain    HPI: Jenna Miller is a 68 y.o. female who presents to the office reporting right leg pain that radiates from her hip.  She does have some trouble walking.  She does use a cane.  Had a "sciatic episode" in November.  Really hard for her to walk this past Sunday.  She works as an Research scientist (medical) at [employer].  Has a history of breast cancer 20 years ago.  Also has had Parkinson's for 3 years but takes oral medication for that.  Does report some sharp pains in the trochanteric region..                ROS: All systems reviewed are negative as they relate to the chief complaint within the history of present illness.  Patient denies fevers or chills.  Assessment & Plan: Visit Diagnoses:  1. Lumbar pain     Plan: Impression is radiculopathy in a patient who cannot have an MRI scan.  She does have Parkinson's which may be complicating this to some degree.  Radiographs of the back do show some lower lumbar level arthritis.  Visualized hips without much arthritis.  Plan is tramadol and CT scan of the lumbar spine as a preamble to epidural steroid injections.  At the time of this dictation the CT scan of the lumbar spine does show degenerative changes.  We will send her to Dr. Alvester Morin for ESI's.  Follow-Up Instructions: No follow-ups on file.   Orders:  Orders Placed This Encounter  Procedures   XR Lumbar Spine 2-3 Views   CT LUMBAR SPINE WO CONTRAST   Meds ordered this encounter  Medications   traMADol (ULTRAM) 50 MG tablet    Sig: Take 1 tablet (50 mg total) by mouth every 6 (six) hours as needed.    Dispense:  30 tablet     Refill:  0   diazepam (VALIUM) 10 MG tablet    Sig: Take 1 tablet (10 mg total) by mouth See admin instructions. Take 1 30 minutes before MRI scan for anxiety.    Dispense:  8 tablet    Refill:  0      Procedures: No procedures performed   Clinical Data: No additional findings.  Objective: Vital Signs: LMP 06/10/2010   Physical Exam:  Constitutional: Patient appears well-developed HEENT:  Head: Normocephalic Eyes:EOM are normal Neck: Normal range of motion Cardiovascular: Normal rate Pulmonary/chest: Effort normal Neurologic: Patient is alert Skin: Skin is warm Psychiatric: Patient has normal mood and affect  Ortho Exam: Ortho exam demonstrates gait pattern consistent with Parkinson's.  No nerve root tension signs.  5 out of 5 ankle dorsiflexion plantarflexion quad hamstring strength.  Pedal pulses palpable.  No real trochanteric tenderness is present.  No groin pain with internal/external Tatian of the leg.  Specialty Comments:  No specialty comments available.  Imaging: No results found.   PMFS History: Patient Active Problem List   Diagnosis Date Noted   Arm pain 10/10/2022   Parkinson's disease (HCC) 03/19/2021   History of adenomatous  polyp of colon 02/14/2021   Dysfunction of right eustachian tube 08/07/2020   Constipation 02/09/2019   CKD Stage 2 (GFR 69 ml/min) 09/22/2014   Medication management 09/22/2014   Pre-diabetes 09/22/2014   Morbid Obesity (BMI 32.54) 09/22/2014   Breast cancer, Left 09/22/2014   GERD    Vitamin D deficiency    Fatty liver    Hypertension    Hyperlipidemia    Past Medical History:  Diagnosis Date   Acid reflux    Arthritis    Cancer (HCC)    Left Breast   Complication of anesthesia    woke  up X 1   Fatty liver    High cholesterol    Hypertension    Spider bite 2016   Vitamin D deficiency     Family History  Problem Relation Age of Onset   Hypertension Mother    Hyperlipidemia Mother    Gout Father 16        trauma   Cancer Cousin        lung   Hyperlipidemia Brother    Hypertension Brother    COPD Sister     Past Surgical History:  Procedure Laterality Date   BREAST SURGERY  1995   mastectomy left   CHOLECYSTECTOMY  11/19/2012   Procedure: LAPAROSCOPIC CHOLECYSTECTOMY WITH INTRAOPERATIVE CHOLANGIOGRAM;  Surgeon: Liz Malady, MD;  Location: [facility] OR;  Service: General;  Laterality: N/A;   HERNIA REPAIR     KNEE ARTHROSCOPY  1982   MASTECTOMY Left 1995   TRAM  1999-2000   WISDOM TOOTH EXTRACTION     Social History   Occupational History   Not on file  Tobacco Use   Smoking status: Never   Smokeless tobacco: Never  Vaping Use   Vaping status: Never Used  Substance and Sexual Activity   Alcohol use: Yes    Alcohol/week: 1.0 standard drink of alcohol    Types: 1 Cans of beer per week    Comment: rare   Drug use: No   Sexual activity: Not on file

## 2024-03-18 ENCOUNTER — Other Ambulatory Visit: Payer: Self-pay

## 2024-03-18 DIAGNOSIS — M545 Low back pain, unspecified: Secondary | ICD-10-CM

## 2024-03-21 NOTE — Telephone Encounter (Signed)
 I called her.  She is up for an injection in her back but it has to be first thing in the morning or  lasting in the afternoon.  Thanks

## 2024-03-23 ENCOUNTER — Other Ambulatory Visit: Payer: Self-pay | Admitting: Physical Medicine and Rehabilitation

## 2024-03-23 MED ORDER — DIAZEPAM 5 MG PO TABS: ORAL_TABLET | ORAL | 0 refills | Status: DC

## 2024-03-29 ENCOUNTER — Ambulatory Visit (INDEPENDENT_AMBULATORY_CARE_PROVIDER_SITE_OTHER): Payer: Medicare HMO | Admitting: Family Medicine

## 2024-03-29 ENCOUNTER — Encounter: Payer: Self-pay | Admitting: Family Medicine

## 2024-03-29 VITALS — BP 122/64 | HR 83 | Temp 97.7°F | Ht 61.0 in | Wt 181.2 lb

## 2024-03-29 DIAGNOSIS — K5903 Drug induced constipation: Secondary | ICD-10-CM

## 2024-03-29 DIAGNOSIS — E782 Mixed hyperlipidemia: Secondary | ICD-10-CM

## 2024-03-29 DIAGNOSIS — R7303 Prediabetes: Secondary | ICD-10-CM | POA: Diagnosis not present

## 2024-03-29 DIAGNOSIS — M545 Low back pain, unspecified: Secondary | ICD-10-CM | POA: Insufficient documentation

## 2024-03-29 DIAGNOSIS — K219 Gastro-esophageal reflux disease without esophagitis: Secondary | ICD-10-CM

## 2024-03-29 DIAGNOSIS — G8929 Other chronic pain: Secondary | ICD-10-CM

## 2024-03-29 DIAGNOSIS — G20B2 Parkinson's disease with dyskinesia, with fluctuations: Secondary | ICD-10-CM | POA: Diagnosis not present

## 2024-03-29 NOTE — Progress Notes (Signed)
 Assessment/Plan:    Assessment & Plan Lumbar Spine Pain with Bone Spurs Chronic lumbar spine pain due to bone spurs, causing severe discomfort and mobility issues. Pain is described as severe, likened to being hit with a bolt of lightning. Scheduled for a lumbar nerve block with steroid injection to alleviate pain and improve mobility. - Proceed with lumbar nerve block and steroid injection as scheduled - Discuss potential benefits and risks of the procedure with her  Parkinson's Disease Parkinson's disease contributing to mobility issues. Under the care of neurologist Dr. Arcelia Jew. Focused on managing symptoms to improve quality of life.  Chronic Constipation Chronic constipation likely exacerbated by Parkinson's medications. Previous use of Linzess was discontinued due to adverse effects. Reports daily bowel movements in small amounts. Prefers Miralax for its safety and effectiveness in maintaining bowel regularity without causing accidents. - Restart Miralax and monitor effectiveness - Refer to gastroenterology for further evaluation and management - Schedule colonoscopy for fall due to history of polyps  Gastroesophageal Reflux Disease (GERD) Chronic heartburn managed with famotidine and occasional Tums. Symptoms are stable. Reports frequent acid reflux, possibly due to esophageal sphincter issues. - Continue famotidine and Tums as needed - Monitor symptoms and adjust treatment if necessary  Pre-diabetes A1c levels around 5.6-5.7. Declined metformin in favor of lifestyle modifications. Focus on managing glucose levels through diet and exercise. - Monitor A1c levels - Encourage lifestyle modifications to manage glucose levels  Hyperlipidemia Hyperlipidemia managed with Zetia due to adverse reaction to statins. Condition is well-managed with current treatment. - Continue Zetia - Monitor lipid levels  General Health Maintenance Interested in understanding lab results and improving  overall health. Emphasized importance of clear communication regarding lab results and health status. - Schedule fasting lab work in a few months - Ensure clear communication of lab results and health status  Follow-up Establishing care and requires follow-up for multiple conditions. Plans to coordinate care and address ongoing health issues. - Schedule follow-up appointment in approximately three months - Coordinate with pharmacy for medication refills as needed      There are no discontinued medications.  Return in about 3 months (around 06/28/2024) for physical (fasting labs).    Subjective:   Encounter date: 03/29/2024  Jenna Miller is a 68 y.o. female who has Hypertension; Hyperlipidemia; GERD; Vitamin D deficiency; Fatty liver; CKD Stage 2 (GFR 69 ml/min); Medication management; Pre-diabetes; Morbid Obesity (BMI 32.54); Breast cancer, Left; Constipation; Dysfunction of right eustachian tube; History of adenomatous polyp of colon; Parkinson's disease (HCC); Arm pain; and Chronic midline low back pain on their problem list..   She  has a past medical history of Acid reflux, Arthritis, Cancer (HCC), Cataract, Complication of anesthesia, Fatty liver, High cholesterol, Hypertension, Spider bite (2016), and Vitamin D deficiency..   She presents with chief complaint of Establish Care .   Discussed the use of AI scribe software for clinical note transcription with the patient, who gave verbal consent to proceed.  History of Present Illness Jenna Miller is a 68 year old female with Parkinson's disease and lumbar spine issues who presents to establish care after the passing of her previous physician.  She has been experiencing multiple health issues over the past few years and seeks to improve her understanding and management of her medical conditions.  She was diagnosed with Parkinson's disease in the past few years, which has been a significant concern. She is under the  care of her neurologist for this condition. While Parkinson's cannot be cured,  she is interested in managing it better.  She reports severe lumbar spine pain due to bone spurs, which has significantly impacted her mobility, necessitating the use of a walking stick. She describes the pain as feeling like 'a bolt of lightning' and states it is the most severe pain she has experienced, even more so than her past breast cancer. She has been dealing with this back issue since November and is scheduled for a lumbar steroid injection next Monday to alleviate the pain. Her orthopedist has referred her to another specialist for this procedure.  She experiences chronic constipation, which she attributes to her Parkinson's medications. She had a significant episode in February that required an emergency room visit. She previously used Linzess, which initially worked but later caused uncontrollable diarrhea, leading her to discontinue it. She has used Miralax in the past and plans to resume it as it was effective without causing accidents.  She has been managing her blood sugar levels, with her A1c hovering around 5.6 to 5.7. She has not taken metformin despite previous recommendations, as she believes her levels are manageable without it. She identifies as a 'chocoholic' and is aware of the need to monitor her A1c levels.  She has a history of higher cholesterol and is currently taking Zetia. She previously experienced throat swelling with a statin, which led to the switch to Zetia.  She experiences heartburn and takes famotidine twice daily, supplemented with Tums as needed for quick relief. She suspects an issue with her esophageal sphincter but reports that her symptoms are stable.  She mentions difficulty with weight loss despite efforts, attributing it to various factors including her medical conditions and medications.     ROS   Past Surgical History:  Procedure Laterality Date   BREAST SURGERY   1995   mastectomy left   CHOLECYSTECTOMY  11/19/2012   Procedure: LAPAROSCOPIC CHOLECYSTECTOMY WITH INTRAOPERATIVE CHOLANGIOGRAM;  Surgeon: Liz Malady, MD;  Location: [facility] OR;  Service: General;  Laterality: N/A;   HERNIA REPAIR     KNEE ARTHROSCOPY  1982   MASTECTOMY Left 1995   TRAM  1999-2000   WISDOM TOOTH EXTRACTION      Outpatient Medications Prior to Visit  Medication Sig Dispense Refill   carbidopa-levodopa (SINEMET IR) 25-100 MG tablet Take 1 tablet by mouth 3 (three) times daily. 7am/11am/4pm 270 tablet 1   Cholecalciferol (VITAMIN D3 PO) Take 5,000 mg by mouth daily.     diazepam (VALIUM) 5 MG tablet Take one tablet by mouth with food one hour prior to procedure. May repeat 30 minutes prior if needed. 2 tablet 0   ezetimibe (ZETIA) 10 MG tablet TAKE 1 TABLET BY MOUTH EVERY DAY FOR CHOLESTEROL 90 tablet 3   famotidine (PEPCID) 40 MG tablet TAKE 1 TABLET 2 X /DAY TO PREVENT HEARTBURN & INDIGESTION 180 tablet 3   Methylcobalamin (B12-ACTIVE) 1 MG CHEW Chew 1 each by mouth daily.     polyethylene glycol (MIRALAX) 17 g packet Take 17 g by mouth daily as needed for moderate constipation. 30 each 0   BIOTIN 5000 PO Take by mouth. Skin, hair and nails     fluticasone (FLONASE) 50 MCG/ACT nasal spray SPRAY 2 SPRAYS INTO EACH NOSTRIL EVERY DAY (Patient not taking: Reported on 03/29/2024) 48 mL 1   hydrocortisone (ANUSOL-HC) 2.5 % rectal cream Place 1 Application rectally 2 (two) times daily. (Patient not taking: Reported on 03/29/2024) 30 g 0   linaclotide (LINZESS) 72 MCG capsule Take 1 capsule (72 mcg  total) by mouth daily before breakfast. (Patient not taking: Reported on 03/29/2024) 30 capsule 2   traMADol (ULTRAM) 50 MG tablet Take 1 tablet (50 mg total) by mouth every 6 (six) hours as needed. (Patient not taking: Reported on 03/29/2024) 30 tablet 0   No facility-administered medications prior to visit.    Family History  Problem Relation Age of Onset   Hypertension Mother     Hyperlipidemia Mother    Gout Father 15       trauma   Cancer Cousin        lung   Hyperlipidemia Brother    Hypertension Brother    COPD Sister     Social History   Socioeconomic History   Marital status: Divorced    Spouse name: Not on file   Number of children: Not on file   Years of education: Not on file   Highest education level: Some college, no degree  Occupational History   Not on file  Tobacco Use   Smoking status: Never   Smokeless tobacco: Never  Vaping Use   Vaping status: Never Used  Substance and Sexual Activity   Alcohol use: Not Currently    Alcohol/week: 1.0 standard drink of alcohol    Comment: rare   Drug use: No   Sexual activity: Not on file  Other Topics Concern   Not on file  Social History Narrative   Right handed    Social Drivers of Health   Financial Resource Strain: Patient Declined (03/28/2024)   Overall Financial Resource Strain (CARDIA)    Difficulty of Paying Living Expenses: Patient declined  Food Insecurity: Patient Declined (03/28/2024)   Hunger Vital Sign    Worried About Running Out of Food in the Last Year: Patient declined    Ran Out of Food in the Last Year: Patient declined  Transportation Needs: No Transportation Needs (03/28/2024)   PRAPARE - Administrator, Civil Service (Medical): No    Lack of Transportation (Non-Medical): No  Physical Activity: Insufficiently Active (03/28/2024)   Exercise Vital Sign    Days of Exercise per Week: 3 days    Minutes of Exercise per Session: 20 min  Stress: No Stress Concern Present (03/28/2024)   [employer] of Occupational Health - Occupational Stress Questionnaire    Feeling of Stress : Only a little  Social Connections: Unknown (03/28/2024)   Social Connection and Isolation Panel [NHANES]    Frequency of Communication with Friends and Family: Three times a week    Frequency of Social Gatherings with Friends and Family: Once a week    Attends Religious Services:  Patient declined    Database administrator or Organizations: No    Attends Engineer, structural: Not on file    Marital Status: Divorced  Intimate Partner Violence: Not on file                                                                                                  Objective:  Physical Exam: BP 122/64   Pulse 83   Temp 97.7 F (36.5  C) (Temporal)   Ht 5\' 1"  (1.549 m)   Wt 181 lb 3.2 oz (82.2 kg)   LMP 06/10/2010   SpO2 98%   BMI 34.24 kg/m    Physical Exam VITALS: BP- 122/64 GENERAL: Alert, cooperative, well developed, no acute distress HEENT: Normocephalic, normal oropharynx, moist mucous membranes CHEST: Clear to auscultation bilaterally, no wheezes, rhonchi, or crackles CARDIOVASCULAR: Normal heart rate and rhythm, S1 and S2 normal without murmurs ABDOMEN: Soft, non-tender, non-distended, without organomegaly, normal bowel sounds EXTREMITIES: No cyanosis or edema NEUROLOGICAL: Cranial nerves grossly intact, moves all extremities without gross motor or sensory deficit   Physical Exam Constitutional:      General: She is not in acute distress.    Appearance: Normal appearance. She is not ill-appearing or toxic-appearing.  HENT:     Head: Normocephalic and atraumatic.     Nose: Nose normal. No congestion.  Eyes:     General: No scleral icterus.    Extraocular Movements: Extraocular movements intact.  Cardiovascular:     Rate and Rhythm: Normal rate and regular rhythm.     Pulses: Normal pulses.     Heart sounds: Normal heart sounds.  Pulmonary:     Effort: Pulmonary effort is normal. No respiratory distress.     Breath sounds: Normal breath sounds.  Abdominal:     General: Abdomen is flat. Bowel sounds are normal.     Palpations: Abdomen is soft.  Musculoskeletal:        General: Normal range of motion.  Lymphadenopathy:     Cervical: No cervical adenopathy.  Skin:    General: Skin is warm and dry.     Findings: No rash.  Neurological:      General: No focal deficit present.     Mental Status: She is alert and oriented to person, place, and time. Mental status is at baseline.     Motor: No tremor.     Gait: Gait abnormal (Ambulates with walking stick).  Psychiatric:        Mood and Affect: Mood is anxious.     CT LUMBAR SPINE WO CONTRAST Result Date: 03/17/2024 CLINICAL DATA:  Sudden onset lower back pain with difficulty walking for 5 days. EXAM: CT LUMBAR SPINE WITHOUT CONTRAST TECHNIQUE: Multidetector CT imaging of the lumbar spine was performed without intravenous contrast administration. Multiplanar CT image reconstructions were also generated. RADIATION DOSE REDUCTION: This exam was performed according to the departmental dose-optimization program which includes automated exposure control, adjustment of the mA and/or kV according to patient size and/or use of iterative reconstruction technique. COMPARISON:  Lumbar spine radiograph 03/16/2024. FINDINGS: Segmentation: 5 lumbar type vertebrae. Alignment: Lumbar lordosis is maintained. No listhesis. Subtle levocurvature in the lower lumbar spine. Vertebrae: No compression fracture or displaced fracture in the lumbar spine. No suspicious osseous lesion. Paraspinal and other soft tissues: The paraspinal musculature is unremarkable. Mild atherosclerosis of the abdominal aorta and branch vessels. Cholecystectomy clips. Disc levels: Mild intervertebral disc space narrowing and vacuum disc phenomenon at L4-5 and L5-S1. Small disc bulge at L2-3 without significant spinal canal stenosis. Disc bulge and mild facet arthrosis at L3-4 resulting in mild spinal canal stenosis. Disc bulge and facet arthrosis along with mild thickening of the ligamentum flavum at L4-5 resulting in mild-to-moderate spinal canal stenosis. Disc bulge and posterior osteophytes at L5-S1 along with facet arthrosis resulting in mild-to-moderate spinal canal stenosis. There is possible narrowing of the lateral recesses. Foraminal  narrowing at multiple levels greatest on the right at L4-5. IMPRESSION:  No acute fracture or traumatic malalignment of the lumbar spine. Degenerative changes as above. Mild spinal canal stenosis at L3-4. Disc bulges and facet arthrosis resulting in mild-to-moderate spinal canal stenosis at L4-5 and L5-S1. There is likely narrowing of the lateral recesses at L5-S1. Electronically Signed   By: Emily Filbert M.D.   On: 03/17/2024 14:24   XR Lumbar Spine 2-3 Views Result Date: 03/17/2024 AP lateral radiographs lumbar spine reviewed.  Moderate to severe degenerative disc disease is present at L5-S1.  Moderate degenerative changes present throughout the lumbar spine between the vertebral bodies.  Facet arthritis also present but no definite compression fractures or spondylolisthesis.  Visualized hips intact with minimal degenerative changes and no joint space narrowing  MM 3D SCREENING MAMMOGRAM UNILATERAL RIGHT BREAST Result Date: 03/14/2024 CLINICAL DATA:  Screening. EXAM: DIGITAL SCREENING UNILATERAL RIGHT MAMMOGRAM WITH CAD AND TOMOSYNTHESIS TECHNIQUE: Right screening digital craniocaudal and mediolateral oblique mammograms were obtained. Right screening digital breast tomosynthesis was performed. The images were evaluated with computer-aided detection. COMPARISON:  Previous exam(s). ACR Breast Density Category b: There are scattered areas of fibroglandular density. FINDINGS: The patient has had a left mastectomy. There are no findings suspicious for malignancy. IMPRESSION: No mammographic evidence of malignancy. A result letter of this screening mammogram will be mailed directly to the patient. RECOMMENDATION: Screening mammogram in one year.  (Code:SM-R-3M) BI-RADS CATEGORY  1: Negative. Electronically Signed   By: Emmaline Kluver M.D.   On: 03/14/2024 08:36    Recent Results (from the past 2160 hours)  Lipase, blood     Status: None   Collection Time: 02/26/24  9:19 AM  Result Value Ref Range    Lipase 29 11 - 51 U/L    Comment: Performed at [facility], [address] [place], [place] [address]  Comprehensive metabolic panel     Status: Abnormal   Collection Time: 02/26/24  9:19 AM  Result Value Ref Range   Sodium 139 135 - 145 mmol/L   Potassium 4.0 3.5 - 5.1 mmol/L   Chloride 105 98 - 111 mmol/L   CO2 23 22 - 32 mmol/L   Glucose, Bld 112 (H) 70 - 99 mg/dL    Comment: Glucose reference range applies only to samples taken after fasting for at least 8 hours.   BUN 16 8 - 23 mg/dL   Creatinine, Ser 6.04 0.44 - 1.00 mg/dL   Calcium 9.6 8.9 - 54.0 mg/dL   Total Protein 6.6 6.5 - 8.1 g/dL   Albumin 4.2 3.5 - 5.0 g/dL   AST 18 15 - 41 U/L   ALT 6 0 - 44 U/L   Alkaline Phosphatase 42 38 - 126 U/L   Total Bilirubin 0.6 0.0 - 1.2 mg/dL   GFR, Estimated >98 >11 mL/min    Comment: (NOTE) Calculated using the CKD-EPI Creatinine Equation (2021)    Anion gap 11 5 - 15    Comment: Performed at [facility], [address] [place], [place] [address]  CBC     Status: None   Collection Time: 02/26/24  9:19 AM  Result Value Ref Range   WBC 7.2 4.0 - 10.5 K/uL   RBC 4.16 3.87 - 5.11 MIL/uL   Hemoglobin 12.5 12.0 - 15.0 g/dL   HCT 29.5 62.1 - 30.8 %   MCV 92.5 80.0 - 100.0 fL   MCH 30.0 26.0 - 34.0 pg   MCHC 32.5 30.0 - 36.0 g/dL   RDW 65.7 84.6 - 96.2 %  Platelets 213 150 - 400 K/uL   nRBC 0.0 0.0 - 0.2 %    Comment: Performed at [facility], [address], [place], [place] [address]        Garner Nash, MD, MS

## 2024-04-04 ENCOUNTER — Other Ambulatory Visit: Payer: Self-pay

## 2024-04-04 ENCOUNTER — Ambulatory Visit: Admitting: Physical Medicine and Rehabilitation

## 2024-04-04 VITALS — BP 123/79 | HR 69

## 2024-04-04 DIAGNOSIS — M5416 Radiculopathy, lumbar region: Secondary | ICD-10-CM

## 2024-04-04 HISTORY — PX: SPINE SURGERY: SHX786

## 2024-04-04 MED ORDER — METHYLPREDNISOLONE ACETATE 40 MG/ML IJ SUSP
40.0000 mg | Freq: Once | INTRAMUSCULAR | Status: AC
  Administered 2024-04-04: 40 mg

## 2024-04-04 NOTE — Progress Notes (Signed)
 Pain Scale   Average Pain 10  Here today for injection. She has pain in both legs. Left is worse.    +Driver, -BT, -Dye Allergies.

## 2024-04-04 NOTE — Patient Instructions (Signed)

## 2024-04-04 NOTE — Progress Notes (Signed)
 Jenna Miller - 68 y.o. female MRN [id number]  Date of birth: 05-31-1956  Office Visit Note: Visit Date: 04/04/2024 PCP: Garnette Gunner, MD Referred by: Garnette Gunner, MD  Subjective: Chief Complaint  Patient presents with   Lower Back - Pain   Left Leg - Pain   Right Leg - Pain   HPI:  Jenna Miller is a 68 y.o. female who comes in today at the request of G. Dorene Grebe, MD for planned Left L5-S1 Lumbar Interlaminar epidural steroid injection with fluoroscopic guidance.  The patient has failed conservative care including home exercise, medications, time and activity modification.  This injection will be diagnostic and hopefully therapeutic.  Please see requesting physician notes for further details and justification. Was right side when she saw Dr. August Saucer.   ROS Otherwise per HPI.  Assessment & Plan: Visit Diagnoses:    ICD-10-CM   1. Lumbar radiculopathy  M54.16 XR C-ARM NO REPORT    Epidural Steroid injection    methylPREDNISolone acetate (DEPO-MEDROL) injection 40 mg      Plan: No additional findings.   Meds & Orders:  Meds ordered this encounter  Medications   methylPREDNISolone acetate (DEPO-MEDROL) injection 40 mg    Orders Placed This Encounter  Procedures   XR C-ARM NO REPORT   Epidural Steroid injection    Follow-up: Return for visit to requesting provider as needed.   Procedures: No procedures performed  Lumbar Epidural Steroid Injection - Interlaminar Approach with Fluoroscopic Guidance  Patient: Jenna Miller      Date of Birth: Aug 15, 1956 MRN: [id number] PCP: Garnette Gunner, MD      Visit Date: 04/04/2024   Universal Protocol:     Consent Given By: the patient  Position: PRONE  Additional Comments: Vital signs were monitored before and after the procedure. Patient was prepped and draped in the usual sterile fashion. The correct patient, procedure, and site was verified.   Injection Procedure Details:   Procedure  diagnoses: Lumbar radiculopathy [M54.16]   Meds Administered:  Meds ordered this encounter  Medications   methylPREDNISolone acetate (DEPO-MEDROL) injection 40 mg     Laterality: Left  Location/Site:  L5-S1  Needle: 3.5 in., 20 ga. Tuohy  Needle Placement: Paramedian epidural  Findings:   -Comments: Excellent flow of contrast into the epidural space.  Procedure Details: Using a paramedian approach from the side mentioned above, the region overlying the inferior lamina was localized under fluoroscopic visualization and the soft tissues overlying this structure were infiltrated with 4 ml. of 1% Lidocaine without Epinephrine. The Tuohy needle was inserted into the epidural space using a paramedian approach.   The epidural space was localized using loss of resistance along with counter oblique bi-planar fluoroscopic views.  After negative aspirate for air, blood, and CSF, a 2 ml. volume of Isovue-250 was injected into the epidural space and the flow of contrast was observed. Radiographs were obtained for documentation purposes.    The injectate was administered into the level noted above.   Additional Comments:  The patient tolerated the procedure well Dressing: 2 x 2 sterile gauze and Band-Aid    Post-procedure details: Patient was observed during the procedure. Post-procedure instructions were reviewed.  Patient left the clinic in stable condition.   Clinical History: No specialty comments available.     Objective:  VS:  HT:    WT:   BMI:     BP:123/79  HR:69bpm  TEMP: ( )  RESP:  Physical Exam Vitals  and nursing note reviewed.  Constitutional:      General: She is not in acute distress.    Appearance: Normal appearance. She is not ill-appearing.  HENT:     Head: Normocephalic and atraumatic.     Right Ear: External ear normal.     Left Ear: External ear normal.  Eyes:     Extraocular Movements: Extraocular movements intact.  Cardiovascular:     Rate and  Rhythm: Normal rate.     Pulses: Normal pulses.  Pulmonary:     Effort: Pulmonary effort is normal. No respiratory distress.  Abdominal:     General: There is no distension.     Palpations: Abdomen is soft.  Musculoskeletal:        General: Tenderness present.     Cervical back: Neck supple.     Right lower leg: No edema.     Left lower leg: No edema.     Comments: Patient has good distal strength with no pain over the greater trochanters.  No clonus or focal weakness.  Skin:    Findings: No erythema, lesion or rash.  Neurological:     General: No focal deficit present.     Mental Status: She is alert and oriented to person, place, and time.     Cranial Nerves: No cranial nerve deficit.     Sensory: No sensory deficit.     Motor: No weakness or abnormal muscle tone.     Coordination: Coordination normal.     Gait: Gait abnormal.  Psychiatric:        Mood and Affect: Mood normal.        Behavior: Behavior normal.      Imaging: No results found.

## 2024-04-04 NOTE — Procedures (Signed)
 Lumbar Epidural Steroid Injection - Interlaminar Approach with Fluoroscopic Guidance  Patient: Jenna Miller      Date of Birth: 09/10/1956 MRN: [id number] PCP: Garnette Gunner, MD      Visit Date: 04/04/2024   Universal Protocol:     Consent Given By: the patient  Position: PRONE  Additional Comments: Vital signs were monitored before and after the procedure. Patient was prepped and draped in the usual sterile fashion. The correct patient, procedure, and site was verified.   Injection Procedure Details:   Procedure diagnoses: Lumbar radiculopathy [M54.16]   Meds Administered:  Meds ordered this encounter  Medications   methylPREDNISolone acetate (DEPO-MEDROL) injection 40 mg     Laterality: Left  Location/Site:  L5-S1  Needle: 3.5 in., 20 ga. Tuohy  Needle Placement: Paramedian epidural  Findings:   -Comments: Excellent flow of contrast into the epidural space.  Procedure Details: Using a paramedian approach from the side mentioned above, the region overlying the inferior lamina was localized under fluoroscopic visualization and the soft tissues overlying this structure were infiltrated with 4 ml. of 1% Lidocaine without Epinephrine. The Tuohy needle was inserted into the epidural space using a paramedian approach.   The epidural space was localized using loss of resistance along with counter oblique bi-planar fluoroscopic views.  After negative aspirate for air, blood, and CSF, a 2 ml. volume of Isovue-250 was injected into the epidural space and the flow of contrast was observed. Radiographs were obtained for documentation purposes.    The injectate was administered into the level noted above.   Additional Comments:  The patient tolerated the procedure well Dressing: 2 x 2 sterile gauze and Band-Aid    Post-procedure details: Patient was observed during the procedure. Post-procedure instructions were reviewed.  Patient left the clinic in stable  condition.

## 2024-05-09 DIAGNOSIS — H25813 Combined forms of age-related cataract, bilateral: Secondary | ICD-10-CM | POA: Diagnosis not present

## 2024-05-09 DIAGNOSIS — H33103 Unspecified retinoschisis, bilateral: Secondary | ICD-10-CM | POA: Diagnosis not present

## 2024-05-09 DIAGNOSIS — H04123 Dry eye syndrome of bilateral lacrimal glands: Secondary | ICD-10-CM | POA: Diagnosis not present

## 2024-05-09 DIAGNOSIS — H524 Presbyopia: Secondary | ICD-10-CM | POA: Diagnosis not present

## 2024-05-09 DIAGNOSIS — R7309 Other abnormal glucose: Secondary | ICD-10-CM | POA: Diagnosis not present

## 2024-05-12 ENCOUNTER — Encounter: Payer: Self-pay | Admitting: Neurology

## 2024-05-30 ENCOUNTER — Ambulatory Visit: Payer: BC Managed Care – PPO | Admitting: Nurse Practitioner

## 2024-06-02 NOTE — Progress Notes (Signed)
 Assessment/Plan:   1.  Parkinsons Disease, diagnosed March, 2022  -having nausea but having it only at 10:15-10:30 am and taking levodopa  at 7am/11am/4pm.  This is strange if all related to levodopa .  I am going to have her trial 2 different things:   Hold carbidopa /levodopa  for 2 days and see if she has the nausea (obviously the motor sx's will be worse)   Then take carbidopa /levodopa  at 7am/10am/1pm/4pm until Monday and call me then (I wonder if she is experiencing "off" at 10:30 am and getting some internal tremor with nausea feeling)   -call me Monday and let me know how she does.  If she doesn't do well with either of the above options, we will use rytary  195 mg tid instead of IR levodopa .  -discussed exercise and exercises that are appropriate for her.  She wants to hold on PT and discuss it next time.  -we discussed DA and may need to consider adding this in the future  -mild Parkinsons Disease dyskinesia - not bothersome to patient and no medication necessary   2.  Constipation  -linzess  caused urgency/incontinence  3.  LBP  -she is doing better but not fully recovered and wants to hold on PT  -just had epidural injection and it helped.    4.  Insomnia  -start melatonin 3-9 mg OTC  -told her to stop taking nap after work (falling asleep watching TV)  Subjective:   Jenna Miller was seen today in follow up for Parkinsons disease.  My previous records were reviewed prior to todays visit as well as outside records available to me.  Pt worked in to discuss nausea with carbidopa /levodopa .  However, she is only nauseated at 10:15-10:30am and she takes medication at 7am/11am/4pm.   She reports it usually leaves around lunchtime "when I eat but sometimes it lasts until 4 pm when I take my next pill."   Pt denies falls.  Pt denies lightheadedness, near syncope.  No hallucinations.  Mood has been good.  Has insomnia.  She had epidural for her back and that helped.    Current  prescribed movement disorder medications: Carbidopa /levodopa  25/100, 1 tablet 3 times per day    PREVIOUS MEDICATIONS: Sinemet  (nausea)  ALLERGIES:   Allergies  Allergen Reactions   Lipitor [Atorvastatin] Anaphylaxis and Swelling    Other reaction(s): Other (See Comments) Unknown   Effexor [Venlafaxine]     Other reaction(s): Other (See Comments)   Paxil [Paroxetine Hcl]     Other reaction(s): Other (See Comments)   Wellbutrin [Bupropion] Hives    CURRENT MEDICATIONS:  Outpatient Encounter Medications as of 06/07/2024  Medication Sig   BIOTIN 5000 PO Take by mouth. Skin, hair and nails   carbidopa -levodopa  (SINEMET  IR) 25-100 MG tablet Take 1 tablet by mouth 3 (three) times daily. 7am/11am/4pm   Cholecalciferol (VITAMIN D3 PO) Take 5,000 mg by mouth daily.   ezetimibe  (ZETIA ) 10 MG tablet TAKE 1 TABLET BY MOUTH EVERY DAY FOR CHOLESTEROL   famotidine  (PEPCID ) 40 MG tablet TAKE 1 TABLET 2 X /DAY TO PREVENT HEARTBURN & INDIGESTION   Methylcobalamin (B12-ACTIVE) 1 MG CHEW Chew 1 each by mouth daily.   polyethylene glycol (MIRALAX ) 17 g packet Take 17 g by mouth daily as needed for moderate constipation.   [DISCONTINUED] diazepam  (VALIUM ) 5 MG tablet Take one tablet by mouth with food one hour prior to procedure. May repeat 30 minutes prior if needed.   [DISCONTINUED] fluticasone  (FLONASE ) 50 MCG/ACT nasal spray SPRAY 2  SPRAYS INTO EACH NOSTRIL EVERY DAY (Patient not taking: Reported on 03/29/2024)   [DISCONTINUED] hydrocortisone  (ANUSOL -HC) 2.5 % rectal cream Place 1 Application rectally 2 (two) times daily. (Patient not taking: Reported on 03/29/2024)   [DISCONTINUED] linaclotide  (LINZESS ) 72 MCG capsule Take 1 capsule (72 mcg total) by mouth daily before breakfast. (Patient not taking: Reported on 03/29/2024)   [DISCONTINUED] traMADol  (ULTRAM ) 50 MG tablet Take 1 tablet (50 mg total) by mouth every 6 (six) hours as needed. (Patient not taking: Reported on 03/29/2024)   No  facility-administered encounter medications on file as of 06/07/2024.    Objective:   PHYSICAL EXAMINATION:    VITALS:   Vitals:   06/07/24 0739  BP: 124/78  Pulse: 84  Weight: 181 lb 6.4 oz (82.3 kg)     GEN:  The patient appears stated age and is in NAD. HEENT:  Normocephalic, atraumatic.  The mucous membranes are moist. The superficial temporal arteries are without ropiness or tenderness. CV:  RRR Lungs:  CTAB Neck/HEME:  There are no carotid bruits bilaterally.  Neurological examination:  Orientation: The patient is alert and oriented x3. Cranial nerves: There is good facial symmetry with facial hypomimia. The speech is fluent and clear. Soft palate rises symmetrically and there is no tongue deviation. Hearing is intact to conversational tone. Sensation: Sensation is intact to light touch throughout Motor: Strength is at least antigravity x4.  Movement examination: Tone: There is nl tone in the UE/LE Abnormal movements:there is rare dyskinesia in the L foot Coordination:  There is mild decremation on the L with finger taps, toe taps and hand opening and closing Gait and Station: The patient has difficulty arising out of a deep-seated chair without the use of the hands. The patient's stride length is antalgic (has to do with her back and recent epidural)   I have reviewed and interpreted the following labs independently    Chemistry      Component Value Date/Time   NA 139 02/26/2024 0919   K 4.0 02/26/2024 0919   CL 105 02/26/2024 0919   CO2 23 02/26/2024 0919   BUN 16 02/26/2024 0919   CREATININE 0.92 02/26/2024 0919   CREATININE 0.96 10/30/2023 0947      Component Value Date/Time   CALCIUM  9.6 02/26/2024 0919   ALKPHOS 42 02/26/2024 0919   AST 18 02/26/2024 0919   ALT 6 02/26/2024 0919   BILITOT 0.6 02/26/2024 0919       Lab Results  Component Value Date   WBC 7.2 02/26/2024   HGB 12.5 02/26/2024   HCT 38.5 02/26/2024   MCV 92.5 02/26/2024   PLT 213  02/26/2024    Lab Results  Component Value Date   TSH 0.92 10/30/2023    Total time spent on today's visit was 32 minutes, including both face-to-face time and nonface-to-face time.  Time included that spent on review of records (prior notes available to me/labs/imaging if pertinent), discussing treatment and goals, answering patient's questions and coordinating care.    Cc:  Catheryn Cluck, MD

## 2024-06-07 ENCOUNTER — Encounter: Payer: Self-pay | Admitting: Neurology

## 2024-06-07 ENCOUNTER — Ambulatory Visit: Admitting: Neurology

## 2024-06-07 VITALS — BP 124/78 | HR 84 | Wt 181.4 lb

## 2024-06-07 DIAGNOSIS — R110 Nausea: Secondary | ICD-10-CM | POA: Diagnosis not present

## 2024-06-07 DIAGNOSIS — G20B1 Parkinson's disease with dyskinesia, without mention of fluctuations: Secondary | ICD-10-CM

## 2024-06-07 DIAGNOSIS — G4701 Insomnia due to medical condition: Secondary | ICD-10-CM

## 2024-06-07 NOTE — Patient Instructions (Addendum)
 Hold carbidopa /levodopa  for 2 days and see if she has the nausea (obviously the motor sx's will be worse) After 2 days (on Friday),  take carbidopa /levodopa  at 7am/10am/1pm/4pm until Monday and call me then (I wonder if she is experiencing "off" at 10:30 am and getting some internal tremor with nausea feeling)   Start melatonin, 3 mg (you can go up to 8 mg) 30 min to 1 hour prior to bed.  Email me on Monday and let me know how you are doing    SAVE THE DATE!  We are planning a Parkinsons Disease educational symposium at [facility] in [place] on September 19.  More details to come!  We will have a movement disorder physician expert from [employer] coming to speak and a caregiver speaker.  We will have a panel of experts that will show you who you may need on your "team" of people on your journey with Parkinsons.  If you would like to be added to our email list to get further information, email [email or web address].  I hope to see you there!

## 2024-06-08 NOTE — Telephone Encounter (Signed)
 Called patient no answer left voicemail

## 2024-06-09 ENCOUNTER — Telehealth: Payer: Self-pay | Admitting: Neurology

## 2024-06-09 NOTE — Telephone Encounter (Signed)
 Pt wants to talk with chelsea asap. She would prefer a call asap and not this afternoon. She wants to start her medication tat put her on Asap

## 2024-06-09 NOTE — Telephone Encounter (Signed)
 Called patient back she is not having any nausea but she is in a horrible state of shaking and stiffness. She had to leave work early today because of the discomfort. Patient is also not sleeping even with the Melatonin. She has refused any sleep study but wanted to know if she could take Melatonin. Calling patient on Monday to see if she is having symptoms of nausea

## 2024-06-11 DIAGNOSIS — H5203 Hypermetropia, bilateral: Secondary | ICD-10-CM | POA: Diagnosis not present

## 2024-06-11 DIAGNOSIS — H524 Presbyopia: Secondary | ICD-10-CM | POA: Diagnosis not present

## 2024-06-11 DIAGNOSIS — H52209 Unspecified astigmatism, unspecified eye: Secondary | ICD-10-CM | POA: Diagnosis not present

## 2024-06-14 ENCOUNTER — Telehealth: Payer: Self-pay | Admitting: Neurology

## 2024-06-14 NOTE — Telephone Encounter (Signed)
Patient is calling chelsea back.

## 2024-06-15 NOTE — Telephone Encounter (Signed)
Pt. Called again.

## 2024-06-15 NOTE — Telephone Encounter (Signed)
 Patient calling back to let us  know she is having some spells of nausea but mostly better.She is taking levodopa  at 7/10/1/and 4 she is taking the melatonin is helping with sleep. She is feeling off periods after 1. She wants to know if August appointment needs to be cancelled. She has added more fresh fruit and veggies and drinking a lot more water. She said she felt so bad off meds she never wants to go through being off medication again

## 2024-06-16 ENCOUNTER — Other Ambulatory Visit: Payer: Self-pay

## 2024-06-16 MED ORDER — CARBIDOPA-LEVODOPA 25-100 MG PO TABS: 1.0000 | ORAL_TABLET | Freq: Four times a day (QID) | ORAL | 0 refills | Status: DC

## 2024-06-16 NOTE — Telephone Encounter (Signed)
 I already sent you this message from Patient calls and I called her and responded to the message she has now sent a new message

## 2024-06-16 NOTE — Telephone Encounter (Signed)
 Called pateint and fixed her meds for carbidopa  levodopa  timing.  She will call next week to let us  know if she is still doing great she will release the August appointment

## 2024-06-28 ENCOUNTER — Encounter: Payer: Self-pay | Admitting: Family Medicine

## 2024-06-28 ENCOUNTER — Ambulatory Visit (INDEPENDENT_AMBULATORY_CARE_PROVIDER_SITE_OTHER): Admitting: Family Medicine

## 2024-06-28 VITALS — BP 120/64 | HR 72 | Temp 98.0°F | Ht 61.0 in | Wt 182.8 lb

## 2024-06-28 DIAGNOSIS — R7309 Other abnormal glucose: Secondary | ICD-10-CM | POA: Diagnosis not present

## 2024-06-28 DIAGNOSIS — E559 Vitamin D deficiency, unspecified: Secondary | ICD-10-CM

## 2024-06-28 DIAGNOSIS — I10 Essential (primary) hypertension: Secondary | ICD-10-CM | POA: Diagnosis not present

## 2024-06-28 DIAGNOSIS — E782 Mixed hyperlipidemia: Secondary | ICD-10-CM

## 2024-06-28 DIAGNOSIS — Z0000 Encounter for general adult medical examination without abnormal findings: Secondary | ICD-10-CM

## 2024-06-28 DIAGNOSIS — Z79899 Other long term (current) drug therapy: Secondary | ICD-10-CM | POA: Diagnosis not present

## 2024-06-28 DIAGNOSIS — G20B2 Parkinson's disease with dyskinesia, with fluctuations: Secondary | ICD-10-CM

## 2024-06-28 DIAGNOSIS — N182 Chronic kidney disease, stage 2 (mild): Secondary | ICD-10-CM

## 2024-06-28 DIAGNOSIS — R7303 Prediabetes: Secondary | ICD-10-CM

## 2024-06-28 DIAGNOSIS — Z1389 Encounter for screening for other disorder: Secondary | ICD-10-CM

## 2024-06-28 DIAGNOSIS — Z23 Encounter for immunization: Secondary | ICD-10-CM

## 2024-06-28 DIAGNOSIS — M4807 Spinal stenosis, lumbosacral region: Secondary | ICD-10-CM

## 2024-06-28 DIAGNOSIS — Z1211 Encounter for screening for malignant neoplasm of colon: Secondary | ICD-10-CM

## 2024-06-28 LAB — CBC WITH DIFFERENTIAL/PLATELET
Basophils Absolute: 0 10*3/uL (ref 0.0–0.1)
Basophils Relative: 0.6 % (ref 0.0–3.0)
Eosinophils Absolute: 0.1 10*3/uL (ref 0.0–0.7)
Eosinophils Relative: 0.8 % (ref 0.0–5.0)
HCT: 37.7 % (ref 36.0–46.0)
Hemoglobin: 12.5 g/dL (ref 12.0–15.0)
Lymphocytes Relative: 16 % (ref 12.0–46.0)
Lymphs Abs: 1.1 10*3/uL (ref 0.7–4.0)
MCHC: 33.2 g/dL (ref 30.0–36.0)
MCV: 90 fl (ref 78.0–100.0)
Monocytes Absolute: 0.5 10*3/uL (ref 0.1–1.0)
Monocytes Relative: 7.5 % (ref 3.0–12.0)
Neutro Abs: 5.2 10*3/uL (ref 1.4–7.7)
Neutrophils Relative %: 75.1 % (ref 43.0–77.0)
Platelets: 212 10*3/uL (ref 150.0–400.0)
RBC: 4.18 Mil/uL (ref 3.87–5.11)
RDW: 14.5 % (ref 11.5–15.5)
WBC: 7 10*3/uL (ref 4.0–10.5)

## 2024-06-28 LAB — URINALYSIS, ROUTINE W REFLEX MICROSCOPIC
Bilirubin Urine: NEGATIVE
Hgb urine dipstick: NEGATIVE
Ketones, ur: NEGATIVE
Nitrite: NEGATIVE
Specific Gravity, Urine: 1.01 (ref 1.000–1.030)
Total Protein, Urine: NEGATIVE
Urine Glucose: NEGATIVE
Urobilinogen, UA: 0.2 (ref 0.0–1.0)
pH: 6 (ref 5.0–8.0)

## 2024-06-28 LAB — LIPID PANEL
Cholesterol: 206 mg/dL — ABNORMAL HIGH (ref 0–200)
HDL: 63.5 mg/dL (ref >39.00)
LDL Cholesterol: 123 mg/dL — ABNORMAL HIGH (ref 0–99)
NonHDL: 142.5
Total CHOL/HDL Ratio: 3
Triglycerides: 96 mg/dL (ref 0.0–149.0)
VLDL: 19.2 mg/dL (ref 0.0–40.0)

## 2024-06-28 LAB — MAGNESIUM: Magnesium: 1.9 mg/dL (ref 1.5–2.5)

## 2024-06-28 LAB — MICROALBUMIN / CREATININE URINE RATIO
Creatinine,U: 69.5 mg/dL
Microalb Creat Ratio: UNDETERMINED mg/g (ref 0.0–30.0)
Microalb, Ur: <0.7 mg/dL

## 2024-06-28 LAB — TSH: TSH: 1.22 u[IU]/mL (ref 0.35–5.50)

## 2024-06-28 LAB — VITAMIN D 25 HYDROXY (VIT D DEFICIENCY, FRACTURES): VITD: 67.7 ng/mL (ref 30.00–100.00)

## 2024-06-28 NOTE — Progress Notes (Signed)
 Assessment  Assessment/Plan:   Assessment & Plan Spinal Stenosis Recently diagnosed with spinal stenosis, causing lower back pain. She received a steroid injection epidural on April 7, which has provided significant relief. She reports occasional back pain but is able to walk and perform daily activities with caution. She uses a cane for longer distances to ensure stability. - Encourage short walks and light activities - Consider further interventions if symptoms worsen  Parkinson's Disease Parkinson's disease managed with carbidopa -levodopa . She experiences fluctuations in symptoms, with mornings being better than afternoons. She remains active and is considering part-time work to stay engaged. Discussed the potential benefit of specialty rehab at [facility] for balance issues if she worsens, as early intervention can help maintain function. - Continue carbidopa -levodopa  - Encourage balance exercises and short walks - Consider referral to [facility] specialty rehab for Parkinson's if balance issues worsen  Chronic Kidney Disease Stage 2 Stage 2 chronic kidney disease. Monitoring kidney function and related parameters is essential. Unable to provide urine sample today; plan to obtain at a future visit. - Order complete metabolic panel, hemoglobin A1c, microalbumin creatinine, and urinalysis with reflex and culture  Hyperglycemia Hyperglycemia with abnormal glucose levels. She prefers not to take medication for prediabetes, focusing on lifestyle modifications instead. - Order hemoglobin A1c test  Hypertension Hypertension, currently well-controlled with lifestyle modifications. Monitoring thyroid  function due to its potential impact on blood pressure. - Order TSH test  Hyperlipidemia Hyperlipidemia. Monitoring is necessary to assess current lipid levels. - Order fasting lipid panel  Gastroesophageal Reflux Disease (GERD) GERD, managed with famotidine  40 mg daily. - Continue famotidine  40 mg  daily  Vitamin D  Deficiency Vitamin D  deficiency. Monitoring vitamin D  levels to ensure adequate supplementation. - Order vitamin D  level test  General Health Maintenance She is due for a DEXA scan and colon cancer screening. Last colonoscopy was in 2012, and she is past due for a follow-up due to COVID-19 delays. Prefers not to travel far for the procedure. - Request DEXA scan from [facility] - Refer to [facility] for colonoscopy     There are no discontinued medications.  Patient Counseling(The following topics were reviewed and/or handout was given):  -Nutrition: Stressed importance of moderation in sodium/caffeine intake, saturated fat and cholesterol, caloric balance, sufficient intake of fresh fruits, vegetables, and fiber.  -Stressed the importance of regular exercise.   -Substance Abuse: Discussed cessation/primary prevention of tobacco, alcohol, or other drug use; driving or other dangerous activities under the influence; availability of treatment for abuse.   -Injury prevention: Discussed safety belts, safety helmets, smoke detector, smoking near bedding or upholstery.   -Sexuality: Discussed sexually transmitted diseases, partner selection, use of condoms, avoidance of unintended pregnancy and contraceptive alternatives.   -Dental health: Discussed importance of regular tooth brushing, flossing, and dental visits.  -Health maintenance and immunizations reviewed. Please refer to Health maintenance section.  Return in about 6 months (around 12/29/2024) for HLD, fasting labs.        Subjective:   Encounter date: 06/28/2024  Chief Complaint  Patient presents with   Annual Exam    Physical; found out have spinal stenosis; had a cortisone epidural on 04/04/24; pt is fasting for labs    Discussed the use of AI scribe software for clinical note transcription with the patient, who gave verbal consent to proceed.  History of Present Illness Jenna Miller is a  68 year old female who presents for her annual physical exam.  She has a history of hyperlipidemia  and is currently monitoring her condition with a fasting lipid panel. She also takes magnesium and TSH due to her history of hypertension.  She has stage two chronic kidney disease and will undergo a complete metabolic panel, hemoglobin A1c, microalbumin creatinine, and urinalysis with reflex and culture. However, she was unable to provide a urine sample during this visit.  She has a history of vitamin D  deficiency and will have her vitamin D  levels checked. She also has a history of hyperglycemia and abnormal glucose levels, for which hemoglobin A1c will be checked.  She has Parkinson's disease and is on carbidopa -levodopa , following up with neurology under Dr. Tiburcio. Her Parkinson's symptoms vary, with mornings being better than afternoons. She continues to work full-time but is considering part-time work due to her health and age.  She has GERD and takes famotidine  40 mg daily. She also has drug-induced hypertension from Sinemet , for which she takes Miralax . Her blood pressure is well-controlled with lifestyle modifications.  She was recently diagnosed with spinal stenosis and received an epidural steroid injection on April 7, which has helped her maintain mobility. She uses a cane for longer walks and reports that her balance is still good.  She reports a recent incident where her finger was shut in a car door, resulting in a nail injury. She is managing the injury by keeping the nail trimmed and covered.  No chest pain, shortness of breath, or leg swelling. Reports heat sensitivity, which affects her well-being.       03/29/2024    3:09 PM 08/08/2020    8:27 AM 08/07/2020    3:31 PM 09/15/2016    9:03 AM 02/26/2016    8:39 PM  Depression screen PHQ 2/9  Decreased Interest 0 0 1 0 0  Down, Depressed, Hopeless 0 0 0 0 0  PHQ - 2 Score 0 0 1 0 0  Altered sleeping 0 0     Tired, decreased  energy 0 1     Change in appetite 0 0     Feeling bad or failure about yourself  0 0     Trouble concentrating 0 0     Moving slowly or fidgety/restless 0 0     Suicidal thoughts 0 0     PHQ-9 Score 0 1     Difficult doing work/chores Not difficult at all Not difficult at all          03/29/2024    3:09 PM 08/08/2020    8:27 AM  GAD 7 : Generalized Anxiety Score  Nervous, Anxious, on Edge 0 0  Control/stop worrying 0 0  Worry too much - different things 0 0  Trouble relaxing 0 0  Restless 0 0  Easily annoyed or irritable 0 0  Afraid - awful might happen 0 0  Total GAD 7 Score 0 0  Anxiety Difficulty Not difficult at all Not difficult at all    Health Maintenance Due  Topic Date Due   Medicare Annual Wellness (AWV)  Never done   DEXA SCAN  03/06/2020   Colonoscopy  10/06/2021   COVID-19 Vaccine (4 - 2024-25 season) 08/30/2023       PMH:  The following were reviewed and entered/updated in epic: Past Medical History:  Diagnosis Date   Acid reflux    Arthritis    Cancer (HCC)    Left Breast   Cataract    Complication of anesthesia    woke  up X 1   Fatty liver  High cholesterol    Hypertension    Spider bite 2016   Vitamin D  deficiency     Patient Active Problem List   Diagnosis Date Noted   Chronic midline low back pain 03/29/2024   Arm pain 10/10/2022   Parkinson's disease (HCC) 03/19/2021   History of adenomatous polyp of colon 02/14/2021   Dysfunction of right eustachian tube 08/07/2020   Constipation 02/09/2019   CKD Stage 2 (GFR 69 ml/min) 09/22/2014   Medication management 09/22/2014   Pre-diabetes 09/22/2014   Morbid Obesity (BMI 32.54) 09/22/2014   Breast cancer, Left 09/22/2014   GERD    Vitamin D  deficiency    Fatty liver    Essential hypertension    Hyperlipidemia     Past Surgical History:  Procedure Laterality Date   BREAST SURGERY  1995   mastectomy left   CHOLECYSTECTOMY  11/19/2012   Procedure: LAPAROSCOPIC CHOLECYSTECTOMY  WITH INTRAOPERATIVE CHOLANGIOGRAM;  Surgeon: Dann FORBES Hummer, MD;  Location: [facility] OR;  Service: General;  Laterality: N/A;   HERNIA REPAIR     KNEE ARTHROSCOPY  1982   MASTECTOMY Left 1995   SPINE SURGERY  04/04/2024   Cortisone epidural   TRAM  1999-2000   WISDOM TOOTH EXTRACTION      Family History  Problem Relation Age of Onset   Hypertension Mother    Hyperlipidemia Mother    Gout Father 74       trauma   Cancer Cousin        lung   Hyperlipidemia Brother    Hypertension Brother    COPD Sister     Medications- reviewed and updated Outpatient Medications Prior to Visit  Medication Sig Dispense Refill   BIOTIN 5000 PO Take by mouth. Skin, hair and nails     carbidopa -levodopa  (SINEMET  IR) 25-100 MG tablet Take 1 tablet by mouth 4 (four) times daily. Take 1 at 7/10/1/and 4 360 tablet 0   Cholecalciferol (VITAMIN D3 PO) Take 5,000 mg by mouth daily.     ezetimibe  (ZETIA ) 10 MG tablet TAKE 1 TABLET BY MOUTH EVERY DAY FOR CHOLESTEROL 90 tablet 3   famotidine  (PEPCID ) 40 MG tablet TAKE 1 TABLET 2 X /DAY TO PREVENT HEARTBURN & INDIGESTION 180 tablet 3   Methylcobalamin (B12-ACTIVE) 1 MG CHEW Chew 1 each by mouth daily.     polyethylene glycol (MIRALAX ) 17 g packet Take 17 g by mouth daily as needed for moderate constipation. 30 each 0   No facility-administered medications prior to visit.    Allergies  Allergen Reactions   Lipitor [Atorvastatin] Anaphylaxis and Swelling    Other reaction(s): Other (See Comments) Unknown   Effexor [Venlafaxine]     Other reaction(s): Other (See Comments)   Paxil [Paroxetine Hcl]     Other reaction(s): Other (See Comments)   Wellbutrin [Bupropion] Hives    Social History   Socioeconomic History   Marital status: Divorced    Spouse name: Not on file   Number of children: Not on file   Years of education: Not on file   Highest education level: Some college, no degree  Occupational History   Not on file  Tobacco Use   Smoking status:  Never   Smokeless tobacco: Never  Vaping Use   Vaping status: Never Used  Substance and Sexual Activity   Alcohol use: Not Currently    Alcohol/week: 1.0 standard drink of alcohol    Comment: rare   Drug use: No   Sexual activity: Not on file  Other Topics Concern   Not on file  Social History Narrative   Right handed    Social Drivers of Health   Financial Resource Strain: Patient Declined (06/27/2024)   Overall Financial Resource Strain (CARDIA)    Difficulty of Paying Living Expenses: Patient declined  Food Insecurity: Patient Declined (06/27/2024)   Hunger Vital Sign    Worried About Running Out of Food in the Last Year: Patient declined    Ran Out of Food in the Last Year: Patient declined  Transportation Needs: No Transportation Needs (06/27/2024)   PRAPARE - Administrator, Civil Service (Medical): No    Lack of Transportation (Non-Medical): No  Physical Activity: Unknown (06/27/2024)   Exercise Vital Sign    Days of Exercise per Week: Patient declined    Minutes of Exercise per Session: Not on file  Stress: Patient Declined (06/27/2024)   [employer] of Occupational Health - Occupational Stress Questionnaire    Feeling of Stress: Patient declined  Social Connections: Socially Isolated (06/27/2024)   Social Connection and Isolation Panel    Frequency of Communication with Friends and Family: More than three times a week    Frequency of Social Gatherings with Friends and Family: Once a week    Attends Religious Services: Patient declined    Database administrator or Organizations: No    Attends Engineer, structural: Not on file    Marital Status: Divorced           Objective:  Physical Exam: BP 120/64   Pulse 72   Temp 98 F (36.7 C)   Ht 5' 1 (1.549 m)   Wt 182 lb 12.8 oz (82.9 kg)   LMP 06/10/2010   SpO2 98%   BMI 34.54 kg/m   Body mass index is 34.54 kg/m. Wt Readings from Last 3 Encounters:  06/28/24 182 lb 12.8 oz (82.9  kg)  06/07/24 181 lb 6.4 oz (82.3 kg)  03/29/24 181 lb 3.2 oz (82.2 kg)    Physical Exam GENERAL: Alert, cooperative, well developed, no acute distress. HEENT: Normocephalic, normal oropharynx, moist mucous membranes. CHEST: Clear to auscultation bilaterally, no wheezes, rhonchi, or crackles. CARDIOVASCULAR: Normal heart rate and rhythm, S1 and S2 normal without murmurs. ABDOMEN: Soft, non-tender, non-distended, without organomegaly, normal bowel sounds. EXTREMITIES: No cyanosis, edema, or leg swelling. NEUROLOGICAL: Walks with cane, no tremor noticed today, Cranial nerves grossly intact, moves all extremities without gross motor or sensory deficit.        Prior labs:   No results found for this or any previous visit (from the past 2160 hours).  Lab Results  Component Value Date   CHOL 209 (H) 10/30/2023   CHOL 219 (H) 04/28/2023   CHOL 215 (H) 10/24/2022   Lab Results  Component Value Date   HDL 77 10/30/2023   HDL 74 04/28/2023   HDL 71 10/24/2022   Lab Results  Component Value Date   LDLCALC 114 (H) 10/30/2023   LDLCALC 128 (H) 04/28/2023   LDLCALC 124 (H) 10/24/2022   Lab Results  Component Value Date   TRIG 85 10/30/2023   TRIG 74 04/28/2023   TRIG 97 10/24/2022   Lab Results  Component Value Date   CHOLHDL 2.7 10/30/2023   CHOLHDL 3.0 04/28/2023   CHOLHDL 3.0 10/24/2022   No results found for: LDLDIRECT  Last metabolic panel Lab Results  Component Value Date   GLUCOSE 112 (H) 02/26/2024   NA 139 02/26/2024   K 4.0 02/26/2024  CL 105 02/26/2024   CO2 23 02/26/2024   BUN 16 02/26/2024   CREATININE 0.92 02/26/2024   GFRNONAA >60 02/26/2024   CALCIUM  9.6 02/26/2024   PROT 6.6 02/26/2024   ALBUMIN 4.2 02/26/2024   BILITOT 0.6 02/26/2024   ALKPHOS 42 02/26/2024   AST 18 02/26/2024   ALT 6 02/26/2024   ANIONGAP 11 02/26/2024    Lab Results  Component Value Date   HGBA1C 6.0 (H) 10/30/2023    Last CBC Lab Results  Component Value Date    WBC 7.2 02/26/2024   HGB 12.5 02/26/2024   HCT 38.5 02/26/2024   MCV 92.5 02/26/2024   MCH 30.0 02/26/2024   RDW 13.3 02/26/2024   PLT 213 02/26/2024    Lab Results  Component Value Date   TSH 0.92 10/30/2023    No results found for: PSA1, PSA  Last vitamin D  Lab Results  Component Value Date   VD25OH 75 10/30/2023    Lab Results  Component Value Date   BILIRUBINUR NEGATIVE 09/24/2016   PROTEINUR TRACE (A) 10/30/2023   UROBILINOGEN 0.2 10/04/2012   LEUKOCYTESUR TRACE (A) 10/30/2023    Lab Results  Component Value Date   MICROALBUR 0.3 10/24/2022   MICROALBUR 0.4 10/24/2021     At today's visit, we discussed treatment options, associated risk and benefits, and engage in counseling as needed.  Additionally the following were reviewed: Past medical records, past medical and surgical history, family and social background, as well as relevant laboratory results, imaging findings, and specialty notes, where applicable.  This message was generated using dictation software, and as a result, it may contain unintentional typos or errors.  Nevertheless, extensive effort was made to accurately convey at the pertinent aspects of the patient visit.    There may have been are other unrelated non-urgent complaints, but due to the busy schedule and the amount of time already spent with her, time does not permit to address these issues at today's visit. Another appointment may have or has been requested to review these additional issues.     Arvella Hummer, MD, MS

## 2024-06-28 NOTE — Patient Instructions (Signed)
  VISIT SUMMARY: Today, you had your annual physical exam. We reviewed your ongoing health conditions, including hyperlipidemia, hypertension, chronic kidney disease, vitamin D  deficiency, hyperglycemia, Parkinson's disease, GERD, and spinal stenosis. We discussed your current medications and lifestyle modifications, and we planned several follow-up tests and referrals to ensure comprehensive care.  YOUR PLAN: -SPINAL STENOSIS: Spinal stenosis is a narrowing of the spaces within your spine, which can put pressure on the nerves. You received a steroid injection on April 7, which has helped with your mobility. Continue with short walks and light activities, and use a cane for longer distances. If symptoms worsen, we may consider further interventions.  -PARKINSON'S DISEASE: Parkinson's disease is a disorder of the central nervous system that affects movement. You are managing it with carbidopa -levodopa  and experience better symptoms in the mornings. Continue your medication, balance exercises, and short walks. If your balance issues worsen, we may refer you to [facility] specialty rehab for early intervention.  -CHRONIC KIDNEY DISEASE STAGE 2: Chronic kidney disease stage 2 means your kidneys are not functioning at full capacity. We will monitor your kidney function and related parameters. Since you couldn't provide a urine sample today, we will plan to obtain it at a future visit. We have ordered a complete metabolic panel, hemoglobin A1c, microalbumin creatinine, and urinalysis with reflex and culture.  -HYPERGLYCEMIA: Hyperglycemia means high blood sugar levels. You prefer to manage this with lifestyle modifications rather than medication. We have ordered a hemoglobin A1c test to monitor your glucose levels.  -HYPERTENSION: Hypertension is high blood pressure. Your blood pressure is well-controlled with lifestyle modifications. We will monitor your thyroid  function as it can impact blood pressure. We have  ordered a TSH test.  -HYPERLIPIDEMIA: Hyperlipidemia means high levels of fats (lipids) in your blood. We will monitor your lipid levels with a fasting lipid panel.  -GASTROESOPHAGEAL REFLUX DISEASE (GERD): GERD is a digestive disorder where stomach acid irritates the food pipe lining. Continue taking famotidine  40 mg daily to manage your symptoms.  -VITAMIN D  DEFICIENCY: Vitamin D  deficiency means you have lower than normal levels of vitamin D . We will monitor your vitamin D  levels to ensure you are getting adequate supplementation.  -GENERAL HEALTH MAINTENANCE: You are due for a DEXA scan to check your bone density and a colon cancer screening. Your last colonoscopy was in 2012, and you are past due for a follow-up. We will request a DEXA scan from [facility] and refer you to [facility] for a colonoscopy.  INSTRUCTIONS: Please follow up with the lab tests we have ordered, including the complete metabolic panel, hemoglobin A1c, microalbumin creatinine, urinalysis with reflex and culture, TSH test, fasting lipid panel, a nd vitamin D  level test. Additionally, schedule your DEXA scan with [facility] and your colonoscopy with [place]. If you experience any worsening of your symptoms or have any concerns, please contact our office.

## 2024-06-29 LAB — COMPLETE METABOLIC PANEL WITHOUT GFR
AG Ratio: 2 (calc) (ref 1.0–2.5)
ALT: 9 U/L (ref 6–29)
AST: 17 U/L (ref 10–35)
Albumin: 4.4 g/dL (ref 3.6–5.1)
Alkaline phosphatase (APISO): 52 U/L (ref 37–153)
BUN: 20 mg/dL (ref 7–25)
CO2: 26 mmol/L (ref 20–32)
Calcium: 9.9 mg/dL (ref 8.6–10.4)
Chloride: 101 mmol/L (ref 98–110)
Creat: 0.97 mg/dL (ref 0.50–1.05)
Globulin: 2.2 g/dL (ref 1.9–3.7)
Glucose, Bld: 103 mg/dL — ABNORMAL HIGH (ref 65–99)
Potassium: 4.3 mmol/L (ref 3.5–5.3)
Sodium: 138 mmol/L (ref 135–146)
Total Bilirubin: 0.6 mg/dL (ref 0.2–1.2)
Total Protein: 6.6 g/dL (ref 6.1–8.1)

## 2024-06-29 LAB — HEMOGLOBIN A1C W/OUT EAG: Hgb A1c MFr Bld: 6 % — ABNORMAL HIGH (ref <5.7)

## 2024-06-30 ENCOUNTER — Ambulatory Visit: Payer: Self-pay | Admitting: Family Medicine

## 2024-07-04 NOTE — Progress Notes (Signed)
 [facility] - Clinic Note  07/11/2024   CHIEF COMPLAINT Patient presents for Retina Follow Up  HISTORY OF PRESENT ILLNESS: Jenna Miller is a 68 y.o. female who presents to the clinic today for:  HPI     Retina Follow Up   In both eyes.  This started 9 months ago.  Duration of months.  Since onset it is stable.  I, the attending physician,  performed the HPI with the patient and updated documentation appropriately.        Comments   9 month retina follow up schisis OU pt is reporting that she is having more trouble with seeing on the computer she saw Dr Fleeta a few months ago and was told her vision was fine she denies any flashes or floaters       Last edited by Valdemar Rogue, MD on 07/11/2024  8:10 AM.    Pt feels like her vision is worse, she states the computer is fuzzy for her, she got new glasses from Dr. Fleeta, but states she can't see out of them   Referring physician: Tonita Fallow, MD [address] [place],  [place] [address]  HISTORICAL INFORMATION:  Selected notes from the MEDICAL RECORD NUMBER Referred by Dr. Fleeta for retinoschisis OU LEE:  Ocular Hx- PMH-   CURRENT MEDICATIONS: No current outpatient medications on file. (Ophthalmic Drugs)   No current facility-administered medications for this visit. (Ophthalmic Drugs)   Current Outpatient Medications (Other)  Medication Sig   BIOTIN 5000 PO Take by mouth. Skin, hair and nails   carbidopa -levodopa  (SINEMET  IR) 25-100 MG tablet Take 1 tablet by mouth 4 (four) times daily. Take 1 at 7/10/1/and 4   Cholecalciferol (VITAMIN D3 PO) Take 5,000 mg by mouth daily.   ezetimibe  (ZETIA ) 10 MG tablet TAKE 1 TABLET BY MOUTH EVERY DAY FOR CHOLESTEROL   famotidine  (PEPCID ) 40 MG tablet TAKE 1 TABLET 2 X /DAY TO PREVENT HEARTBURN & INDIGESTION   Methylcobalamin (B12-ACTIVE) 1 MG CHEW Chew 1 each by mouth daily.   polyethylene glycol (MIRALAX ) 17 g packet Take 17 g by mouth daily as  needed for moderate constipation.   No current facility-administered medications for this visit. (Other)   REVIEW OF SYSTEMS: ROS   Positive for: Gastrointestinal, Neurological, Cardiovascular, Eyes, Allergic/Imm Last edited by Resa Delon ORN, COT on 07/11/2024  7:50 AM.     ALLERGIES Allergies  Allergen Reactions   Lipitor [Atorvastatin] Anaphylaxis and Swelling    Other reaction(s): Other (See Comments) Unknown   Effexor [Venlafaxine]     Other reaction(s): Other (See Comments)   Paxil [Paroxetine Hcl]     Other reaction(s): Other (See Comments)   Wellbutrin [Bupropion] Hives   PAST MEDICAL HISTORY Past Medical History:  Diagnosis Date   Acid reflux    Arthritis    Cancer (HCC)    Left Breast   Cataract    Complication of anesthesia    woke  up X 1   Fatty liver    High cholesterol    Hypertension    Spider bite 2016   Vitamin D  deficiency    Past Surgical History:  Procedure Laterality Date   BREAST SURGERY  1995   mastectomy left   CHOLECYSTECTOMY  11/19/2012   Procedure: LAPAROSCOPIC CHOLECYSTECTOMY WITH INTRAOPERATIVE CHOLANGIOGRAM;  Surgeon: Dann FORBES Hummer, MD;  Location: [facility] OR;  Service: General;  Laterality: N/A;   HERNIA REPAIR     KNEE ARTHROSCOPY  1982  MASTECTOMY Left 1995   SPINE SURGERY  04/04/2024   Cortisone epidural   TRAM  1999-2000   WISDOM TOOTH EXTRACTION     FAMILY HISTORY Family History  Problem Relation Age of Onset   Hypertension Mother    Hyperlipidemia Mother    Gout Father 45       trauma   Cancer Cousin        lung   Hyperlipidemia Brother    Hypertension Brother    COPD Sister    SOCIAL HISTORY Social History   Tobacco Use   Smoking status: Never   Smokeless tobacco: Never  Vaping Use   Vaping status: Never Used  Substance Use Topics   Alcohol use: Not Currently    Alcohol/week: 1.0 standard drink of alcohol    Comment: rare   Drug use: No       OPHTHALMIC EXAM:  Base Eye Exam     Visual  Acuity (Snellen - Linear)       Right Left   Dist cc 20/20 20/20    Correction: Glasses, Contacts         Tonometry (Tonopen)     Unable to assess: Yes         Pupils       Pupils Dark Light Shape React APD   Right PERRL 4 3 Round Brisk None   Left PERRL 4 3 Round Brisk None         Visual Fields       Left Right    Full Full         Extraocular Movement       Right Left    Full, Ortho Full, Ortho         Neuro/Psych     Oriented x3: Yes   Mood/Affect: Normal         Dilation     Both eyes: 2.5% Phenylephrine @ 7:55 AM           Slit Lamp and Fundus Exam     External Exam       Right Left   External Normal Normal         Slit Lamp Exam       Right Left   Lids/Lashes Dermato, Mild MGD Dermato, Mild MGD   Conjunctiva/Sclera White and quiet White and quiet   Cornea Trace tear film debris Mild tear film debris   Anterior Chamber deep and clear deep and clear   Iris Round and dilated Round and dilated   Lens 2-3+ Nuclear sclerosis, 2-3+Cortical cataract 2-3+ Nuclear sclerosis, +2-3 Cortical cataract   Anterior Vitreous mild syneresis mild syneresis         Fundus Exam       Right Left   Disc Pink and sharp Pink and sharp   C/D Ratio 0.3 0.3   Macula Flat, Good foveal reflex, Mild RPE mottling, no heme or edema Flat, Good foveal reflex, Mild RPE mottling, no heme or edema   Vessels mild attenuation, mild tortuosity mild attenuation, mild tortuosity   Periphery Attached, mild reticular degeneration, no heme, shallow schisis IT periphery Attached, no heme, moderate schisis cavity IT periphery spanning [id number]           IMAGING AND PROCEDURES  Imaging and Procedures for 07/11/2024  OCT, Retina - OU - Both Eyes       Right Eye Quality was good. Central Foveal Thickness: 299. Progression has been stable. Findings include normal foveal contour, no IRF, no  SRF, vitreomacular adhesion (Shallow schisis IT periphery -- not  caught on widefield today).   Left Eye Quality was good. Central Foveal Thickness: 297. Progression has been stable. Findings include normal foveal contour, no IRF, no SRF, vitreomacular adhesion (Bullous schisis IT quad -- not caught on widefield today).   Notes *Images captured and stored on drive  Diagnosis / Impression:  OD: Shallow schisis IT periphery -- not caught on widefield today OS: Bullous schisis IT quad -- not caught on widefield today  Clinical management:  See below  Abbreviations: NFP - Normal foveal profile. CME - cystoid macular edema. PED - pigment epithelial detachment. IRF - intraretinal fluid. SRF - subretinal fluid. EZ - ellipsoid zone. ERM - epiretinal membrane. ORA - outer retinal atrophy. ORT - outer retinal tubulation. SRHM - subretinal hyper-reflective material. IRHM - intraretinal hyper-reflective material            ASSESSMENT/PLAN:   ICD-10-CM   1. Bilateral retinoschisis  H33.103 OCT, Retina - OU - Both Eyes    2. Mixed type age-related cataract, both eyes  H25.813      1. Retinoschisis OU -- stable  - BCVA 20/20 OU - stable on exam; no new symptoms - baseline Optos color fundus images obtained for future comparison - discussed findings, prognosis and potential treatment options - no retinal or ophthalmic interventions indicated or recommended at this time - reviewed signs and symptoms of RT/RD -- strict return precautions reviewed - Monitor, pt to contact us  any sooner if VA decreases/changes.  - f/u 9-12 months, DFE, OCT, optos colors  2. Mixed Cataract OU - The symptoms of cataract, surgical options, and treatments and risks were discussed with patient. - discussed diagnosis and progression - monitor  Ophthalmic Meds Ordered this visit:  No orders of the defined types were placed in this encounter.    Return for f/u 9-12 months, retinoschisis OU, DFE, OCT.  There are no Patient Instructions on file for this visit.  Explained the  diagnoses, plan, and follow up with the patient and they expressed understanding.  Patient expressed understanding of the importance of proper follow up care.   This document serves as a record of services personally performed by Redell JUDITHANN Hans, MD, PhD. It was created on their behalf by Avelina Pereyra, COA an ophthalmic technician. The creation of this record is the provider's dictation and/or activities during the visit.   Electronically signed by: Avelina GORMAN Pereyra, COT  07/14/24  10:03 PM   This document serves as a record of services personally performed by Redell JUDITHANN Hans, MD, PhD. It was created on their behalf by Alan PARAS. Delores, OA an ophthalmic technician. The creation of this record is the provider's dictation and/or activities during the visit.    Electronically signed by: Alan PARAS. Delores, OA 07/14/24 10:03 PM  Redell JUDITHANN Hans, M.D., Ph.D. Diseases & Surgery of the Retina and Vitreous [facility] 07/11/2024  I have reviewed the above documentation for accuracy and completeness, and I agree with the above. Redell JUDITHANN Hans, M.D., Ph.D. 07/14/24 10:04 PM   Abbreviations: M myopia (nearsighted); A astigmatism; H hyperopia (farsighted); P presbyopia; Mrx spectacle prescription;  CTL contact lenses; OD right eye; OS left eye; OU both eyes  XT exotropia; ET esotropia; PEK punctate epithelial keratitis; PEE punctate epithelial erosions; DES dry eye syndrome; MGD meibomian gland dysfunction; ATs artificial tears; PFAT's preservative free artificial tears; NSC nuclear sclerotic cataract; PSC posterior subcapsular cataract; ERM epi-retinal membrane; PVD  posterior vitreous detachment; RD retinal detachment; DM diabetes mellitus; DR diabetic retinopathy; NPDR non-proliferative diabetic retinopathy; PDR proliferative diabetic retinopathy; CSME clinically significant macular edema; DME diabetic macular edema; dbh dot blot hemorrhages; CWS cotton wool spot; POAG primary open angle  glaucoma; C/D cup-to-disc ratio; HVF humphrey visual field; GVF goldmann visual field; OCT optical coherence tomography; IOP intraocular pressure; BRVO Branch retinal vein occlusion; CRVO central retinal vein occlusion; CRAO central retinal artery occlusion; BRAO branch retinal artery occlusion; RT retinal tear; SB scleral buckle; PPV pars plana vitrectomy; VH Vitreous hemorrhage; PRP panretinal laser photocoagulation; IVK intravitreal kenalog ; VMT vitreomacular traction; MH Macular hole;  NVD neovascularization of the disc; NVE neovascularization elsewhere; AREDS age related eye disease study; ARMD age related macular degeneration; POAG primary open angle glaucoma; EBMD epithelial/anterior basement membrane dystrophy; ACIOL anterior chamber intraocular lens; IOL intraocular lens; PCIOL posterior chamber intraocular lens; Phaco/IOL phacoemulsification with intraocular lens placement; PRK photorefractive keratectomy; LASIK laser assisted in situ keratomileusis; HTN hypertension; DM diabetes mellitus; COPD chronic obstructive pulmonary disease

## 2024-07-11 ENCOUNTER — Encounter (INDEPENDENT_AMBULATORY_CARE_PROVIDER_SITE_OTHER): Payer: Self-pay | Admitting: Ophthalmology

## 2024-07-11 ENCOUNTER — Ambulatory Visit (INDEPENDENT_AMBULATORY_CARE_PROVIDER_SITE_OTHER): Payer: BC Managed Care – PPO | Admitting: Ophthalmology

## 2024-07-11 DIAGNOSIS — H33103 Unspecified retinoschisis, bilateral: Secondary | ICD-10-CM | POA: Diagnosis not present

## 2024-07-11 DIAGNOSIS — H25813 Combined forms of age-related cataract, bilateral: Secondary | ICD-10-CM

## 2024-08-02 ENCOUNTER — Ambulatory Visit: Payer: Medicare HMO | Admitting: Neurology

## 2024-08-10 ENCOUNTER — Telehealth: Payer: Self-pay | Admitting: Gastroenterology

## 2024-08-10 NOTE — Telephone Encounter (Signed)
(  For documentation purposes: Last colonoscopy Dr. Luis October 2017 Tubular adenoma removed-5-year recall recommended)  Primary care note April of this year indicates chronic constipation believed related to patient's Parkinson's disease, using MiraLAX   ____________  This patient is due for surveillance colonoscopy for history of colon polyps. It can be directly booked with me in the [facility],  And if she is not moving her bowels daily with the use of MiraLAX , then she needs a GoLytely  bowel preparation for the procedure.  VEAR Brand MD

## 2024-08-10 NOTE — Telephone Encounter (Signed)
 Good Afternoon Dr Legrand   Supervising MD PM     We received a referral for patient to be seen for a colonoscopy. Previous procedure 10/06/2016 with Dr Luis.  Please review available records in epic and advise on scheduling.   Thank you

## 2024-08-26 ENCOUNTER — Encounter: Payer: Self-pay | Admitting: Gastroenterology

## 2024-08-26 NOTE — Telephone Encounter (Signed)
Patient scheduled for pre visit and procedure.

## 2024-09-01 ENCOUNTER — Telehealth: Payer: Self-pay | Admitting: Neurology

## 2024-09-01 NOTE — Telephone Encounter (Signed)
 Pt called and LM. She wants to speak with [facility], she has a question. She only wants to talk with chelsea.

## 2024-09-02 NOTE — Telephone Encounter (Signed)
 Pt called again with same concern She wants to speak with [facility], she has a question. She only wants to talk with chelsea.

## 2024-09-02 NOTE — Telephone Encounter (Signed)
 Spoke to patient she is unable to sleep for 4-5 days a week thinks that since she started taking carbidopa   levodopa  . She is taking melatonin every night but it is not helping. She is up all night walking around the house with her legs hurting and cramping. She is still having episodes with nausea around 11

## 2024-09-06 ENCOUNTER — Other Ambulatory Visit: Payer: Self-pay | Admitting: Nurse Practitioner

## 2024-09-06 ENCOUNTER — Other Ambulatory Visit: Payer: Self-pay | Admitting: Neurology

## 2024-09-06 DIAGNOSIS — G20B1 Parkinson's disease with dyskinesia, without mention of fluctuations: Secondary | ICD-10-CM

## 2024-09-06 DIAGNOSIS — K219 Gastro-esophageal reflux disease without esophagitis: Secondary | ICD-10-CM

## 2024-09-07 ENCOUNTER — Telehealth: Payer: Self-pay | Admitting: Family Medicine

## 2024-09-07 ENCOUNTER — Telehealth: Payer: Self-pay | Admitting: Neurology

## 2024-09-07 ENCOUNTER — Other Ambulatory Visit: Payer: Self-pay | Admitting: Family Medicine

## 2024-09-07 DIAGNOSIS — K219 Gastro-esophageal reflux disease without esophagitis: Secondary | ICD-10-CM

## 2024-09-07 NOTE — Telephone Encounter (Signed)
 Pt called in this afternoon and she stated that [facility] will not refill her [facility] for   carbidopa -levodopa  (SINEMET  IR) 25-100 MG tablet  Pt stated that [facility] stated that the Doctor needs to send in their profile in order to get the prescriptions filled. Pt stated that [facility] stated that they reached out to our office about  the doctor profile and no responses. Thanks

## 2024-09-07 NOTE — Telephone Encounter (Signed)
 Copied from CRM [id number]. Topic: General - Other >> Sep 07, 2024  8:32 AM Mesmerise C wrote: Reason for CRM: Patient stated that the [facility]oscopy center needs a referral letter or form for her insurance

## 2024-09-07 NOTE — Telephone Encounter (Unsigned)
 Copied from CRM #[id number]. Topic: Clinical - Medication Refill >> Sep 07, 2024  3:12 PM Suzen RAMAN wrote: Medication: ezetimibe  (ZETIA ) 10 MG tablet famotidine  (PEPCID ) 40 MG tablet 90 days supply   Has the patient contacted their pharmacy? Yes  This is the patient's preferred pharmacy:  [id number] [place], [place] - [address] [place] [address] Phone: [phone] Fax: [phone]   Is this the correct pharmacy for this prescription? Yes If no, delete pharmacy and type the correct one.   Has the prescription been filled recently? No  Is the patient out of the medication? No  Has the patient been seen for an appointment in the last year OR does the patient have an upcoming appointment? Yes  Can we respond through MyChart? Yes  Agent: Please be advised that Rx refills may take up to 3 business days. We ask that you follow-up with your pharmacy.

## 2024-09-07 NOTE — Telephone Encounter (Signed)
 FYI Only or Action Required?: Action required by provider: medication refill request.  Patient was last seen in primary care on 06/28/2024 by Sebastian Beverley NOVAK, MD.  Called Nurse Triage reporting No chief complaint on file..  Symptoms began today.  Interventions attempted: Nothing.  Symptoms are: stable.  Triage Disposition: No disposition on file.  Patient/caregiver understands and will follow disposition?:

## 2024-09-08 MED ORDER — EZETIMIBE 10 MG PO TABS: ORAL_TABLET | ORAL | 3 refills | Status: AC

## 2024-09-08 MED ORDER — FAMOTIDINE 40 MG PO TABS: ORAL_TABLET | ORAL | 3 refills | Status: AC

## 2024-09-08 NOTE — Telephone Encounter (Signed)
 Meds were refilled

## 2024-09-12 ENCOUNTER — Other Ambulatory Visit: Payer: Self-pay | Admitting: Neurology

## 2024-09-12 DIAGNOSIS — G20B1 Parkinson's disease with dyskinesia, without mention of fluctuations: Secondary | ICD-10-CM

## 2024-10-31 ENCOUNTER — Encounter: Payer: Self-pay | Admitting: Radiology

## 2024-10-31 ENCOUNTER — Encounter: Payer: BC Managed Care – PPO | Admitting: Nurse Practitioner

## 2024-11-02 ENCOUNTER — Ambulatory Visit (AMBULATORY_SURGERY_CENTER)

## 2024-11-02 VITALS — Ht 61.0 in | Wt 175.0 lb

## 2024-11-02 DIAGNOSIS — Z860100 Personal history of colon polyps, unspecified: Secondary | ICD-10-CM

## 2024-11-02 MED ORDER — PEG 3350-KCL-NA BICARB-NACL 420 G PO SOLR: 4000.0000 mL | Freq: Once | ORAL | 0 refills | Status: AC

## 2024-11-02 NOTE — Progress Notes (Signed)
 No egg or soy allergy known to patient  No issues known to pt with past sedation with any surgeries or procedures Patient denies ever being told they had issues or difficulty with intubation  No FH of Malignant Hyperthermia Pt is not on diet pills Pt is not on  home 02  Pt is not on blood thinners  Pt has issues with constipation, not taking meds and has daily BM's, Golytely  sent via ERX per Dr. Legrand  No A fib or A flutter Have any cardiac testing pending--No Pt can ambulate ; cane assist Pt denies use of chewing tobacco Discussed diabetic I weight loss medication holds Discussed NSAID holds Checked BMI Pt instructed to use Single[employer] or GoodRx for a price reduction on prep  Patient's chart reviewed by Norleen Schillings CNRA prior to previsit and patient appropriate for the [facility].  Pre visit completed and red dot placed by patient's name on their procedure day (on provider's schedule).

## 2024-11-05 DIAGNOSIS — R051 Acute cough: Secondary | ICD-10-CM | POA: Diagnosis not present

## 2024-11-11 ENCOUNTER — Encounter: Payer: Self-pay | Admitting: Gastroenterology

## 2024-11-11 ENCOUNTER — Telehealth: Payer: Self-pay | Admitting: Gastroenterology

## 2024-11-11 NOTE — Telephone Encounter (Signed)
 Noted. Pt is ok to proceed

## 2024-11-11 NOTE — Telephone Encounter (Signed)
 Inbound call from patient stating last week she had a sinus infection and was put on antibiotics. She states she is better she just wanted to let us  know.

## 2024-11-11 NOTE — Telephone Encounter (Signed)
 Patient's insurance called requesting a call back to patient with codes for upcoming colonoscopy. Please advise, thank you

## 2024-11-14 NOTE — Telephone Encounter (Signed)
 Inbound call from patient stating that she spoke with her insurance company this morning and they have not received an codes in order to cover this patient procedure. I did advise patient that it seem we did contact her insurance but patient is requesting to speak to our pre-cert department. Please advise.

## 2024-11-17 ENCOUNTER — Ambulatory Visit: Admitting: Gastroenterology

## 2024-11-17 ENCOUNTER — Encounter: Payer: Self-pay | Admitting: Gastroenterology

## 2024-11-17 ENCOUNTER — Telehealth: Payer: Self-pay | Admitting: Gastroenterology

## 2024-11-17 VITALS — BP 134/69 | HR 92 | Temp 98.2°F | Resp 12 | Ht 61.0 in | Wt 175.0 lb

## 2024-11-17 DIAGNOSIS — Z1211 Encounter for screening for malignant neoplasm of colon: Secondary | ICD-10-CM

## 2024-11-17 DIAGNOSIS — D120 Benign neoplasm of cecum: Secondary | ICD-10-CM | POA: Diagnosis not present

## 2024-11-17 DIAGNOSIS — K760 Fatty (change of) liver, not elsewhere classified: Secondary | ICD-10-CM | POA: Diagnosis not present

## 2024-11-17 DIAGNOSIS — K5730 Diverticulosis of large intestine without perforation or abscess without bleeding: Secondary | ICD-10-CM

## 2024-11-17 DIAGNOSIS — E7800 Pure hypercholesterolemia, unspecified: Secondary | ICD-10-CM | POA: Diagnosis not present

## 2024-11-17 DIAGNOSIS — I10 Essential (primary) hypertension: Secondary | ICD-10-CM | POA: Diagnosis not present

## 2024-11-17 DIAGNOSIS — Z860101 Personal history of adenomatous and serrated colon polyps: Secondary | ICD-10-CM

## 2024-11-17 DIAGNOSIS — Z860100 Personal history of colon polyps, unspecified: Secondary | ICD-10-CM

## 2024-11-17 MED ORDER — SODIUM CHLORIDE 0.9 % IV SOLN: 500.0000 mL | Freq: Once | INTRAVENOUS | Status: DC

## 2024-11-17 NOTE — Progress Notes (Signed)
 Vss nad trans to pacu

## 2024-11-17 NOTE — Op Note (Addendum)
 [name] Procedure Date: 11/17/2024 7:13 AM MRN: [id number] Endoscopist: Victory L. Legrand , MD, [id number] Age: 68 Referring MD:  Date of Birth: 19-Sep-1956 Gender: Female Account #: [id number] Procedure:                Colonoscopy Indications:              Surveillance: Personal history of adenomatous                            polyps on last colonoscopy > 5 years ago                           Tubular adenoma October 2017 (Dr. Luis) Medicines:                Monitored Anesthesia Care Procedure:                Pre-Anesthesia Assessment:                           - Prior to the procedure, a History and Physical                            was performed, and patient medications and                            allergies were reviewed. The patient's tolerance of                            previous anesthesia was also reviewed. The risks                            and benefits of the procedure and the sedation                            options and risks were discussed with the patient.                            All questions were answered, and informed consent                            was obtained. Prior Anticoagulants: The patient has                            taken no anticoagulant or antiplatelet agents. ASA                            Grade Assessment: II - A patient with mild systemic                            disease. After reviewing the risks and benefits,                            the patient was deemed in satisfactory condition to  undergo the procedure.                           After obtaining informed consent, the colonoscope                            was passed under direct vision. Throughout the                            procedure, the patient's blood pressure, pulse, and                            oxygen saturations were monitored continuously. The                            Olympus Scope [id number]  was introduced through the                            anus and advanced to the the cecum, identified by                            appendiceal orifice and ileocecal valve. The                            colonoscopy was performed without difficulty. The                            patient tolerated the procedure well. The quality                            of the bowel preparation was good. The ileocecal                            valve, appendiceal orifice, and rectum were                            photographed. The bowel preparation used was                            GoLYTELY  (chronic constipation). Scope In: 8:13:33 AM Scope Out: 8:29:46 AM Scope Withdrawal Time: 0 hours 12 minutes 58 seconds  Total Procedure Duration: 0 hours 16 minutes 13 seconds  Findings:                 The perianal and digital rectal examinations were                            normal.                           Repeat examination of right colon under NBI                            performed.  A diminutive polyp was found in the cecum. The                            polyp was sessile. The polyp was removed with a                            cold snare. Resection and retrieval were complete.                           A few small-mouthed diverticula were found in the                            left colon and right colon.                           The exam was otherwise without abnormality on                            direct and retroflexion views. Complications:            No immediate complications. Estimated Blood Loss:     Estimated blood loss was minimal. Impression:               - One diminutive polyp in the cecum, removed with a                            cold snare. Resected and retrieved.                           - Diverticulosis in the left colon and in the right                            colon.                           - The examination was otherwise normal on direct                             and retroflexion views. Recommendation:           - Patient has a contact number available for                            emergencies. The signs and symptoms of potential                            delayed complications were discussed with the                            patient. Return to normal activities tomorrow.                            Written discharge instructions were provided to the  patient.                           - Resume previous diet.                           - Continue present medications.                           - Await pathology results.                           - Repeat colonoscopy is recommended for                            surveillance. The colonoscopy date will be                            determined after pathology results from today's                            exam become available for review. Christin Moline L. Legrand, MD 11/17/2024 8:38:25 AM This report has been signed electronically.

## 2024-11-17 NOTE — Patient Instructions (Addendum)
 Handouts given: Polyps, Diverticulosis Resume previous diet. Continue present medications.  Await pathology results. Repeat colonoscopy is recommended for surveillance. The colonoscopy date will be determined after pathology results from today's exam become available for review.  YOU HAD AN ENDOSCOPIC PROCEDURE TODAY AT THE [facility]:   Refer to the procedure report that was given to you for any specific questions about what was found during the examination.  If the procedure report does not answer your questions, please call your gastroenterologist to clarify.  If you requested that your care partner not be given the details of your procedure findings, then the procedure report has been included in a sealed envelope for you to review at your convenience later.  YOU SHOULD EXPECT: Some feelings of bloating in the abdomen. Passage of more gas than usual.  Walking can help get rid of the air that was put into your GI tract during the procedure and reduce the bloating. If you had a lower endoscopy (such as a colonoscopy or flexible sigmoidoscopy) you may notice spotting of blood in your stool or on the toilet paper. If you underwent a bowel prep for your procedure, you may not have a normal bowel movement for a few days.  Please Note:  You might notice some irritation and congestion in your nose or some drainage.  This is from the oxygen used during your procedure.  There is no need for concern and it should clear up in a day or so.  SYMPTOMS TO REPORT IMMEDIATELY:  Following lower endoscopy (colonoscopy or flexible sigmoidoscopy):  Excessive amounts of blood in the stool  Significant tenderness or worsening of abdominal pains  Swelling of the abdomen that is new, acute  Fever of 100F or higher  For urgent or emergent issues, a gastroenterologist can be reached at any hour by calling [phone]. Do not use MyChart messaging for urgent concerns.    DIET:  We do recommend a  small meal at first, but then you may proceed to your regular diet.  Drink plenty of fluids but you should avoid alcoholic beverages for 24 hours.  ACTIVITY:  You should plan to take it easy for the rest of today and you should NOT DRIVE or use heavy machinery until tomorrow (because of the sedation medicines used during the test).    FOLLOW UP: Our staff will call the number listed on your records the next business day following your procedure.  We will call around 7:15- 8:00 am to check on you and address any questions or concerns that you may have regarding the information given to you following your procedure. If we do not reach you, we will leave a message.     If any biopsies were taken you will be contacted by phone or by letter within the next 1-3 weeks.  Please call us  at [phone] if you have not heard about the biopsies in 3 weeks.    SIGNATURES/CONFIDENTIALITY: You and/or your care partner have signed paperwork which will be entered into your electronic medical record.  These signatures attest to the fact that that the information above on your After Visit Summary has been reviewed and is understood.  Full responsibility of the confidentiality of this discharge information lies with you and/or your care-partner.

## 2024-11-17 NOTE — Progress Notes (Signed)
 History and Physical:  This patient presents for endoscopic testing for: Encounter Diagnosis  Name Primary?   Hx of colonic polyps Yes    68 year old woman here today for surveillance colonoscopy. Tubular adenoma last colonoscopy by Dr. Luis October 2017 (see records). Intermittent constipation, otherwise without chronic GI symptoms.  Patient is otherwise without complaints or active issues today.   Past Medical History: Past Medical History:  Diagnosis Date   Acid reflux    Arthritis    Cancer (HCC)    Left Breast   Cataract    Complication of anesthesia    woke  up X 1   Fatty liver    High cholesterol    Hypertension    Spider bite 2016   Vitamin D  deficiency      Past Surgical History: Past Surgical History:  Procedure Laterality Date   BREAST SURGERY  1995   mastectomy left   CHOLECYSTECTOMY  11/19/2012   Procedure: LAPAROSCOPIC CHOLECYSTECTOMY WITH INTRAOPERATIVE CHOLANGIOGRAM;  Surgeon: Dann FORBES Hummer, MD;  Location: [facility] OR;  Service: General;  Laterality: N/A;   COLONOSCOPY     HERNIA REPAIR     KNEE ARTHROSCOPY  1982   MASTECTOMY Left 1995   SPINE SURGERY  04/04/2024   Cortisone epidural   TRAM  1999-2000   WISDOM TOOTH EXTRACTION      Allergies: Allergies  Allergen Reactions   Effexor [Venlafaxine] Swelling and Other (See Comments)    Other reaction(s): Other (See Comments) Throat swelling   Lipitor [Atorvastatin] Anaphylaxis and Swelling    Other reaction(s): Other (See Comments) Unknown   Paxil [Paroxetine Hcl] Swelling    Other reaction(s): Other (See Comments) Throat swelling   Wellbutrin [Bupropion] Hives    Outpatient Meds: Current Outpatient Medications  Medication Sig Dispense Refill   carbidopa -levodopa  (SINEMET  IR) 25-100 MG tablet TAKE 1 TABLET BY MOUTH 3 (THREE) TIMES DAILY. 7AM/11AM/4PM 270 tablet 0   Cholecalciferol (VITAMIN D3 PO) Take 5,000 mg by mouth daily.     ezetimibe  (ZETIA ) 10 MG tablet TAKE 1 TABLET BY MOUTH  EVERY DAY FOR CHOLESTEROL 90 tablet 3   famotidine  (PEPCID ) 40 MG tablet TAKE 1 TABLET 2 X /DAY TO PREVENT HEARTBURN & INDIGESTION 180 tablet 3   Methylcobalamin (B12-ACTIVE) 1 MG CHEW Chew 1 each by mouth daily.     BIOTIN 5000 PO Take by mouth. Skin, hair and nails     gabapentin  (NEURONTIN ) 100 MG capsule Take 100 mg by mouth at bedtime as needed.     polyethylene glycol (MIRALAX ) 17 g packet Take 17 g by mouth daily as needed for moderate constipation. 30 each 0   Current Facility-Administered Medications  Medication Dose Route Frequency Provider Last Rate Last Admin   0.9 %  sodium chloride  infusion  500 mL Intravenous Once Danis, Victory CROME III, MD          ___________________________________________________________________ Objective   Exam:  BP 136/78   Pulse 74   Temp 98.2 F (36.8 C) (Skin)   Ht 5' 1 (1.549 m)   Wt 175 lb (79.4 kg)   LMP 06/10/2010   SpO2 99%   BMI 33.07 kg/m   CV: regular , S1/S2 Resp: clear to auscultation bilaterally, normal RR and effort noted GI: soft, no tenderness, with active bowel sounds.   Assessment: Encounter Diagnosis  Name Primary?   Hx of colonic polyps Yes     Plan: Colonoscopy   The benefits and risks of the planned procedure(s) were described in detail with  the patient or (when appropriate) their health care proxy.  Risks were outlined as including, but not limited to, bleeding, infection, perforation, adverse medication reaction leading to cardiac or pulmonary decompensation, pancreatitis (if ERCP).  The limitation of incomplete mucosal visualization was also discussed.  No guarantees or warranties were given.  The patient was provided an opportunity to ask questions and all were answered. The patient agreed with the plan.   The patient is appropriate for an endoscopic procedure in the ambulatory setting.   - Victory Brand, MD

## 2024-11-17 NOTE — Progress Notes (Signed)
 Pt's states no medical or surgical changes since previsit or office visit.

## 2024-11-18 ENCOUNTER — Telehealth: Payer: Self-pay

## 2024-11-18 NOTE — Telephone Encounter (Signed)
  Follow up Call-     11/17/2024    7:28 AM  Call back number  Post procedure Call Back phone  # [phone]  Permission to leave phone message Yes     Patient questions:  Do you have a fever, pain , or abdominal swelling? No. Pain Score  0 *  Have you tolerated food without any problems? Yes.    Have you been able to return to your normal activities? Yes.    Do you have any questions about your discharge instructions: Diet   No. Medications  No. Follow up visit  No.  Do you have questions or concerns about your Care? No.  Actions: * If pain score is 4 or above: No action needed, pain <4.

## 2024-11-21 LAB — SURGICAL PATHOLOGY

## 2024-11-23 ENCOUNTER — Ambulatory Visit: Payer: Self-pay | Admitting: Gastroenterology

## 2024-12-30 ENCOUNTER — Ambulatory Visit: Admitting: Family Medicine

## 2025-01-03 DIAGNOSIS — G20B1 Parkinson's disease with dyskinesia, without mention of fluctuations: Secondary | ICD-10-CM

## 2025-01-04 ENCOUNTER — Ambulatory Visit: Admitting: Family Medicine

## 2025-01-05 ENCOUNTER — Ambulatory Visit: Admitting: Family Medicine

## 2025-01-05 ENCOUNTER — Encounter: Payer: Self-pay | Admitting: Family Medicine

## 2025-01-05 VITALS — BP 108/80 | HR 85 | Temp 98.1°F | Ht 61.0 in | Wt 185.2 lb

## 2025-01-05 DIAGNOSIS — G20B2 Parkinson's disease with dyskinesia, with fluctuations: Secondary | ICD-10-CM | POA: Diagnosis not present

## 2025-01-05 DIAGNOSIS — R7303 Prediabetes: Secondary | ICD-10-CM

## 2025-01-05 DIAGNOSIS — Z6835 Body mass index (BMI) 35.0-35.9, adult: Secondary | ICD-10-CM

## 2025-01-05 DIAGNOSIS — K760 Fatty (change of) liver, not elsewhere classified: Secondary | ICD-10-CM

## 2025-01-05 DIAGNOSIS — K5909 Other constipation: Secondary | ICD-10-CM

## 2025-01-05 DIAGNOSIS — E66812 Obesity, class 2: Secondary | ICD-10-CM | POA: Insufficient documentation

## 2025-01-05 DIAGNOSIS — N182 Chronic kidney disease, stage 2 (mild): Secondary | ICD-10-CM

## 2025-01-05 DIAGNOSIS — E559 Vitamin D deficiency, unspecified: Secondary | ICD-10-CM

## 2025-01-05 DIAGNOSIS — E538 Deficiency of other specified B group vitamins: Secondary | ICD-10-CM | POA: Insufficient documentation

## 2025-01-05 DIAGNOSIS — E782 Mixed hyperlipidemia: Secondary | ICD-10-CM | POA: Diagnosis not present

## 2025-01-05 DIAGNOSIS — R112 Nausea with vomiting, unspecified: Secondary | ICD-10-CM | POA: Insufficient documentation

## 2025-01-05 DIAGNOSIS — Z853 Personal history of malignant neoplasm of breast: Secondary | ICD-10-CM

## 2025-01-05 DIAGNOSIS — K219 Gastro-esophageal reflux disease without esophagitis: Secondary | ICD-10-CM | POA: Diagnosis not present

## 2025-01-05 DIAGNOSIS — R7309 Other abnormal glucose: Secondary | ICD-10-CM | POA: Insufficient documentation

## 2025-01-05 DIAGNOSIS — Z9989 Dependence on other enabling machines and devices: Secondary | ICD-10-CM | POA: Insufficient documentation

## 2025-01-05 DIAGNOSIS — I10 Essential (primary) hypertension: Secondary | ICD-10-CM

## 2025-01-05 DIAGNOSIS — R339 Retention of urine, unspecified: Secondary | ICD-10-CM | POA: Insufficient documentation

## 2025-01-05 DIAGNOSIS — Z79899 Other long term (current) drug therapy: Secondary | ICD-10-CM

## 2025-01-05 LAB — CBC WITH DIFFERENTIAL/PLATELET
Basophils Absolute: 0 K/uL (ref 0.0–0.1)
Basophils Relative: 0.7 % (ref 0.0–3.0)
Eosinophils Absolute: 0.1 K/uL (ref 0.0–0.7)
Eosinophils Relative: 0.8 % (ref 0.0–5.0)
HCT: 37.2 % (ref 36.0–46.0)
Hemoglobin: 12.4 g/dL (ref 12.0–15.0)
Lymphocytes Relative: 15.8 % (ref 12.0–46.0)
Lymphs Abs: 1 K/uL (ref 0.7–4.0)
MCHC: 33.3 g/dL (ref 30.0–36.0)
MCV: 89.8 fl (ref 78.0–100.0)
Monocytes Absolute: 0.5 K/uL (ref 0.1–1.0)
Monocytes Relative: 7.4 % (ref 3.0–12.0)
Neutro Abs: 4.9 K/uL (ref 1.4–7.7)
Neutrophils Relative %: 75.3 % (ref 43.0–77.0)
Platelets: 241 K/uL (ref 150.0–400.0)
RBC: 4.14 Mil/uL (ref 3.87–5.11)
RDW: 14.2 % (ref 11.5–15.5)
WBC: 6.5 K/uL (ref 4.0–10.5)

## 2025-01-05 LAB — LIPID PANEL
Cholesterol: 183 mg/dL (ref 28–200)
HDL: 76.1 mg/dL
LDL Cholesterol: 92 mg/dL (ref 10–99)
NonHDL: 106.65
Total CHOL/HDL Ratio: 2
Triglycerides: 72 mg/dL (ref 10.0–149.0)
VLDL: 14.4 mg/dL (ref 0.0–40.0)

## 2025-01-05 LAB — VITAMIN B12: Vitamin B-12: >1500 pg/mL — ABNORMAL HIGH (ref 211–911)

## 2025-01-05 MED ORDER — ONDANSETRON 4 MG PO TBDP: 4.0000 mg | ORAL_TABLET | Freq: Three times a day (TID) | ORAL | 0 refills | Status: AC | PRN

## 2025-01-05 MED ORDER — PANTOPRAZOLE SODIUM 40 MG PO TBEC: DELAYED_RELEASE_TABLET | ORAL | 3 refills | Status: AC

## 2025-01-05 NOTE — Progress Notes (Deleted)
 " Assessment & Plan   Assessment/Plan:    Problem List Items Addressed This Visit   None       Assessment and Plan               There are no discontinued medications.  No follow-ups on file.        Subjective:   Encounter date: 01/05/2025  Jenna Miller is a 69 y.o. female who has Essential hypertension; Hyperlipidemia; GERD; Vitamin D  deficiency; Fatty liver; CKD Stage 2 (GFR 69 ml/min); Medication management; Pre-diabetes; Morbid Obesity (BMI 32.54); Breast cancer, Left; Constipation; Dysfunction of right eustachian tube; History of adenomatous polyp of colon; Parkinson's disease (HCC); Arm pain; Chronic midline low back pain; and Spinal stenosis of lumbosacral region on their problem list..   She  has a past medical history of Acid reflux, Arthritis, Cancer (HCC), Cataract, Complication of anesthesia, Fatty liver, High cholesterol, Hypertension, Spider bite (2016), and Vitamin D  deficiency..   She presents with chief complaint of No chief complaint on file. .   Discussed the use of AI scribe software for clinical note transcription with the patient, who gave verbal consent to proceed.  History of Present Illness          Parkinson's Disease - Managed with carbidopa -levodopa  25-100 mg 4 times daily.   Hyperglycemia - Not managed with medication per patient preference. - Last hgbA1c was 6.0    Hypertension - Controlled with lifestyle modifications.  - Last BP in November 2025 was 134/69 mmHg.     Hyperlipidemia - Not currently managed with medication.  - LDL in July 2025 was 123 mg/dL.    ROS  Past Surgical History:  Procedure Laterality Date   BREAST SURGERY  1995   mastectomy left   CHOLECYSTECTOMY  11/19/2012   Procedure: LAPAROSCOPIC CHOLECYSTECTOMY WITH INTRAOPERATIVE CHOLANGIOGRAM;  Surgeon: Dann FORBES Hummer, MD;  Location: [facility] OR;  Service: General;  Laterality: N/A;   COLONOSCOPY     HERNIA REPAIR     KNEE ARTHROSCOPY  1982    MASTECTOMY Left 1995   SPINE SURGERY  04/04/2024   Cortisone epidural   TRAM  1999-2000   WISDOM TOOTH EXTRACTION      Medications Ordered Prior to Encounter[1]  Family History  Problem Relation Age of Onset   Hypertension Mother    Hyperlipidemia Mother    Gout Father 53       trauma   COPD Sister    Hyperlipidemia Brother    Hypertension Brother    Cancer Cousin        lung   Colon cancer Neg Hx    Rectal cancer Neg Hx    Stomach cancer Neg Hx    Esophageal cancer Neg Hx     Social History   Socioeconomic History   Marital status: Divorced    Spouse name: Not on file   Number of children: Not on file   Years of education: Not on file   Highest education level: Some college, no degree  Occupational History   Not on file  Tobacco Use   Smoking status: Never   Smokeless tobacco: Never  Vaping Use   Vaping status: Never Used  Substance and Sexual Activity   Alcohol use: Not Currently    Alcohol/week: 1.0 standard drink of alcohol    Comment: rare   Drug use: No   Sexual activity: Not on file  Other Topics Concern   Not on file  Social History Narrative   Right  handed    Social Drivers of Health   Tobacco Use: Low Risk (11/17/2024)   Patient History    Smoking Tobacco Use: Never    Smokeless Tobacco Use: Never    Passive Exposure: Not on file  Financial Resource Strain: Low Risk (01/02/2025)   Overall Financial Resource Strain (CARDIA)    Difficulty of Paying Living Expenses: Not hard at all  Food Insecurity: No Food Insecurity (01/02/2025)   Epic    Worried About Radiation Protection Practitioner of Food in the Last Year: Never true    Ran Out of Food in the Last Year: Never true  Transportation Needs: No Transportation Needs (01/02/2025)   Epic    Lack of Transportation (Medical): No    Lack of Transportation (Non-Medical): No  Physical Activity: Unknown (06/27/2024)   Exercise Vital Sign    Days of Exercise per Week: Patient declined    Minutes of Exercise per Session: Not  on file  Stress: No Stress Concern Present (01/02/2025)   [employer] of Occupational Health - Occupational Stress Questionnaire    Feeling of Stress: Only a little  Social Connections: Unknown (01/02/2025)   Social Connection and Isolation Panel    Frequency of Communication with Friends and Family: More than three times a week    Frequency of Social Gatherings with Friends and Family: Not on file    Attends Religious Services: Not on file    Active Member of Clubs or Organizations: No    Attends Banker Meetings: Not on file    Marital Status: Divorced  Intimate Partner Violence: Not on file  Depression (PHQ2-9): Low Risk (03/29/2024)   Depression (PHQ2-9)    PHQ-2 Score: 0  Alcohol Screen: Not on file  Housing: Unknown (01/02/2025)   Epic    Unable to Pay for Housing in the Last Year: No    Number of Times Moved in the Last Year: Not on file    Homeless in the Last Year: Not on file  Utilities: Not on file  Health Literacy: Not on file                                                                                                  Objective:  Physical Exam: LMP 06/10/2010    Physical Exam           Physical Exam  No results found.  Recent Results (from the past 2160 hours)  Surgical pathology ([facility])     Status: None   Collection Time: 11/17/24 12:00 AM  Result Value Ref Range   SURGICAL PATHOLOGY      SURGICAL PATHOLOGY [facility] [address] [place], [place] [address] Telephone [phone] or [phone] Fax [phone]  REPORT OF SURGICAL PATHOLOGY   Accession #: [id number] Patient Name: Jenna, Miller Visit # : [id number]  MRN: [id number] Physician: Legrand Shove DOB/Age October 31, 1956 (Age: 20) Gender: F Collected Date: 11/17/2024 Received Date: 11/18/2024  FINAL DIAGNOSIS       1. Surgical [P], colon, cecum, polyp (1) :       -  TUBULAR ADENOMA.       DATE SIGNED OUT:  11/21/2024 ELECTRONIC SIGNATURE : Coronel Md, Misti, Sports Administrator, International Aid/development Worker  MICROSCOPIC DESCRIPTION  CASE COMMENTS STAINS USED IN DIAGNOSIS: H&E    CLINICAL HISTORY  SPECIMEN(S) OBTAINED 1. Surgical [P], Colon, Cecum, Polyp (1)  SPECIMEN COMMENTS: 1. Hx of colonic polyps; benign neoplasm of cecum SPECIMEN CLINICAL INFORMATION: 1. R/O adenoma    Gross Description 1. Received in formalin are tan, soft tissue fragment s that are submitted in toto. Number: 1 Size: 0.6 cm, (1B) ( TA )        Report signed out from the following location(s) [facility] [address] [place], [place] [address] CLIA #: [id number]  [facility] [address] [place], [place] [address] CLIA #: [id number]         Beverley KATHEE Hummer, MD  I,Emily Lagle,acting as a scribe for Beverley KATHEE Hummer, MD.,have documented all relevant documentation on the behalf of Beverley KATHEE Hummer, MD.  LILLETTE Beverley KATHEE Hummer, MD, have reviewed all documentation for this visit. The documentation on 01/05/2025 for the exam, diagnosis, procedures, and orders are all accurate and complete.     [1]  Current Outpatient Medications on File Prior to Visit  Medication Sig Dispense Refill   BIOTIN 5000 PO Take by mouth. Skin, hair and nails     carbidopa -levodopa  (SINEMET  IR) 25-100 MG tablet TAKE 1 TABLET BY MOUTH 4 (FOUR) TIMES DAILY. TAKE 1 AT 7/10/1/AND 4 360 tablet 00   Cholecalciferol (VITAMIN D3 PO) Take 5,000 mg by mouth daily.     ezetimibe  (ZETIA ) 10 MG tablet TAKE 1 TABLET BY MOUTH EVERY DAY FOR CHOLESTEROL 90 tablet 3   famotidine  (PEPCID ) 40 MG tablet TAKE 1 TABLET 2 X /DAY TO PREVENT HEARTBURN & INDIGESTION 180 tablet 3   gabapentin  (NEURONTIN ) 100 MG capsule Take 100 mg by mouth at bedtime as needed.     Methylcobalamin (B12-ACTIVE) 1 MG CHEW Chew 1 each by mouth daily.     polyethylene glycol (MIRALAX ) 17 g packet Take 17 g by mouth daily as needed for moderate  constipation. 30 each 0   No current facility-administered medications on file prior to visit.   "

## 2025-01-05 NOTE — Progress Notes (Signed)
 " Assessment & Plan   Assessment/Plan:  Assessment and Plan Assessment & Plan Parkinson's disease with dyskinesia and fluctuating manifestations Managed with carbidopa -levodopa . Experiences nausea and dizziness, particularly at 10 AM and 1 PM doses. Symptoms may be related to medication timing and stomach emptying. - Prescribed ondansetron  4 mg dissolvable tablets as needed for nausea, up to every 8 hours. - Advised taking carbidopa -levodopa  with carbohydrates to mitigate nausea. - Encouraged increased water intake to aid medication absorption.  Essential hypertension Borderline hypertensive with a reading of 108/80 mmHg. Managed with lifestyle modifications. - Continue lifestyle modifications for blood pressure management.  Mixed hyperlipidemia Managed with ezetimibe  10 mg. - Ordered lab work to check cholesterol levels.  Gastroesophageal reflux disease GERD symptoms primarily at night. Current treatment with famotidine  is not effective. - Prescribed pantoprazole  40 mg daily, to be taken on an empty stomach, preferably in the evening. - Discontinue famotidine    Chronic kidney disease, stage 2 Stage 2 chronic kidney disease. - Ordered lab work to check kidney function.  Fatty liver Fatty liver. Recommend weight loss - Ordered lab work to check liver function.  Pre-diabetes Managed with lifestyle modifications, including a low-carb diet. - Ordered lab work to check blood sugar levels.  Class 2 obesity, BMI 35-35.9 Class 2 obesity with difficulty losing weight due to Parkinson's and back issues. Prefers natural weight loss methods. Declines medication or referral to surgery/nutrition - Encouraged continued healthy lifestyle and weight management efforts.  Vitamin D  deficiency Well Managed with calcifediol  5000 units daily. - Continue vitamin D  supplementation  Vitamin B12 deficiency Managed with methylcobalamin 1000 mcg daily. - Ordered lab work to check CBC w/ Difff/Plt  and B12 levels.  Chronic constipation secondary to medication Chronic constipation likely secondary to carbidopa -levodopa . Previous treatments with Miralax  and Linzess  were not well tolerated. - Encouraged increased dietary fiber intake, such as oats, and increased water intake.  Urinary retention, likely medication-induced Recent onset of urinary hesitancy, likely related to carbidopa -levodopa . Symptoms improved with increased water intake. - Encouraged increased water intake during the day and reduced intake before bedtime.  General health maintenance Routine health maintenance discussed, including lab work and screenings. - Ordered lab work for cholesterol, blood sugar, kidney function, liver function, and B12 levels. - Encouraged continued healthy lifestyle and weight management efforts.  Recording duration: 27 minutes      There are no discontinued medications.  Return in about 6 months (around 07/05/2025) for physical (fasting labs).        Subjective:   Encounter date: 01/05/2025  Jenna Miller is a 69 y.o. female who has Essential hypertension; Hyperlipidemia; GERD; Vitamin D  deficiency; Fatty liver; CKD Stage 2 (GFR 69 ml/min); Medication management; Pre-diabetes; Morbid Obesity (BMI 32.54); Breast cancer, Left; Chronic constipation; Dysfunction of right eustachian tube; History of adenomatous polyp of colon; Parkinson's disease (HCC); Arm pain; Chronic midline low back pain; Spinal stenosis of lumbosacral region; Class 2 severe obesity due to excess calories with serious comorbidity and body mass index (BMI) of 35.0 to 35.9 in adult; Drug-induced nausea and vomiting; Abnormal glucose; B12 deficiency; and Urinary retention on their problem list..   She  has a past medical history of Acid reflux, Arthritis, Cancer (HCC), Cataract, Complication of anesthesia, Fatty liver, High cholesterol, Hypertension, Spider bite (2016), and Vitamin D  deficiency..   She presents with  chief complaint of Medical Management of Chronic Issues (Pt presents today for HLD and fasting labs. Pt is currently fasting. Pt states when she has to go to the  bathroom, she feels she has to go but doesn't acutually pee when its time.) .   Discussed the use of AI scribe software for clinical note transcription with the patient, who gave verbal consent to proceed.  History of Present Illness Jenna Miller is a 69 year old female with Parkinson's disease who presents for follow-up.  Urinary hesitancy and nocturia - Urinary hesitancy and difficulty initiating urination for the past 1-2 weeks - Symptoms attributed to decreased water intake due to busy work schedule - Increasing water intake improves urinary symptoms - No dysuria - Increased nocturnal urinary frequency  Ambulates with cane due to parkinson's diease  Medication side effects (carbidopa -levodopa ) - Takes carbidopa -levodopa  four times daily at 7 AM, 10 AM, 1 PM, and 4 PM for Parkinson's disease - Nausea and dizziness occur primarily after 10 AM and 1 PM doses - Drinks 80 ounces of water daily to aid medication absorption and reduce nausea, but recently decreased to 50 ounces - Uses crackers to help mitigate nausea  Gastroesophageal reflux disease (gerd) - History of GERD - Takes famotidine , but it is less effective at night - Heartburn occurs primarily when lying down - No trial of other medications for GERD  Chronic constipation - Chronic constipation associated with medications - Previous use of Miralax  and Linzess  resulted in adverse effects - Occasional use of over-the-counter stool softeners, but not always effective - Consumes Greek yogurt with fruit daily - Considering increasing fiber intake  Weight gain and physical activity limitation - Recent weight increase to 185 pounds, up from 182 pounds in September - BMI is 35 - Difficulty losing weight due to back condition and Parkinson's disease, which limit  ability to exercise - Concerned about weight and its impact on health  Chronic medical conditions and management - Hypertension and prediabetes managed with a low-carb, heart-healthy diet - Takes calcifediol  5000 units daily for vitamin D  deficiency - Takes methylcobalamin 1000 mg for vitamin B12 deficiency - Takes ezetimibe  for hyperlipidemia - Stage 2 chronic kidney disease     ROS  Past Surgical History:  Procedure Laterality Date   BREAST SURGERY  1995   mastectomy left   CHOLECYSTECTOMY  11/19/2012   Procedure: LAPAROSCOPIC CHOLECYSTECTOMY WITH INTRAOPERATIVE CHOLANGIOGRAM;  Surgeon: Dann FORBES Hummer, MD;  Location: [facility] OR;  Service: General;  Laterality: N/A;   COLONOSCOPY     HERNIA REPAIR     KNEE ARTHROSCOPY  1982   MASTECTOMY Left 1995   SPINE SURGERY  04/04/2024   Cortisone epidural   TRAM  1999-2000   WISDOM TOOTH EXTRACTION      Outpatient Medications Prior to Visit  Medication Sig Dispense Refill   BIOTIN 5000 PO Take by mouth. Skin, hair and nails     carbidopa -levodopa  (SINEMET  IR) 25-100 MG tablet TAKE 1 TABLET BY MOUTH 4 (FOUR) TIMES DAILY. TAKE 1 AT 7/10/1/AND 4 360 tablet 00   Cholecalciferol (VITAMIN D3 PO) Take 5,000 mg by mouth daily.     ezetimibe  (ZETIA ) 10 MG tablet TAKE 1 TABLET BY MOUTH EVERY DAY FOR CHOLESTEROL 90 tablet 3   famotidine  (PEPCID ) 40 MG tablet TAKE 1 TABLET 2 X /DAY TO PREVENT HEARTBURN & INDIGESTION 180 tablet 3   gabapentin  (NEURONTIN ) 100 MG capsule Take 100 mg by mouth at bedtime as needed.     Methylcobalamin (B12-ACTIVE) 1 MG CHEW Chew 1 each by mouth daily.     polyethylene glycol (MIRALAX ) 17 g packet Take 17 g by mouth daily as needed for moderate  constipation. 30 each 0   No facility-administered medications prior to visit.    Family History  Problem Relation Age of Onset   Hypertension Mother    Hyperlipidemia Mother    Gout Father 77       trauma   COPD Sister    Hyperlipidemia Brother    Hypertension Brother     Cancer Cousin        lung   Colon cancer Neg Hx    Rectal cancer Neg Hx    Stomach cancer Neg Hx    Esophageal cancer Neg Hx     Social History   Socioeconomic History   Marital status: Divorced    Spouse name: Not on file   Number of children: Not on file   Years of education: Not on file   Highest education level: Some college, no degree  Occupational History   Not on file  Tobacco Use   Smoking status: Never   Smokeless tobacco: Never  Vaping Use   Vaping status: Never Used  Substance and Sexual Activity   Alcohol use: Not Currently    Alcohol/week: 1.0 standard drink of alcohol    Comment: rare   Drug use: No   Sexual activity: Not on file  Other Topics Concern   Not on file  Social History Narrative   Right handed    Social Drivers of Health   Tobacco Use: Low Risk (11/17/2024)   Patient History    Smoking Tobacco Use: Never    Smokeless Tobacco Use: Never    Passive Exposure: Not on file  Financial Resource Strain: Low Risk (01/02/2025)   Overall Financial Resource Strain (CARDIA)    Difficulty of Paying Living Expenses: Not hard at all  Food Insecurity: No Food Insecurity (01/02/2025)   Epic    Worried About Radiation Protection Practitioner of Food in the Last Year: Never true    Ran Out of Food in the Last Year: Never true  Transportation Needs: No Transportation Needs (01/02/2025)   Epic    Lack of Transportation (Medical): No    Lack of Transportation (Non-Medical): No  Physical Activity: Unknown (06/27/2024)   Exercise Vital Sign    Days of Exercise per Week: Patient declined    Minutes of Exercise per Session: Not on file  Stress: No Stress Concern Present (01/02/2025)   [employer] of Occupational Health - Occupational Stress Questionnaire    Feeling of Stress: Only a little  Social Connections: Unknown (01/02/2025)   Social Connection and Isolation Panel    Frequency of Communication with Friends and Family: More than three times a week    Frequency of Social  Gatherings with Friends and Family: Not on file    Attends Religious Services: Not on file    Active Member of Clubs or Organizations: No    Attends Banker Meetings: Not on file    Marital Status: Divorced  Intimate Partner Violence: Not on file  Depression (PHQ2-9): Low Risk (01/05/2025)   Depression (PHQ2-9)    PHQ-2 Score: 0  Alcohol Screen: Not on file  Housing: Unknown (01/02/2025)   Epic    Unable to Pay for Housing in the Last Year: No    Number of Times Moved in the Last Year: Not on file    Homeless in the Last Year: Not on file  Utilities: Not on file  Health Literacy: Not on file  Objective:  Physical Exam: BP 108/80   Pulse 85   Temp 98.1 F (36.7 C)   Ht 5' 1 (1.549 m)   Wt 185 lb 3.2 oz (84 kg)   LMP 06/10/2010   SpO2 98%   BMI 34.99 kg/m   Wt Readings from Last 3 Encounters:  01/05/25 185 lb 3.2 oz (84 kg)  11/17/24 175 lb (79.4 kg)  11/02/24 175 lb (79.4 kg)    Physical Exam VITALS: BP- 108/80 MEASUREMENTS: Weight- 185, BMI- 35.0. GENERAL: Alert, cooperative, well developed, no acute distress HEENT: Normocephalic, normal oropharynx, moist mucous membranes CHEST: Clear to auscultation bilaterally, no wheezes, rhonchi, or crackles CARDIOVASCULAR: Normal heart rate and rhythm, S1 and S2 normal without murmurs ABDOMEN: Soft, non-tender, non-distended, without organomegaly, normal bowel sounds EXTREMITIES: No cyanosis or edema NEUROLOGICAL: Cranial nerves grossly intact, moves all extremities without gross motor or sensory deficit   Physical Exam  No results found.  Recent Results (from the past 2160 hours)  Surgical pathology ([facility])     Status: None   Collection Time: 11/17/24 12:00 AM  Result Value Ref Range   SURGICAL PATHOLOGY      SURGICAL PATHOLOGY [facility] [address], [address] [place], [place]  [address] Telephone [phone] or [phone] Fax [phone]  REPORT OF SURGICAL PATHOLOGY   Accession #: [id number] Patient Name: Jenna Miller, Jenna Miller Visit # : [id number]  MRN: [id number] Physician: Legrand Shove DOB/Age [birth year] (Age: 21) Gender: F Collected Date: 11/17/2024 Received Date: 11/18/2024  FINAL DIAGNOSIS       1. Surgical [P], colon, cecum, polyp (1) :       - TUBULAR ADENOMA.       DATE SIGNED OUT: 11/21/2024 ELECTRONIC SIGNATURE : Coronel Md, Misti, Sports Administrator, International Aid/development Worker  MICROSCOPIC DESCRIPTION  CASE COMMENTS STAINS USED IN DIAGNOSIS: H&E    CLINICAL HISTORY  SPECIMEN(S) OBTAINED 1. Surgical [P], Colon, Cecum, Polyp (1)  SPECIMEN COMMENTS: 1. Hx of colonic polyps; benign neoplasm of cecum SPECIMEN CLINICAL INFORMATION: 1. R/O adenoma    Gross Description 1. Received in formalin are tan, soft tissue fragment s that are submitted in toto. Number: 1 Size: 0.6 cm, (1B) ( TA )        Report signed out from the following location(s) [place].  [facility] [address] [place], [place] [address] CLIA #: [id number]  [facility] [address] [place], [place] [address] CLIA #: [id number]         Beverley Adine Hummer, MD, MS "

## 2025-01-05 NOTE — Patient Instructions (Signed)
 It was very nice to see you today!  VISIT SUMMARY: You had a follow-up visit to discuss your Parkinson's disease and other health concerns. We addressed your urinary hesitancy, medication side effects, GERD, chronic constipation, weight gain, and other chronic conditions. Lab work was ordered to monitor your health, and adjustments were made to your treatment plan to help manage your symptoms more effectively.  YOUR PLAN: PARKINSON'S DISEASE WITH DYSKINESIA AND FLUCTUATING MANIFESTATIONS: You are experiencing nausea and dizziness, particularly after your 10 AM and 1 PM doses of carbidopa -levodopa . -Take ondansetron  4 mg dissolvable tablets as needed for nausea, up to every 8 hours. -Take carbidopa -levodopa  with carbohydrates to help reduce nausea. -Increase your water intake to help with medication absorption.  ESSENTIAL HYPERTENSION: Your blood pressure is borderline high. -Continue with your current lifestyle modifications to manage your blood pressure.  MIXED HYPERLIPIDEMIA: You are managing your cholesterol levels with ezetimibe . -Lab work has been ordered to check your cholesterol levels.  GASTROESOPHAGEAL REFLUX DISEASE (GERD): You have GERD symptoms, especially at night, and famotidine  is not effective. -Start taking pantoprazole  40 mg daily on an empty stomach, preferably in the evening.  CHRONIC KIDNEY DISEASE, STAGE 2: You have stage 2 chronic kidney disease. -Lab work has been ordered to check your kidney function.  FATTY LIVER: You have fatty liver. -Lab work has been ordered to check your liver function.  PRE-DIABETES: You are managing pre-diabetes with lifestyle modifications. -Lab work has been ordered to check your blood sugar levels.  CLASS 2 OBESITY, BMI 35-35.9: You have class 2 obesity and find it difficult to lose weight due to Parkinson's and back issues. -Continue with your healthy lifestyle and weight management efforts.  VITAMIN D  DEFICIENCY: You are managing  your vitamin D  deficiency with calcifediol  5000 units daily. -No need to recheck your vitamin D  levels at this time.  VITAMIN B12 DEFICIENCY: You are managing your vitamin B12 deficiency with methylcobalamin 1000 mcg daily. -Lab work has been ordered to check your B12 levels.  CHRONIC CONSTIPATION SECONDARY TO MEDICATION: You have chronic constipation likely due to your Parkinson's medication. -Increase your dietary fiber intake, such as oats, and increase your water intake.  URINARY HESITANCY, LIKELY MEDICATION-INDUCED: You have recent onset of urinary hesitancy, likely related to your Parkinson's medication. -Increase your water intake during the day and reduce intake before bedtime.  GENERAL HEALTH MAINTENANCE: Routine health maintenance was discussed, including lab work and screenings. -Lab work has been ordered for cholesterol, blood sugar, kidney function, liver function, and B12 levels. -Continue with your healthy lifestyle and weight management efforts.  No follow-ups on file.   Take care, Jenna Hummer, MD, MS   PLEASE NOTE:  If you had any lab tests, please let us  know if you have not heard back within a few days. You may see your results on mychart before we have a chance to review them but we will give you a call once they are reviewed by us .   If we ordered any referrals today, please let us  know if you have not heard from their office within the next week.   If you had any urgent prescriptions sent in today, please check with the pharmacy within an hour of our visit to make sure the prescription was transmitted appropriately.   Please try these tips to maintain a healthy lifestyle:  Eat at least 3 REAL meals and 1-2 snacks per day.  Aim for no more than 5 hours between eating.  If you eat breakfast, please  do so within one hour of getting up.   Each meal should contain half fruits/vegetables, one quarter protein, and one quarter carbs (no bigger than a computer  mouse)  Cut down on sweet beverages. This includes juice, soda, and sweet tea.   Drink at least 1 glass of water with each meal and aim for at least 8 glasses per day  Exercise at least 150 minutes every week.

## 2025-01-06 LAB — COMPLETE METABOLIC PANEL WITHOUT GFR
AG Ratio: 2.1 (calc) (ref 1.0–2.5)
ALT: 11 U/L (ref 6–29)
AST: 16 U/L (ref 10–35)
Albumin: 4.7 g/dL (ref 3.6–5.1)
Alkaline phosphatase (APISO): 57 U/L (ref 37–153)
BUN: 20 mg/dL (ref 7–25)
CO2: 26 mmol/L (ref 20–32)
Calcium: 9.4 mg/dL (ref 8.6–10.4)
Chloride: 104 mmol/L (ref 98–110)
Creat: 0.71 mg/dL (ref 0.50–1.05)
Globulin: 2.2 g/dL (ref 1.9–3.7)
Glucose, Bld: 94 mg/dL (ref 65–99)
Potassium: 4.3 mmol/L (ref 3.5–5.3)
Sodium: 139 mmol/L (ref 135–146)
Total Bilirubin: 0.5 mg/dL (ref 0.2–1.2)
Total Protein: 6.9 g/dL (ref 6.1–8.1)

## 2025-01-06 LAB — HEMOGLOBIN A1C: Hgb A1c MFr Bld: 5.7 % — ABNORMAL HIGH

## 2025-01-09 ENCOUNTER — Ambulatory Visit: Payer: Self-pay | Admitting: Family Medicine

## 2025-01-19 NOTE — Progress Notes (Unsigned)
 "   Assessment/Plan:   1.  Parkinsons Disease, diagnosed March, 2022  - Continue carbidopa /levodopa  25/100, 1 tablet at 7 AM/10 AM/1 PM/4 PM  -call me Monday and let me know how she does.  If she doesn't do well with either of the above options, we will use rytary  195 mg tid instead of IR levodopa .  -discussed exercise and exercises that are appropriate for her.  She wants to hold on PT and discuss it next time.  -we discussed DA and may need to consider adding this in the future  -mild Parkinsons Disease dyskinesia - not bothersome to patient and no medication necessary   2.  Constipation  -linzess  caused urgency/incontinence  3.  LBP  -she is doing better but not fully recovered and wants to hold on PT  -just had epidural injection and it helped.    4.  Insomnia  -start melatonin 3-9 mg OTC  -told her to stop taking nap after work (falling asleep watching TV)  Subjective:   Jenna Miller was seen today in follow up for Parkinsons disease.  My previous records were reviewed prior to todays visit as well as outside records available to me.  Last visit, patient was complaining about some nausea, but only at a specific time of day (around 10:15 AM).  I first told her to hold levodopa  for a few days and see if she had the nausea, and then told her instead to trial the levodopa  at 7 AM/10 AM/1 PM/4 PM as I wondered if the nausea was actually and off phenomenon.  When I had her take the medication this way, she initially reported to me that she did better, but I saw she recently saw her primary care physician and noted to him that she had nausea at the 10 AM and 1 PM dose so he gave her a prescription for Zofran .  Current prescribed movement disorder medications: Carbidopa /levodopa  25/100, 1 tablet 4 times per day at 7 AM/10 AM/1 PM/4 PM   PREVIOUS MEDICATIONS: Sinemet  (nausea)  ALLERGIES:   Allergies  Allergen Reactions   Effexor [Venlafaxine] Swelling and Other (See Comments)     Other reaction(s): Other (See Comments) Throat swelling   Lipitor [Atorvastatin] Anaphylaxis and Swelling    Other reaction(s): Other (See Comments) Unknown   Paxil [Paroxetine Hcl] Swelling    Other reaction(s): Other (See Comments) Throat swelling   Wellbutrin [Bupropion] Hives    CURRENT MEDICATIONS:  Outpatient Encounter Medications as of 01/24/2025  Medication Sig   BIOTIN 5000 PO Take by mouth. Skin, hair and nails   carbidopa -levodopa  (SINEMET  IR) 25-100 MG tablet TAKE 1 TABLET BY MOUTH 4 (FOUR) TIMES DAILY. TAKE 1 AT 7/10/1/AND 4   Cholecalciferol (VITAMIN D3 PO) Take 5,000 mg by mouth daily.   ezetimibe  (ZETIA ) 10 MG tablet TAKE 1 TABLET BY MOUTH EVERY DAY FOR CHOLESTEROL   famotidine  (PEPCID ) 40 MG tablet TAKE 1 TABLET 2 X /DAY TO PREVENT HEARTBURN & INDIGESTION   gabapentin  (NEURONTIN ) 100 MG capsule Take 100 mg by mouth at bedtime as needed.   Methylcobalamin (B12-ACTIVE) 1 MG CHEW Chew 1 each by mouth daily.   ondansetron  (ZOFRAN -ODT) 4 MG disintegrating tablet Take 1 tablet (4 mg total) by mouth every 8 (eight) hours as needed for nausea or vomiting.   pantoprazole  (PROTONIX ) 40 MG tablet Take daily 30 minutes before eating.   polyethylene glycol (MIRALAX ) 17 g packet Take 17 g by mouth daily as needed for moderate constipation.   No facility-administered  encounter medications on file as of 01/24/2025.    Objective:   PHYSICAL EXAMINATION:    VITALS:   There were no vitals filed for this visit.    GEN:  The patient appears stated age and is in NAD. HEENT:  Normocephalic, atraumatic.  The mucous membranes are moist. The superficial temporal arteries are without ropiness or tenderness. CV:  RRR Lungs:  CTAB Neck/HEME:  There are no carotid bruits bilaterally.  Neurological examination:  Orientation: The patient is alert and oriented x3. Cranial nerves: There is good facial symmetry with facial hypomimia. The speech is fluent and clear. Soft palate rises  symmetrically and there is no tongue deviation. Hearing is intact to conversational tone. Sensation: Sensation is intact to light touch throughout Motor: Strength is at least antigravity x4.  Movement examination: Tone: There is nl tone in the UE/LE Abnormal movements:there is rare dyskinesia in the L foot Coordination:  There is mild decremation on the L with finger taps, toe taps and hand opening and closing Gait and Station: The patient has difficulty arising out of a deep-seated chair without the use of the hands. The patient's stride length is antalgic (has to do with her back and recent epidural)   I have reviewed and interpreted the following labs independently    Chemistry      Component Value Date/Time   NA 139 01/05/2025 0920   K 4.3 01/05/2025 0920   CL 104 01/05/2025 0920   CO2 26 01/05/2025 0920   BUN 20 01/05/2025 0920   CREATININE 0.71 01/05/2025 0920      Component Value Date/Time   CALCIUM  9.4 01/05/2025 0920   ALKPHOS 42 02/26/2024 0919   AST 16 01/05/2025 0920   ALT 11 01/05/2025 0920   BILITOT 0.5 01/05/2025 0920       Lab Results  Component Value Date   WBC 6.5 01/05/2025   HGB 12.4 01/05/2025   HCT 37.2 01/05/2025   MCV 89.8 01/05/2025   PLT 241.0 01/05/2025    Lab Results  Component Value Date   TSH 1.22 06/28/2024    Total time spent on today's visit was *** minutes, including both face-to-face time and nonface-to-face time.  Time included that spent on review of records (prior notes available to me/labs/imaging if pertinent), discussing treatment and goals, answering patient's questions and coordinating care.    Cc:  Sebastian Beverley NOVAK, MD "

## 2025-01-20 ENCOUNTER — Telehealth: Payer: Self-pay | Admitting: Neurology

## 2025-01-20 ENCOUNTER — Encounter: Payer: Self-pay | Admitting: Neurology

## 2025-01-20 NOTE — Telephone Encounter (Signed)
 Patient moved appt from 01-24-25 due to the weather and was scheduled out until June. Please advise if this is ok for patient to wait until June or is this something that is needing to be seen sooner

## 2025-01-24 ENCOUNTER — Ambulatory Visit: Admitting: Neurology

## 2025-03-01 ENCOUNTER — Telehealth: Payer: Self-pay | Admitting: Neurology

## 2025-06-02 ENCOUNTER — Ambulatory Visit: Admitting: Neurology

## 2025-07-05 ENCOUNTER — Encounter: Admitting: Family Medicine

## 2025-07-10 ENCOUNTER — Encounter (INDEPENDENT_AMBULATORY_CARE_PROVIDER_SITE_OTHER): Admitting: Ophthalmology
